# Patient Record
Sex: Female | Born: 1940 | Race: White | Hispanic: No | Marital: Married | Smoking: Never smoker
Health system: Southern US, Community
[De-identification: ages and names within clinical notes are randomized; demographics above are authoritative.]

## PROBLEM LIST (undated history)

## (undated) DIAGNOSIS — K219 Gastro-esophageal reflux disease without esophagitis: Secondary | ICD-10-CM

## (undated) DIAGNOSIS — G43909 Migraine, unspecified, not intractable, without status migrainosus: Secondary | ICD-10-CM

## (undated) DIAGNOSIS — G250 Essential tremor: Secondary | ICD-10-CM

## (undated) DIAGNOSIS — C8590 Non-Hodgkin lymphoma, unspecified, unspecified site: Secondary | ICD-10-CM

## (undated) DIAGNOSIS — J45909 Unspecified asthma, uncomplicated: Secondary | ICD-10-CM

## (undated) DIAGNOSIS — Z860100 Personal history of colon polyps, unspecified: Secondary | ICD-10-CM

## (undated) DIAGNOSIS — C4490 Unspecified malignant neoplasm of skin, unspecified: Secondary | ICD-10-CM

## (undated) DIAGNOSIS — J42 Unspecified chronic bronchitis: Secondary | ICD-10-CM

## (undated) DIAGNOSIS — C439 Malignant melanoma of skin, unspecified: Secondary | ICD-10-CM

## (undated) DIAGNOSIS — I10 Essential (primary) hypertension: Secondary | ICD-10-CM

## (undated) DIAGNOSIS — K2270 Barrett's esophagus without dysplasia: Secondary | ICD-10-CM

## (undated) DIAGNOSIS — J309 Allergic rhinitis, unspecified: Secondary | ICD-10-CM

## (undated) DIAGNOSIS — M1990 Unspecified osteoarthritis, unspecified site: Secondary | ICD-10-CM

## (undated) DIAGNOSIS — B009 Herpesviral infection, unspecified: Secondary | ICD-10-CM

## (undated) DIAGNOSIS — E785 Hyperlipidemia, unspecified: Secondary | ICD-10-CM

## (undated) DIAGNOSIS — C8299 Follicular lymphoma, unspecified, extranodal and solid organ sites: Secondary | ICD-10-CM

## (undated) DIAGNOSIS — T7840XA Allergy, unspecified, initial encounter: Secondary | ICD-10-CM

## (undated) HISTORY — PX: FACIAL COSMETIC SURGERY: SHX629

## (undated) HISTORY — PX: OOPHORECTOMY: SHX86

## (undated) HISTORY — DX: Unspecified osteoarthritis, unspecified site: M19.90

## (undated) HISTORY — DX: Migraine, unspecified, not intractable, without status migrainosus: G43.909

## (undated) HISTORY — DX: Herpesviral infection, unspecified: B00.9

## (undated) HISTORY — PX: ABDOMINAL HYSTERECTOMY: SHX81

## (undated) HISTORY — PX: BASAL CELL CARCINOMA EXCISION: SHX1214

## (undated) HISTORY — PX: CHOLECYSTECTOMY: SHX55

## (undated) HISTORY — DX: Non-Hodgkin lymphoma, unspecified, unspecified site: C85.90

## (undated) HISTORY — PX: OTHER SURGICAL HISTORY: SHX169

## (undated) HISTORY — DX: Barrett's esophagus without dysplasia: K22.70

## (undated) HISTORY — DX: Allergy, unspecified, initial encounter: T78.40XA

## (undated) HISTORY — PX: APPENDECTOMY: SHX54

## (undated) HISTORY — DX: Follicular lymphoma, unspecified, extranodal and solid organ sites: C82.99

## (undated) HISTORY — DX: Unspecified malignant neoplasm of skin, unspecified: C44.90

---

## 2004-07-10 ENCOUNTER — Emergency Department: Payer: Self-pay | Admitting: Emergency Medicine

## 2004-08-18 ENCOUNTER — Ambulatory Visit: Payer: Self-pay | Admitting: Unknown Physician Specialty

## 2004-11-01 ENCOUNTER — Ambulatory Visit: Payer: Self-pay | Admitting: Otolaryngology

## 2005-05-05 ENCOUNTER — Ambulatory Visit: Payer: Self-pay | Admitting: Otolaryngology

## 2005-08-22 ENCOUNTER — Ambulatory Visit: Payer: Self-pay | Admitting: Unknown Physician Specialty

## 2005-09-28 ENCOUNTER — Ambulatory Visit: Payer: Self-pay | Admitting: Gastroenterology

## 2005-10-15 ENCOUNTER — Emergency Department: Payer: Self-pay | Admitting: General Practice

## 2006-10-09 ENCOUNTER — Ambulatory Visit: Payer: Self-pay | Admitting: Unknown Physician Specialty

## 2006-11-12 ENCOUNTER — Ambulatory Visit: Payer: Self-pay | Admitting: Gastroenterology

## 2006-11-19 ENCOUNTER — Ambulatory Visit: Payer: Self-pay | Admitting: Gastroenterology

## 2006-11-21 ENCOUNTER — Ambulatory Visit: Payer: Self-pay | Admitting: Unknown Physician Specialty

## 2007-10-24 ENCOUNTER — Ambulatory Visit: Payer: Self-pay | Admitting: Unknown Physician Specialty

## 2008-11-04 ENCOUNTER — Ambulatory Visit: Payer: Self-pay | Admitting: Unknown Physician Specialty

## 2008-12-03 ENCOUNTER — Ambulatory Visit: Payer: Self-pay | Admitting: Gastroenterology

## 2009-09-29 ENCOUNTER — Ambulatory Visit: Payer: Self-pay

## 2009-11-11 ENCOUNTER — Ambulatory Visit: Payer: Self-pay | Admitting: Unknown Physician Specialty

## 2010-11-30 ENCOUNTER — Ambulatory Visit: Payer: Self-pay | Admitting: Unknown Physician Specialty

## 2011-02-20 ENCOUNTER — Ambulatory Visit: Payer: Self-pay | Admitting: Gastroenterology

## 2011-03-26 ENCOUNTER — Inpatient Hospital Stay: Payer: Self-pay | Admitting: Surgery

## 2011-04-27 ENCOUNTER — Ambulatory Visit: Payer: Self-pay | Admitting: Internal Medicine

## 2011-05-04 ENCOUNTER — Ambulatory Visit: Payer: Self-pay | Admitting: Internal Medicine

## 2011-05-05 ENCOUNTER — Ambulatory Visit: Payer: Self-pay | Admitting: Surgery

## 2011-05-09 ENCOUNTER — Ambulatory Visit: Payer: Self-pay | Admitting: Oncology

## 2011-05-15 ENCOUNTER — Ambulatory Visit: Payer: Self-pay | Admitting: Internal Medicine

## 2011-05-16 ENCOUNTER — Ambulatory Visit: Payer: Self-pay | Admitting: Internal Medicine

## 2011-05-16 LAB — URINALYSIS, COMPLETE
Ketone: NEGATIVE
Leukocyte Esterase: NEGATIVE
Protein: NEGATIVE
RBC,UR: <1 /HPF (ref 0–5)
Specific Gravity: 1.006 (ref 1.003–1.030)
Squamous Epithelial: NONE SEEN
WBC UR: <1 /HPF (ref 0–5)

## 2011-05-23 LAB — URINALYSIS, COMPLETE
Bacteria: NONE SEEN
Bilirubin,UR: NEGATIVE
Blood: NEGATIVE
Ketone: NEGATIVE
Nitrite: NEGATIVE
Ph: 6 (ref 4.5–8.0)
Protein: NEGATIVE
RBC,UR: 1 /HPF (ref 0–5)
Specific Gravity: 1.002 (ref 1.003–1.030)
Squamous Epithelial: <1
Transitional Epi: <1
WBC UR: 2 /HPF (ref 0–5)

## 2011-06-08 ENCOUNTER — Inpatient Hospital Stay: Payer: Self-pay | Admitting: Surgery

## 2011-06-09 ENCOUNTER — Ambulatory Visit: Payer: Self-pay | Admitting: Oncology

## 2011-06-09 LAB — URINALYSIS, COMPLETE
Blood: NEGATIVE
Glucose,UR: NEGATIVE mg/dL (ref 0–75)
Ketone: NEGATIVE
Leukocyte Esterase: NEGATIVE
Nitrite: NEGATIVE
Ph: 6 (ref 4.5–8.0)
Protein: NEGATIVE
RBC,UR: <1 /HPF (ref 0–5)
Specific Gravity: 1.003 (ref 1.003–1.030)
WBC UR: 4 /HPF (ref 0–5)

## 2011-06-09 LAB — CBC WITH DIFFERENTIAL/PLATELET
Basophil %: 0.1 %
Eosinophil #: 0 10*3/uL (ref 0.0–0.7)
Eosinophil %: 0.2 %
HCT: 40 % (ref 35.0–47.0)
HGB: 13.6 g/dL (ref 12.0–16.0)
Lymphocyte #: 0.9 10*3/uL — ABNORMAL LOW (ref 1.0–3.6)
MCH: 30.7 pg (ref 26.0–34.0)
MCHC: 34.1 g/dL (ref 32.0–36.0)
MCV: 90 fL (ref 80–100)
Monocyte #: 0.5 10*3/uL (ref 0.0–0.7)
Neutrophil #: 6.6 10*3/uL — ABNORMAL HIGH (ref 1.4–6.5)
RBC: 4.45 10*6/uL (ref 3.80–5.20)
WBC: 8 10*3/uL (ref 3.6–11.0)

## 2011-06-13 ENCOUNTER — Ambulatory Visit: Payer: Self-pay | Admitting: Internal Medicine

## 2011-06-15 LAB — PATHOLOGY REPORT

## 2011-06-27 LAB — CBC CANCER CENTER
Comment - H1-Com1: NORMAL
Eosinophil #: 0.1 x10 3/mm (ref 0.0–0.7)
Eosinophil %: 2 %
Eosinophil: 3 %
HGB: 14 g/dL (ref 12.0–16.0)
Lymphocyte #: 2.2 x10 3/mm (ref 1.0–3.6)
Lymphocyte %: 46.7 %
Lymphocytes: 51 %
MCHC: 34.3 g/dL (ref 32.0–36.0)
Monocyte #: 0.5 x10 3/mm (ref 0.0–0.7)
Monocytes: 11 %
Neutrophil %: 39.7 %
Platelet: 354 x10 3/mm (ref 150–440)
RBC: 4.59 10*6/uL (ref 3.80–5.20)
RDW: 13.1 % (ref 11.5–14.5)
Segmented Neutrophils: 35 %

## 2011-07-07 ENCOUNTER — Ambulatory Visit: Payer: Self-pay | Admitting: Internal Medicine

## 2011-07-07 ENCOUNTER — Ambulatory Visit: Payer: Self-pay | Admitting: Oncology

## 2011-07-11 LAB — COMPREHENSIVE METABOLIC PANEL
Albumin: 4 g/dL (ref 3.4–5.0)
Alkaline Phosphatase: 77 U/L (ref 50–136)
Calcium, Total: 9.2 mg/dL (ref 8.5–10.1)
Chloride: 103 mmol/L (ref 98–107)
Co2: 30 mmol/L (ref 21–32)
EGFR (Non-African Amer.): >60
Osmolality: 284 (ref 275–301)
Potassium: 4 mmol/L (ref 3.5–5.1)
SGOT(AST): 19 U/L (ref 15–37)
SGPT (ALT): 33 U/L

## 2011-07-11 LAB — CBC CANCER CENTER
Basophil #: 0 x10 3/mm (ref 0.0–0.1)
Basophil %: 0.7 %
Eosinophil #: 0.1 x10 3/mm (ref 0.0–0.7)
HCT: 41.7 % (ref 35.0–47.0)
HGB: 14.3 g/dL (ref 12.0–16.0)
Lymphocyte %: 47.2 %
MCHC: 34.4 g/dL (ref 32.0–36.0)
Monocyte %: 12 %
Neutrophil #: 1.8 x10 3/mm (ref 1.4–6.5)
Neutrophil %: 38.4 %
RBC: 4.68 10*6/uL (ref 3.80–5.20)
RDW: 13.9 % (ref 11.5–14.5)
WBC: 4.7 x10 3/mm (ref 3.6–11.0)

## 2011-07-18 LAB — CBC CANCER CENTER
Eosinophil #: 0.1 x10 3/mm (ref 0.0–0.7)
Eosinophil %: 2.8 %
HCT: 39.7 % (ref 35.0–47.0)
Lymphocyte #: 1.7 x10 3/mm (ref 1.0–3.6)
Lymphocyte %: 38.2 %
MCH: 30.8 pg (ref 26.0–34.0)
MCHC: 35 g/dL (ref 32.0–36.0)
MCV: 88 fL (ref 80–100)
Monocyte #: 0.4 x10 3/mm (ref 0.0–0.7)
Monocyte %: 9.6 %
Neutrophil #: 2.1 x10 3/mm (ref 1.4–6.5)
Platelet: 275 x10 3/mm (ref 150–440)
RBC: 4.52 10*6/uL (ref 3.80–5.20)
RDW: 13.4 % (ref 11.5–14.5)
WBC: 4.4 x10 3/mm (ref 3.6–11.0)

## 2011-07-25 LAB — CBC CANCER CENTER
Basophil #: 0 x10 3/mm (ref 0.0–0.1)
Eosinophil %: 1.7 %
Lymphocyte #: 1.9 x10 3/mm (ref 1.0–3.6)
Lymphocyte %: 39.1 %
MCH: 30.3 pg (ref 26.0–34.0)
MCHC: 34.6 g/dL (ref 32.0–36.0)
MCV: 88 fL (ref 80–100)
Monocyte #: 0.5 x10 3/mm (ref 0.0–0.7)
Monocyte %: 10.7 %
Neutrophil %: 47.6 %
Platelet: 301 x10 3/mm (ref 150–440)
RDW: 13.9 % (ref 11.5–14.5)

## 2011-08-01 LAB — BASIC METABOLIC PANEL
Anion Gap: 10 (ref 7–16)
BUN: 13 mg/dL (ref 7–18)
Calcium, Total: 9 mg/dL (ref 8.5–10.1)
Chloride: 106 mmol/L (ref 98–107)
Creatinine: 0.8 mg/dL (ref 0.60–1.30)
EGFR (African American): >60
Glucose: 124 mg/dL — ABNORMAL HIGH (ref 65–99)
Sodium: 140 mmol/L (ref 136–145)

## 2011-08-01 LAB — CBC CANCER CENTER
Basophil #: 0 x10 3/mm (ref 0.0–0.1)
Basophil %: 0.8 %
Lymphocyte #: 1.7 x10 3/mm (ref 1.0–3.6)
Lymphocyte %: 39.4 %
MCH: 30.1 pg (ref 26.0–34.0)
MCHC: 34.5 g/dL (ref 32.0–36.0)
Monocyte #: 0.5 x10 3/mm (ref 0.0–0.7)
Monocyte %: 11.4 %
Neutrophil #: 2 x10 3/mm (ref 1.4–6.5)
Neutrophil %: 46.1 %
RBC: 4.64 10*6/uL (ref 3.80–5.20)
RDW: 14.2 % (ref 11.5–14.5)

## 2011-08-07 ENCOUNTER — Ambulatory Visit: Payer: Self-pay | Admitting: Internal Medicine

## 2011-08-07 ENCOUNTER — Ambulatory Visit: Payer: Self-pay | Admitting: Oncology

## 2011-11-28 ENCOUNTER — Ambulatory Visit: Payer: Self-pay | Admitting: Oncology

## 2011-11-30 ENCOUNTER — Ambulatory Visit: Payer: Self-pay | Admitting: Oncology

## 2011-11-30 LAB — CBC CANCER CENTER
Basophil #: 0 x10 3/mm (ref 0.0–0.1)
Eosinophil #: 0.1 x10 3/mm (ref 0.0–0.7)
Lymphocyte #: 2 x10 3/mm (ref 1.0–3.6)
Lymphocyte %: 38.2 %
MCHC: 34.9 g/dL (ref 32.0–36.0)
MCV: 88 fL (ref 80–100)
Monocyte #: 0.8 x10 3/mm (ref 0.2–0.9)
Monocyte %: 15.4 %
Neutrophil #: 2.2 x10 3/mm (ref 1.4–6.5)
Neutrophil %: 43.3 %
Platelet: 319 x10 3/mm (ref 150–440)
RBC: 4.79 10*6/uL (ref 3.80–5.20)
RDW: 13.4 % (ref 11.5–14.5)
WBC: 5.2 x10 3/mm (ref 3.6–11.0)

## 2011-11-30 LAB — COMPREHENSIVE METABOLIC PANEL
Albumin: 3.9 g/dL (ref 3.4–5.0)
Alkaline Phosphatase: 79 U/L (ref 50–136)
Anion Gap: 7 (ref 7–16)
BUN: 14 mg/dL (ref 7–18)
Creatinine: 0.91 mg/dL (ref 0.60–1.30)
Osmolality: 283 (ref 275–301)
SGPT (ALT): 39 U/L
Total Protein: 7.4 g/dL (ref 6.4–8.2)

## 2011-11-30 LAB — LACTATE DEHYDROGENASE: LDH: 142 U/L (ref 81–234)

## 2011-11-30 LAB — SEDIMENTATION RATE: Erythrocyte Sed Rate: 7 mm/hr (ref 0–30)

## 2011-12-02 ENCOUNTER — Emergency Department: Payer: Self-pay | Admitting: Emergency Medicine

## 2011-12-02 LAB — COMPREHENSIVE METABOLIC PANEL
Albumin: 4.3 g/dL (ref 3.4–5.0)
Alkaline Phosphatase: 82 U/L (ref 50–136)
Bilirubin,Total: 0.4 mg/dL (ref 0.2–1.0)
Co2: 26 mmol/L (ref 21–32)
Creatinine: 0.95 mg/dL (ref 0.60–1.30)
EGFR (African American): >60
EGFR (Non-African Amer.): >60
Osmolality: 280 (ref 275–301)
Potassium: 3.5 mmol/L (ref 3.5–5.1)
SGPT (ALT): 37 U/L

## 2011-12-02 LAB — CBC WITH DIFFERENTIAL/PLATELET
Eosinophil #: 0.1 10*3/uL (ref 0.0–0.7)
Eosinophil %: 0.9 %
HCT: 41.3 % (ref 35.0–47.0)
MCH: 30.5 pg (ref 26.0–34.0)
MCHC: 35.2 g/dL (ref 32.0–36.0)
MCV: 87 fL (ref 80–100)
Monocyte #: 0.7 x10 3/mm (ref 0.2–0.9)
Monocyte %: 8.4 %
Neutrophil #: 6.1 10*3/uL (ref 1.4–6.5)
Neutrophil %: 69.7 %
Platelet: 307 10*3/uL (ref 150–440)
RDW: 13 % (ref 11.5–14.5)

## 2011-12-05 ENCOUNTER — Ambulatory Visit: Payer: Self-pay | Admitting: Physician Assistant

## 2011-12-07 ENCOUNTER — Ambulatory Visit: Payer: Self-pay | Admitting: Oncology

## 2011-12-22 ENCOUNTER — Emergency Department: Payer: Self-pay | Admitting: *Deleted

## 2012-01-07 ENCOUNTER — Ambulatory Visit: Payer: Self-pay | Admitting: Oncology

## 2012-02-01 LAB — COMPREHENSIVE METABOLIC PANEL
Alkaline Phosphatase: 77 U/L (ref 50–136)
Anion Gap: 8 (ref 7–16)
Bilirubin,Total: 0.4 mg/dL (ref 0.2–1.0)
Chloride: 103 mmol/L (ref 98–107)
Co2: 28 mmol/L (ref 21–32)
Creatinine: 0.97 mg/dL (ref 0.60–1.30)
EGFR (African American): >60
EGFR (Non-African Amer.): 59 — ABNORMAL LOW
Potassium: 3.8 mmol/L (ref 3.5–5.1)
SGOT(AST): 33 U/L (ref 15–37)
SGPT (ALT): 45 U/L (ref 12–78)
Sodium: 139 mmol/L (ref 136–145)
Total Protein: 7.6 g/dL (ref 6.4–8.2)

## 2012-02-01 LAB — CBC CANCER CENTER
Basophil #: 0 x10 3/mm (ref 0.0–0.1)
Basophil %: 0.9 %
Eosinophil %: 2 %
HGB: 14.7 g/dL (ref 12.0–16.0)
Lymphocyte #: 1.7 x10 3/mm (ref 1.0–3.6)
MCH: 29.2 pg (ref 26.0–34.0)
MCV: 89 fL (ref 80–100)
Monocyte #: 0.6 x10 3/mm (ref 0.2–0.9)
Monocyte %: 15 %
Neutrophil %: 37.6 %
RBC: 5.02 10*6/uL (ref 3.80–5.20)
WBC: 3.8 x10 3/mm (ref 3.6–11.0)

## 2012-02-06 ENCOUNTER — Ambulatory Visit: Payer: Self-pay | Admitting: Oncology

## 2012-03-20 DIAGNOSIS — K1120 Sialoadenitis, unspecified: Secondary | ICD-10-CM | POA: Insufficient documentation

## 2012-03-20 DIAGNOSIS — M26609 Unspecified temporomandibular joint disorder, unspecified side: Secondary | ICD-10-CM | POA: Insufficient documentation

## 2012-04-11 ENCOUNTER — Ambulatory Visit: Payer: Self-pay | Admitting: Oncology

## 2012-04-11 LAB — CBC CANCER CENTER
Eosinophil #: 0.1 x10 3/mm (ref 0.0–0.7)
Eosinophil %: 3.4 %
HCT: 41.1 % (ref 35.0–47.0)
HGB: 14.3 g/dL (ref 12.0–16.0)
Lymphocyte %: 35 %
MCH: 30.3 pg (ref 26.0–34.0)
MCV: 87 fL (ref 80–100)
Monocyte %: 14.7 %
Neutrophil %: 45.7 %
Platelet: 307 x10 3/mm (ref 150–440)
RBC: 4.73 10*6/uL (ref 3.80–5.20)
RDW: 13.5 % (ref 11.5–14.5)

## 2012-04-11 LAB — COMPREHENSIVE METABOLIC PANEL
Albumin: 3.7 g/dL (ref 3.4–5.0)
Anion Gap: 10 (ref 7–16)
BUN: 13 mg/dL (ref 7–18)
Bilirubin,Total: 0.3 mg/dL (ref 0.2–1.0)
Chloride: 106 mmol/L (ref 98–107)
Creatinine: 0.9 mg/dL (ref 0.60–1.30)
Glucose: 125 mg/dL — ABNORMAL HIGH (ref 65–99)
Potassium: 4 mmol/L (ref 3.5–5.1)
SGOT(AST): 24 U/L (ref 15–37)
SGPT (ALT): 41 U/L (ref 12–78)
Sodium: 141 mmol/L (ref 136–145)
Total Protein: 7 g/dL (ref 6.4–8.2)

## 2012-04-11 LAB — LACTATE DEHYDROGENASE: LDH: 142 U/L (ref 81–246)

## 2012-05-08 ENCOUNTER — Ambulatory Visit: Payer: Self-pay | Admitting: Oncology

## 2012-06-08 ENCOUNTER — Ambulatory Visit: Payer: Self-pay | Admitting: Oncology

## 2012-06-13 LAB — CBC CANCER CENTER
Basophil #: 0.1 x10 3/mm (ref 0.0–0.1)
Basophil %: 1.1 %
Eosinophil #: 0.1 x10 3/mm (ref 0.0–0.7)
Eosinophil %: 1.8 %
HGB: 14.9 g/dL (ref 12.0–16.0)
Lymphocyte #: 1.6 x10 3/mm (ref 1.0–3.6)
MCHC: 34.7 g/dL (ref 32.0–36.0)
Monocyte #: 0.5 x10 3/mm (ref 0.2–0.9)
Monocyte %: 10 %
Neutrophil #: 3 x10 3/mm (ref 1.4–6.5)
Neutrophil %: 56.2 %
Platelet: 290 x10 3/mm (ref 150–440)

## 2012-06-13 LAB — COMPREHENSIVE METABOLIC PANEL
Albumin: 3.9 g/dL (ref 3.4–5.0)
Alkaline Phosphatase: 79 U/L (ref 50–136)
BUN: 14 mg/dL (ref 7–18)
Bilirubin,Total: 0.5 mg/dL (ref 0.2–1.0)
Creatinine: 0.83 mg/dL (ref 0.60–1.30)
EGFR (African American): >60
EGFR (Non-African Amer.): >60
Osmolality: 285 (ref 275–301)
Sodium: 142 mmol/L (ref 136–145)

## 2012-07-06 ENCOUNTER — Ambulatory Visit: Payer: Self-pay | Admitting: Oncology

## 2012-08-06 ENCOUNTER — Ambulatory Visit: Payer: Self-pay | Admitting: Oncology

## 2012-08-13 ENCOUNTER — Ambulatory Visit: Payer: Self-pay | Admitting: Oncology

## 2012-08-15 LAB — COMPREHENSIVE METABOLIC PANEL
Albumin: 3.8 g/dL (ref 3.4–5.0)
Alkaline Phosphatase: 84 U/L (ref 50–136)
BUN: 16 mg/dL (ref 7–18)
Co2: 30 mmol/L (ref 21–32)
Creatinine: 1.04 mg/dL (ref 0.60–1.30)
EGFR (Non-African Amer.): 54 — ABNORMAL LOW
Glucose: 127 mg/dL — ABNORMAL HIGH (ref 65–99)
Potassium: 3.9 mmol/L (ref 3.5–5.1)
Sodium: 141 mmol/L (ref 136–145)
Total Protein: 7.3 g/dL (ref 6.4–8.2)

## 2012-08-15 LAB — CBC CANCER CENTER
Basophil #: 0.1 x10 3/mm (ref 0.0–0.1)
Basophil %: 1.2 %
Eosinophil #: 0.1 x10 3/mm (ref 0.0–0.7)
Eosinophil %: 2.2 %
HCT: 43.3 % (ref 35.0–47.0)
HGB: 14.6 g/dL (ref 12.0–16.0)
Lymphocyte #: 1.8 x10 3/mm (ref 1.0–3.6)
Lymphocyte %: 39.2 %
MCH: 29.2 pg (ref 26.0–34.0)
MCHC: 33.8 g/dL (ref 32.0–36.0)
Monocyte #: 0.6 x10 3/mm (ref 0.2–0.9)
Neutrophil #: 2 x10 3/mm (ref 1.4–6.5)
Platelet: 295 x10 3/mm (ref 150–440)
RBC: 5 10*6/uL (ref 3.80–5.20)
RDW: 13.3 % (ref 11.5–14.5)
WBC: 4.5 x10 3/mm (ref 3.6–11.0)

## 2012-08-23 ENCOUNTER — Emergency Department: Payer: Self-pay | Admitting: Emergency Medicine

## 2012-08-23 LAB — CBC
HGB: 15.7 g/dL (ref 12.0–16.0)
MCH: 30.2 pg (ref 26.0–34.0)
MCHC: 34.7 g/dL (ref 32.0–36.0)
MCV: 87 fL (ref 80–100)
Platelet: 296 10*3/uL (ref 150–440)
RBC: 5.19 10*6/uL (ref 3.80–5.20)
RDW: 13.1 % (ref 11.5–14.5)

## 2012-08-23 LAB — URINALYSIS, COMPLETE
Bilirubin,UR: NEGATIVE
Ketone: NEGATIVE
Nitrite: POSITIVE
Ph: 5 (ref 4.5–8.0)
RBC,UR: 17 /HPF (ref 0–5)
Specific Gravity: 1.006 (ref 1.003–1.030)
Squamous Epithelial: 2

## 2012-08-23 LAB — COMPREHENSIVE METABOLIC PANEL
Alkaline Phosphatase: 78 U/L (ref 50–136)
BUN: 11 mg/dL (ref 7–18)
Calcium, Total: 8.8 mg/dL (ref 8.5–10.1)
Chloride: 105 mmol/L (ref 98–107)
Creatinine: 0.77 mg/dL (ref 0.60–1.30)
Glucose: 107 mg/dL — ABNORMAL HIGH (ref 65–99)
Osmolality: 270 (ref 275–301)
Potassium: 3.8 mmol/L (ref 3.5–5.1)
SGPT (ALT): 41 U/L (ref 12–78)
Sodium: 135 mmol/L — ABNORMAL LOW (ref 136–145)
Total Protein: 7.4 g/dL (ref 6.4–8.2)

## 2012-08-23 LAB — LIPASE, BLOOD: Lipase: 115 U/L (ref 73–393)

## 2012-08-27 LAB — STOOL CULTURE

## 2012-09-05 ENCOUNTER — Ambulatory Visit: Payer: Self-pay | Admitting: Oncology

## 2012-10-06 ENCOUNTER — Ambulatory Visit: Payer: Self-pay | Admitting: Oncology

## 2012-10-17 LAB — CBC CANCER CENTER
Basophil #: 0 x10 3/mm (ref 0.0–0.1)
Basophil %: 1.2 %
Lymphocyte %: 41.3 %
MCH: 30.3 pg (ref 26.0–34.0)
MCV: 86 fL (ref 80–100)
Monocyte %: 11.8 %
Neutrophil #: 1.8 x10 3/mm (ref 1.4–6.5)
RBC: 4.91 10*6/uL (ref 3.80–5.20)
WBC: 4.1 x10 3/mm (ref 3.6–11.0)

## 2012-10-17 LAB — COMPREHENSIVE METABOLIC PANEL
Albumin: 4 g/dL (ref 3.4–5.0)
Alkaline Phosphatase: 81 U/L (ref 50–136)
BUN: 18 mg/dL (ref 7–18)
Bilirubin,Total: 0.4 mg/dL (ref 0.2–1.0)
Calcium, Total: 9.3 mg/dL (ref 8.5–10.1)
Chloride: 103 mmol/L (ref 98–107)
Co2: 28 mmol/L (ref 21–32)
EGFR (African American): >60
Glucose: 119 mg/dL — ABNORMAL HIGH (ref 65–99)
Osmolality: 282 (ref 275–301)
Potassium: 4.3 mmol/L (ref 3.5–5.1)
SGPT (ALT): 44 U/L (ref 12–78)
Total Protein: 7.3 g/dL (ref 6.4–8.2)

## 2012-10-17 LAB — LACTATE DEHYDROGENASE: LDH: 171 U/L (ref 81–246)

## 2012-11-05 ENCOUNTER — Ambulatory Visit: Payer: Self-pay | Admitting: Oncology

## 2012-12-06 ENCOUNTER — Ambulatory Visit: Payer: Self-pay | Admitting: Oncology

## 2012-12-19 LAB — COMPREHENSIVE METABOLIC PANEL
Albumin: 4.1 g/dL (ref 3.4–5.0)
Alkaline Phosphatase: 87 U/L (ref 50–136)
Anion Gap: 6 — ABNORMAL LOW (ref 7–16)
BUN: 16 mg/dL (ref 7–18)
Calcium, Total: 9.1 mg/dL (ref 8.5–10.1)
Chloride: 103 mmol/L (ref 98–107)
Creatinine: 0.98 mg/dL (ref 0.60–1.30)
EGFR (African American): >60
EGFR (Non-African Amer.): 58 — ABNORMAL LOW
Glucose: 119 mg/dL — ABNORMAL HIGH (ref 65–99)
Potassium: 4.2 mmol/L (ref 3.5–5.1)
SGOT(AST): 23 U/L (ref 15–37)
SGPT (ALT): 40 U/L (ref 12–78)
Sodium: 140 mmol/L (ref 136–145)
Total Protein: 7.3 g/dL (ref 6.4–8.2)

## 2012-12-19 LAB — CBC CANCER CENTER
Basophil #: 0.1 x10 3/mm (ref 0.0–0.1)
Basophil %: 1.9 %
Eosinophil #: 0.1 x10 3/mm (ref 0.0–0.7)
Eosinophil %: 2.2 %
HCT: 42.3 % (ref 35.0–47.0)
HGB: 15.2 g/dL (ref 12.0–16.0)
Lymphocyte %: 36 %
MCH: 31 pg (ref 26.0–34.0)
MCV: 86 fL (ref 80–100)
Monocyte %: 12 %
Neutrophil #: 2 x10 3/mm (ref 1.4–6.5)
Platelet: 298 x10 3/mm (ref 150–440)
RBC: 4.92 10*6/uL (ref 3.80–5.20)
RDW: 13.6 % (ref 11.5–14.5)
WBC: 4.1 x10 3/mm (ref 3.6–11.0)

## 2013-01-06 ENCOUNTER — Ambulatory Visit: Payer: Self-pay | Admitting: Oncology

## 2013-02-12 ENCOUNTER — Ambulatory Visit: Payer: Self-pay | Admitting: Oncology

## 2013-02-12 LAB — LACTATE DEHYDROGENASE: LDH: 153 U/L (ref 81–246)

## 2013-02-12 LAB — COMPREHENSIVE METABOLIC PANEL
Albumin: 3.9 g/dL (ref 3.4–5.0)
Anion Gap: 7 (ref 7–16)
Bilirubin,Total: 0.4 mg/dL (ref 0.2–1.0)
Calcium, Total: 8.7 mg/dL (ref 8.5–10.1)
EGFR (Non-African Amer.): >60
Osmolality: 284 (ref 275–301)
Potassium: 4.5 mmol/L (ref 3.5–5.1)
Sodium: 141 mmol/L (ref 136–145)
Total Protein: 7.2 g/dL (ref 6.4–8.2)

## 2013-02-12 LAB — CBC CANCER CENTER
Basophil #: 0.1 x10 3/mm (ref 0.0–0.1)
Basophil %: 1 %
Eosinophil %: 3.5 %
HCT: 43.2 % (ref 35.0–47.0)
HGB: 14.9 g/dL (ref 12.0–16.0)
Lymphocyte #: 1.7 x10 3/mm (ref 1.0–3.6)
MCH: 30.3 pg (ref 26.0–34.0)
MCV: 88 fL (ref 80–100)
Monocyte #: 0.8 x10 3/mm (ref 0.2–0.9)
Monocyte %: 14.2 %
Neutrophil #: 2.8 x10 3/mm (ref 1.4–6.5)
Neutrophil %: 50.7 %
Platelet: 318 x10 3/mm (ref 150–440)

## 2013-03-07 ENCOUNTER — Ambulatory Visit: Payer: Self-pay | Admitting: Gastroenterology

## 2013-03-08 ENCOUNTER — Ambulatory Visit: Payer: Self-pay | Admitting: Oncology

## 2013-04-24 ENCOUNTER — Ambulatory Visit: Payer: Self-pay | Admitting: Oncology

## 2013-04-24 LAB — LACTATE DEHYDROGENASE: LDH: 185 U/L (ref 81–246)

## 2013-04-24 LAB — COMPREHENSIVE METABOLIC PANEL
Albumin: 4 g/dL (ref 3.4–5.0)
Alkaline Phosphatase: 74 U/L
Anion Gap: 7 (ref 7–16)
BUN: 15 mg/dL (ref 7–18)
Creatinine: 0.91 mg/dL (ref 0.60–1.30)
EGFR (African American): >60
Potassium: 4 mmol/L (ref 3.5–5.1)
SGOT(AST): 32 U/L (ref 15–37)
SGPT (ALT): 49 U/L (ref 12–78)
Sodium: 139 mmol/L (ref 136–145)
Total Protein: 7.5 g/dL (ref 6.4–8.2)

## 2013-04-24 LAB — CBC CANCER CENTER
Basophil #: 0 x10 3/mm (ref 0.0–0.1)
Basophil %: 0.7 %
Eosinophil #: 0.1 x10 3/mm (ref 0.0–0.7)
Eosinophil %: 2.2 %
HCT: 44.1 % (ref 35.0–47.0)
Lymphocyte #: 2.3 x10 3/mm (ref 1.0–3.6)
Lymphocyte %: 39.8 %
MCV: 88 fL (ref 80–100)
Monocyte #: 0.9 x10 3/mm (ref 0.2–0.9)
Neutrophil #: 2.5 x10 3/mm (ref 1.4–6.5)
RBC: 5.02 10*6/uL (ref 3.80–5.20)
RDW: 13.3 % (ref 11.5–14.5)

## 2013-05-08 ENCOUNTER — Ambulatory Visit: Payer: Self-pay | Admitting: Oncology

## 2013-05-30 ENCOUNTER — Ambulatory Visit: Payer: Self-pay | Admitting: Physician Assistant

## 2013-06-13 ENCOUNTER — Ambulatory Visit: Payer: Self-pay | Admitting: Physical Medicine and Rehabilitation

## 2013-06-25 ENCOUNTER — Ambulatory Visit: Payer: Self-pay | Admitting: Oncology

## 2013-06-26 LAB — COMPREHENSIVE METABOLIC PANEL
Albumin: 4 g/dL (ref 3.4–5.0)
Alkaline Phosphatase: 71 U/L
Anion Gap: 10 (ref 7–16)
BILIRUBIN TOTAL: 0.4 mg/dL (ref 0.2–1.0)
BUN: 17 mg/dL (ref 7–18)
CALCIUM: 8.6 mg/dL (ref 8.5–10.1)
CREATININE: 0.87 mg/dL (ref 0.60–1.30)
Chloride: 103 mmol/L (ref 98–107)
Co2: 27 mmol/L (ref 21–32)
EGFR (African American): >60
EGFR (Non-African Amer.): >60
Glucose: 152 mg/dL — ABNORMAL HIGH (ref 65–99)
Osmolality: 284 (ref 275–301)
POTASSIUM: 4 mmol/L (ref 3.5–5.1)
SGOT(AST): 24 U/L (ref 15–37)
SGPT (ALT): 42 U/L (ref 12–78)
Sodium: 140 mmol/L (ref 136–145)
Total Protein: 7.3 g/dL (ref 6.4–8.2)

## 2013-06-26 LAB — CBC CANCER CENTER
BASOS PCT: 0.8 %
Basophil #: 0.1 x10 3/mm (ref 0.0–0.1)
Eosinophil #: 0.1 x10 3/mm (ref 0.0–0.7)
Eosinophil %: 1.4 %
HCT: 44.5 % (ref 35.0–47.0)
HGB: 15 g/dL (ref 12.0–16.0)
LYMPHS PCT: 27.3 %
Lymphocyte #: 1.8 x10 3/mm (ref 1.0–3.6)
MCH: 29.6 pg (ref 26.0–34.0)
MCHC: 33.6 g/dL (ref 32.0–36.0)
MCV: 88 fL (ref 80–100)
MONO ABS: 0.6 x10 3/mm (ref 0.2–0.9)
MONOS PCT: 9.7 %
NEUTROS PCT: 60.8 %
Neutrophil #: 3.9 x10 3/mm (ref 1.4–6.5)
PLATELETS: 335 x10 3/mm (ref 150–440)
RBC: 5.05 10*6/uL (ref 3.80–5.20)
RDW: 13.7 % (ref 11.5–14.5)
WBC: 6.4 x10 3/mm (ref 3.6–11.0)

## 2013-07-06 ENCOUNTER — Ambulatory Visit: Payer: Self-pay | Admitting: Oncology

## 2013-08-28 ENCOUNTER — Ambulatory Visit: Payer: Self-pay | Admitting: Oncology

## 2013-09-01 ENCOUNTER — Ambulatory Visit: Payer: Self-pay | Admitting: Oncology

## 2013-09-02 LAB — COMPREHENSIVE METABOLIC PANEL
ALBUMIN: 3.8 g/dL (ref 3.4–5.0)
AST: 18 U/L (ref 15–37)
Alkaline Phosphatase: 80 U/L
Anion Gap: 10 (ref 7–16)
BILIRUBIN TOTAL: 0.3 mg/dL (ref 0.2–1.0)
BUN: 15 mg/dL (ref 7–18)
CALCIUM: 9.2 mg/dL (ref 8.5–10.1)
CO2: 26 mmol/L (ref 21–32)
Chloride: 108 mmol/L — ABNORMAL HIGH (ref 98–107)
Creatinine: 0.9 mg/dL (ref 0.60–1.30)
Glucose: 116 mg/dL — ABNORMAL HIGH (ref 65–99)
Osmolality: 289 (ref 275–301)
POTASSIUM: 3.8 mmol/L (ref 3.5–5.1)
SGPT (ALT): 35 U/L (ref 12–78)
Sodium: 144 mmol/L (ref 136–145)
TOTAL PROTEIN: 6.9 g/dL (ref 6.4–8.2)

## 2013-09-02 LAB — CBC CANCER CENTER
Basophil #: 0.1 x10 3/mm (ref 0.0–0.1)
Basophil %: 1.1 %
Eosinophil #: 0.1 x10 3/mm (ref 0.0–0.7)
Eosinophil %: 2.1 %
HCT: 41 % (ref 35.0–47.0)
HGB: 14 g/dL (ref 12.0–16.0)
LYMPHS ABS: 1.8 x10 3/mm (ref 1.0–3.6)
Lymphocyte %: 33.3 %
MCH: 29.7 pg (ref 26.0–34.0)
MCHC: 34.2 g/dL (ref 32.0–36.0)
MCV: 87 fL (ref 80–100)
MONO ABS: 0.7 x10 3/mm (ref 0.2–0.9)
MONOS PCT: 13.8 %
NEUTROS PCT: 49.7 %
Neutrophil #: 2.7 x10 3/mm (ref 1.4–6.5)
PLATELETS: 293 x10 3/mm (ref 150–440)
RBC: 4.72 10*6/uL (ref 3.80–5.20)
RDW: 13.4 % (ref 11.5–14.5)
WBC: 5.4 x10 3/mm (ref 3.6–11.0)

## 2013-09-02 LAB — LACTATE DEHYDROGENASE: LDH: 141 U/L (ref 81–246)

## 2013-09-05 ENCOUNTER — Ambulatory Visit: Payer: Self-pay | Admitting: Oncology

## 2013-11-04 ENCOUNTER — Ambulatory Visit: Payer: Self-pay | Admitting: Oncology

## 2013-11-04 LAB — CBC CANCER CENTER
BASOS PCT: 1 %
Basophil #: 0.1 x10 3/mm (ref 0.0–0.1)
EOS ABS: 0.1 x10 3/mm (ref 0.0–0.7)
EOS PCT: 1.6 %
HCT: 42.9 % (ref 35.0–47.0)
HGB: 14.5 g/dL (ref 12.0–16.0)
LYMPHS PCT: 39.4 %
Lymphocyte #: 1.9 x10 3/mm (ref 1.0–3.6)
MCH: 29.9 pg (ref 26.0–34.0)
MCHC: 33.9 g/dL (ref 32.0–36.0)
MCV: 88 fL (ref 80–100)
MONOS PCT: 12 %
Monocyte #: 0.6 x10 3/mm (ref 0.2–0.9)
NEUTROS PCT: 46 %
Neutrophil #: 2.2 x10 3/mm (ref 1.4–6.5)
PLATELETS: 294 x10 3/mm (ref 150–440)
RBC: 4.86 10*6/uL (ref 3.80–5.20)
RDW: 13.2 % (ref 11.5–14.5)
WBC: 4.9 x10 3/mm (ref 3.6–11.0)

## 2013-11-04 LAB — LACTATE DEHYDROGENASE: LDH: 167 U/L (ref 81–246)

## 2013-11-05 ENCOUNTER — Ambulatory Visit: Payer: Self-pay | Admitting: Oncology

## 2013-12-06 HISTORY — PX: BREAST BIOPSY: SHX20

## 2013-12-10 ENCOUNTER — Ambulatory Visit: Payer: Self-pay | Admitting: Physician Assistant

## 2013-12-10 DIAGNOSIS — E785 Hyperlipidemia, unspecified: Secondary | ICD-10-CM | POA: Insufficient documentation

## 2013-12-16 ENCOUNTER — Ambulatory Visit: Payer: Self-pay | Admitting: Oncology

## 2013-12-30 ENCOUNTER — Ambulatory Visit: Payer: Self-pay | Admitting: Oncology

## 2013-12-31 LAB — PATHOLOGY REPORT

## 2014-01-01 ENCOUNTER — Ambulatory Visit: Payer: Self-pay | Admitting: Oncology

## 2014-01-06 ENCOUNTER — Ambulatory Visit: Payer: Self-pay | Admitting: Oncology

## 2014-02-05 ENCOUNTER — Ambulatory Visit: Payer: Self-pay | Admitting: Oncology

## 2014-03-03 ENCOUNTER — Ambulatory Visit: Payer: Self-pay | Admitting: Oncology

## 2014-03-03 LAB — COMPREHENSIVE METABOLIC PANEL
AST: 29 U/L (ref 15–37)
Albumin: 4.1 g/dL (ref 3.4–5.0)
Alkaline Phosphatase: 81 U/L
Anion Gap: 8 (ref 7–16)
BUN: 18 mg/dL (ref 7–18)
Bilirubin,Total: 0.4 mg/dL (ref 0.2–1.0)
Calcium, Total: 9.4 mg/dL (ref 8.5–10.1)
Chloride: 105 mmol/L (ref 98–107)
Co2: 28 mmol/L (ref 21–32)
Creatinine: 0.94 mg/dL (ref 0.60–1.30)
GLUCOSE: 112 mg/dL — AB (ref 65–99)
OSMOLALITY: 284 (ref 275–301)
POTASSIUM: 4.1 mmol/L (ref 3.5–5.1)
SGPT (ALT): 53 U/L
SODIUM: 141 mmol/L (ref 136–145)
Total Protein: 7.4 g/dL (ref 6.4–8.2)

## 2014-03-03 LAB — CBC CANCER CENTER
BASOS PCT: 1.2 %
Basophil #: 0.1 x10 3/mm (ref 0.0–0.1)
EOS ABS: 0.1 x10 3/mm (ref 0.0–0.7)
EOS PCT: 1.4 %
HCT: 44.2 % (ref 35.0–47.0)
HGB: 15 g/dL (ref 12.0–16.0)
LYMPHS ABS: 2.1 x10 3/mm (ref 1.0–3.6)
Lymphocyte %: 41.3 %
MCH: 30.5 pg (ref 26.0–34.0)
MCHC: 34 g/dL (ref 32.0–36.0)
MCV: 90 fL (ref 80–100)
MONO ABS: 0.6 x10 3/mm (ref 0.2–0.9)
Monocyte %: 12.4 %
NEUTROS PCT: 43.7 %
Neutrophil #: 2.2 x10 3/mm (ref 1.4–6.5)
Platelet: 295 x10 3/mm (ref 150–440)
RBC: 4.94 10*6/uL (ref 3.80–5.20)
RDW: 13.3 % (ref 11.5–14.5)
WBC: 5.1 x10 3/mm (ref 3.6–11.0)

## 2014-03-03 LAB — LACTATE DEHYDROGENASE: LDH: 145 U/L (ref 81–246)

## 2014-03-08 ENCOUNTER — Ambulatory Visit: Payer: Self-pay | Admitting: Oncology

## 2014-06-02 DIAGNOSIS — M1990 Unspecified osteoarthritis, unspecified site: Secondary | ICD-10-CM | POA: Insufficient documentation

## 2014-06-02 DIAGNOSIS — C8590 Non-Hodgkin lymphoma, unspecified, unspecified site: Secondary | ICD-10-CM | POA: Insufficient documentation

## 2014-06-02 DIAGNOSIS — Z860100 Personal history of colon polyps, unspecified: Secondary | ICD-10-CM

## 2014-06-02 DIAGNOSIS — K2270 Barrett's esophagus without dysplasia: Secondary | ICD-10-CM | POA: Insufficient documentation

## 2014-06-02 DIAGNOSIS — J45909 Unspecified asthma, uncomplicated: Secondary | ICD-10-CM | POA: Insufficient documentation

## 2014-06-02 DIAGNOSIS — J309 Allergic rhinitis, unspecified: Secondary | ICD-10-CM | POA: Insufficient documentation

## 2014-06-02 DIAGNOSIS — B009 Herpesviral infection, unspecified: Secondary | ICD-10-CM | POA: Insufficient documentation

## 2014-06-02 DIAGNOSIS — G250 Essential tremor: Secondary | ICD-10-CM | POA: Insufficient documentation

## 2014-06-02 DIAGNOSIS — K219 Gastro-esophageal reflux disease without esophagitis: Secondary | ICD-10-CM | POA: Insufficient documentation

## 2014-06-02 DIAGNOSIS — J42 Unspecified chronic bronchitis: Secondary | ICD-10-CM | POA: Insufficient documentation

## 2014-06-02 HISTORY — DX: Personal history of colonic polyps: Z86.010

## 2014-06-02 HISTORY — DX: Personal history of colon polyps, unspecified: Z86.0100

## 2014-08-30 NOTE — Op Note (Signed)
PATIENT NAME:  [name], [name] DATE OF BIRTH:  04-02-1941  DATE OF PROCEDURE:  06/08/2011  PREOPERATIVE DIAGNOSIS: Chronic cholecystitis, cholelithiasis, retroperitoneal adenopathy.  POSTOPERATIVE DIAGNOSIS: Chronic cholecystitis, cholelithiasis, retroperitoneal adenopathy.  PROCEDURE: Laparoscopic cholecystectomy, laparotomy with excision of retroperitoneal lymph node.   SURGEON:  Rochel Brome, M.D.   ASSISTANT: Britta Mccreedy, PA   ANESTHESIA: General.   INDICATIONS: This 74 year old female recently presented with right upper quadrant pains.  She had had a CT scan during her earlier hospital admission which demonstrated gallstones but also demonstrated extensive retroperitoneal adenopathy.  I sent her for oncology consultation and subsequently determined that she would need to have biopsy of the retroperitoneal lymph node. The gallbladder was to be removed with laparoscopy to decrease the total length of her laparotomy incision, and also possibly to identify a lymph node which could be removed with the laparoscope.  DESCRIPTION OF THE PROCEDURE: The patient was placed on the operating table in the supine position under general endotracheal anesthesia. The abdomen was prepared with ChloraPrep and draped in a sterile manner.   A short incision was made below the umbilicus, and carried down to the deep fascia which was grasped with a laryngeal hook and elevated. A Veress needle was inserted, aspirated, and irrigated with a saline solution. Next, the peritoneal cavity was inflated with carbon dioxide. The Veress needle was removed. The 10-mm cannula was inserted. The 10-mm 0-degree laparoscope was inserted to view the peritoneal cavity. Initial inspection revealed there was evidence of a mass in the retroperitoneum as there was abnormal appearance of the fatty tissue between loops of bowel in the mid aspect of the abdomen. The liver itself appeared normal. Another incision was made in  the epigastrium just to the right of the midline to introduce an 11-mm cannula. Two incisions were made in the lateral aspect of the right upper quadrant to introduce two 5-mm cannulas.   Next, further inspection was carried out as graspers were used to manipulate the omentum up over the stomach and could further identify the small bowel. Numerous loops of small bowel were reflected, examining the mesentery and at this point did not identify a specific lymph node that could be safely removed with laparoscopy but just could tell that there was a large area of retroperitoneal fullness.   Next the laparoscopic cholecystectomy was done. The gallbladder was retracted towards the right shoulder. The Hartman's pouch was retracted inferiorly and laterally. A number of adhesions were taken down with blunt and sharp dissection and the use of electrocautery. The porta hepatis was demonstrated. The cystic duct was dissected free from surrounding structures. The cystic artery was dissected free from surrounding structures. The neck of the gallbladder was mobilized with incision of the visceral peritoneum. A critical view of safety was demonstrated. An endoclip was placed across the cystic duct adjacent to the neck of the gallbladder. An incision was made in the cystic duct to introduce a Reddick catheter; however, the Reddick catheter would not thread beyond about 4 to 5 mm. Therefore a cholangiogram was not done. The Reddick catheter was removed. The cystic duct was doubly ligated with endoclips and divided. The cystic artery was controlled with double endoclips and divided. The gallbladder was dissected free from the liver with hook and cautery and was completely separated. Hemostasis was intact. Next, the gallbladder was held with a 5-mm grasper through the uppermost 5-mm port site. The other cannulas were removed, allowing carbon dioxide to escape from the peritoneal cavity.  An upper abdominal midline incision was made  overlying the site of the retroperitoneal fullness and dissection was carried down through subcutaneous tissues and through the linea alba and the peritoneal cavity was opened. The gallbladder was then delivered out and submitted with palpable stone for routine pathology.   Next the small bowel was examined. There was one loop of bowel that appeared to have just some mild abnormality in its shape, suggesting it could have been involved in past history of small bowel obstruction possibly related to adhesions but there was no obstruction identified at this time as the small bowel appeared to have a normal caliber. There was a large area of palpable adenopathy in the mid aspect of the abdomen. There were multiple lymph nodes as large as 3 to 4 cm. There was a lymph node which was identified on the left side deep in the root of the small bowel mesentery which appeared to be about 12 to 14 mm in dimension. There was no significant pulse adjacent to this lymph node. It appeared that this would be the safest one to remove without compromising any of the blood supply of the small bowel. An incision was made in the retroperitoneum approximately 3 cm in length and dissected down through some fatty tissue to encounter a lymph node which appeared to be about 12 mm in size. With  blunt dissection and also use of electrocautery this lymph node was dissected free from surrounding structures. It was submitted fresh for pathology and imprint and lymphoma evaluation.  The  wound in the retroperitoneum was examined. Several tiny bleeding points were cauterized. This was watched for a period of time and subsequently determined hemostasis was intact. The retroperitoneum was closed with 4-0 chromic.  Other than the diffuse adenopathy there was no other specific finding in this immediate area. Next, the loops of bowel were returned to the abdominal cavity and omentum was brought beneath the wound. The midline fascia was closed with  interrupted 0 Maxon figure-of-eight sutures.   Near the end of this closure the pathologist called and indicated that there were giant cells seen in the imprint and this appeared to be satisfactory specimen for pathological studies and flow cytometry.   Next, the remainder of the midline fascia was closed with 0 Maxon figure-of-eight sutures. Next, the wound was closed with clips and the laparoscopic incisions were closed with 4-0 chromic subcuticular sutures, benzoin, and Steri-Strips. Dressings were applied with paper tape. The patient tolerated surgery satisfactorily and was then prepared for transfer to the recovery room.    ____________________________ [name]. Rochel Brome, MD jws:bjt D: 06/08/2011 12:10:55 ET T: 06/08/2011 12:47:23 ET JOB#: [id number]  cc: Loreli Dollar, MD, <Dictator> Loreli Dollar MD ELECTRONICALLY SIGNED 06/11/2011 15:21

## 2014-08-30 NOTE — Discharge Summary (Signed)
PATIENT NAME:  [name], [name] MR#:  [id number] DATE OF BIRTH:  12-27-1940  DATE OF ADMISSION:  06/08/2011 DATE OF DISCHARGE:  06/12/2011  ADMITTING PHYSICIAN: Dr. Rochel Brome.   ADMITTING DIAGNOSIS: Chronic cholecystitis with cholelithiasis, retroperitoneal adenopathy.   DISCHARGE DIAGNOSIS: Chronic cholecystitis with cholelithiasis, retroperitoneal adenopathy.   PROCEDURE: Laparoscopic cholecystectomy, laparotomy with excision of retroperitoneal lymph node.   HISTORY OF PRESENT ILLNESS: This is a 74 year old female with CT findings of gallstones and extensive retroperitoneal adenopathy. She has had oncology consultation and was recommended to have biopsy of one of the lymph nodes for diagnostic purposes.   HOSPITAL COURSE: The patient underwent the above procedure on 06/08/2011. She did well with the surgery and was admitted to the floor after recovery. She had IV pain medication which she used for the first 24 hours. She had significant pain which led to anxiety in the first 24 hours. She was given Toradol on postoperative day #1, which significantly helped with her pain. She was able to transition off of the IV pain medication and only used Norco. She had some bleeding from the incision the first time she got up the evening after surgery. This did stop in the first 12 hours. She complained of frequent urination and had just recently been treated for a urinary tract infection. Urine culture was therefore checked while she was in the hospital. Results suggested contamination. The patient was able to advance her diet to regular diet over the course of three days. On the day of discharge she was feeling much better. Pain was controlled with Norco. She was tolerating a regular diet, had a bowel movement. Abdomen was soft with minimal tenderness. Vital signs stable. The discharge plan was discussed with the patient.   MEDICATIONS:  1. Aspirin 81 mg daily.  2. Advair Diskus 100/50 mcg 2 puffs daily.   3. Ventolin HFA once orally. 4. Omeprazole 20 mg daily.  5. Loratadine 10 mg daily.  6. Multivitamin 1 tab daily.  7. Norco 5/325, one to two as needed every four hours.  8. Stool softener as needed for constipation.   DISCHARGE INSTRUCTIONS:  1. Replace dressing as necessary.  2. May shower.  3. Regular diet.  4. No exertional activity or heavy lifting.  5. Follow-up appointment with Dr. Oliva Bustard and the surgery department next Monday.  6. Seek medical attention for any increasing abdominal pain, nausea, vomiting, fever, bleeding, or drainage from the incision.   ____________________________ Celene Squibb. Theda Sers, [place] amc:ap D: 06/19/2011 11:11:19 ET T: 06/19/2011 11:24:39 ET JOB#: [id number]  cc: Celene Squibb. Theda Sers, [place], <Dictator> ANN M COLLINS PA ELECTRONICALLY SIGNED 06/20/2011 8:11

## 2014-09-01 ENCOUNTER — Other Ambulatory Visit: Payer: Self-pay | Admitting: *Deleted

## 2014-09-01 DIAGNOSIS — C8590 Non-Hodgkin lymphoma, unspecified, unspecified site: Secondary | ICD-10-CM

## 2014-09-03 ENCOUNTER — Ambulatory Visit
Admit: 2014-09-03 | Disposition: A | Discharge status: Home / Self Care | Payer: Self-pay | Attending: Oncology | Admitting: Oncology

## 2014-09-07 ENCOUNTER — Encounter (INDEPENDENT_AMBULATORY_CARE_PROVIDER_SITE_OTHER): Payer: Self-pay

## 2014-09-07 ENCOUNTER — Inpatient Hospital Stay (HOSPITAL_BASED_OUTPATIENT_CLINIC_OR_DEPARTMENT_OTHER): Payer: Medicare Other | Admitting: Oncology

## 2014-09-07 ENCOUNTER — Inpatient Hospital Stay: Payer: Medicare Other | Attending: Oncology

## 2014-09-07 ENCOUNTER — Other Ambulatory Visit: Payer: Self-pay | Admitting: *Deleted

## 2014-09-07 VITALS — BP 148/98 | HR 81 | Temp 96.1°F | Resp 18 | Wt 189.1 lb

## 2014-09-07 DIAGNOSIS — Z79899 Other long term (current) drug therapy: Secondary | ICD-10-CM | POA: Insufficient documentation

## 2014-09-07 DIAGNOSIS — C8590 Non-Hodgkin lymphoma, unspecified, unspecified site: Secondary | ICD-10-CM

## 2014-09-07 DIAGNOSIS — J45909 Unspecified asthma, uncomplicated: Secondary | ICD-10-CM

## 2014-09-07 DIAGNOSIS — K760 Fatty (change of) liver, not elsewhere classified: Secondary | ICD-10-CM

## 2014-09-07 DIAGNOSIS — Z85828 Personal history of other malignant neoplasm of skin: Secondary | ICD-10-CM

## 2014-09-07 DIAGNOSIS — C8299 Follicular lymphoma, unspecified, extranodal and solid organ sites: Secondary | ICD-10-CM

## 2014-09-07 DIAGNOSIS — M129 Arthropathy, unspecified: Secondary | ICD-10-CM | POA: Insufficient documentation

## 2014-09-07 DIAGNOSIS — Z8572 Personal history of non-Hodgkin lymphomas: Secondary | ICD-10-CM

## 2014-09-07 DIAGNOSIS — Z9221 Personal history of antineoplastic chemotherapy: Secondary | ICD-10-CM | POA: Diagnosis not present

## 2014-09-07 LAB — COMPREHENSIVE METABOLIC PANEL
ALBUMIN: 4.5 g/dL (ref 3.5–5.0)
ALT: 58 U/L — AB (ref 14–54)
ANION GAP: 5 (ref 5–15)
AST: 42 U/L — AB (ref 15–41)
Alkaline Phosphatase: 69 U/L (ref 38–126)
BUN: 24 mg/dL — ABNORMAL HIGH (ref 6–20)
CALCIUM: 9.4 mg/dL (ref 8.9–10.3)
CO2: 26 mmol/L (ref 22–32)
CREATININE: 0.88 mg/dL (ref 0.44–1.00)
Chloride: 106 mmol/L (ref 101–111)
GFR calc non Af Amer: >60 mL/min (ref >60)
GLUCOSE: 117 mg/dL — AB (ref 65–99)
Potassium: 3.9 mmol/L (ref 3.5–5.1)
Sodium: 137 mmol/L (ref 135–145)
Total Bilirubin: 0.4 mg/dL (ref 0.3–1.2)
Total Protein: 7.8 g/dL (ref 6.5–8.1)

## 2014-09-07 LAB — CBC WITH DIFFERENTIAL/PLATELET
Basophils Absolute: 0 10*3/uL (ref 0–0.1)
Basophils Relative: 1 %
Eosinophils Absolute: 0.1 10*3/uL (ref 0–0.7)
HEMATOCRIT: 44.5 % (ref 35.0–47.0)
HEMOGLOBIN: 15.1 g/dL (ref 12.0–16.0)
LYMPHS ABS: 2.1 10*3/uL (ref 1.0–3.6)
Lymphocytes Relative: 39 %
MCH: 30.2 pg (ref 26.0–34.0)
MCHC: 33.8 g/dL (ref 32.0–36.0)
MCV: 89.1 fL (ref 80.0–100.0)
Monocytes Absolute: 0.5 10*3/uL (ref 0.2–0.9)
Monocytes Relative: 10 %
Neutro Abs: 2.7 10*3/uL (ref 1.4–6.5)
PLATELETS: 282 10*3/uL (ref 150–440)
RBC: 5 MIL/uL (ref 3.80–5.20)
RDW: 13.5 % (ref 11.5–14.5)
WBC: 5.5 10*3/uL (ref 3.6–11.0)

## 2014-09-07 LAB — LACTATE DEHYDROGENASE: LDH: 170 U/L (ref 98–192)

## 2014-09-13 ENCOUNTER — Encounter: Payer: Self-pay | Admitting: Oncology

## 2014-09-13 DIAGNOSIS — C8299 Follicular lymphoma, unspecified, extranodal and solid organ sites: Secondary | ICD-10-CM | POA: Insufficient documentation

## 2014-09-13 HISTORY — DX: Follicular lymphoma, unspecified, extranodal and solid organ sites: C82.99

## 2014-09-13 NOTE — Progress Notes (Signed)
[place] @ [facility] Telephone:(336) [phone]  Fax:(336) [place]: 06-24-1940  MR#: [id number]  BSW#:[id number]  Patient Care Team: Marinda Elk, MD as PCP - General (Physician Assistant)  CHIEF COMPLAINT:  Chief Complaint  Patient presents with  . Follow-up  . Lymphoma    Oncology History   Chief Complaint/Diagnosis:   1. Mesenteric adenopathy concerning for lyphoma. Pet positive lymphadenopathy. 2. 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 3. Rituxan weekly x4 finished in March of 2013. 4. PET scan in July of 2013 complete remission. 5. Maintenance Rituxan started in July of 2013. 6.Abnormal mammogram in August of 2015 biopsy has been reported to be negative for malignanc     Lymphoma, non-Hodgkin's   06/02/2014 Initial Diagnosis Lymphoma, non-Hodgkin's    No flowsheet data found.  INTERVAL HISTORY:  74 year old lady came today further follow-up regarding low-grade lymphoma stage III disease.  Patient does not have any chills fever no abdominal pain no nausea no vomiting or diarrhea.  Had a PET scan done which has been independently reviewed there is no evidence of recurrent or progressive disease.  REVIEW OF SYSTEMS:   Review of Systems  Constitutional: Negative for fever, chills, weight loss, malaise/fatigue and diaphoresis.  HENT: Negative for congestion, ear discharge, ear pain, hearing loss, nosebleeds, sore throat and tinnitus.   Eyes: Negative for blurred vision, double vision, photophobia, pain, discharge and redness.  Respiratory: Negative for cough, hemoptysis, sputum production, shortness of breath, wheezing and stridor.   Cardiovascular: Negative for chest pain, palpitations, orthopnea, claudication, leg swelling and PND.  Gastrointestinal: Negative for heartburn, nausea, vomiting, abdominal pain, diarrhea, constipation, blood in stool and melena.  Genitourinary: Negative for dysuria, urgency, frequency, hematuria and flank  pain.  Musculoskeletal: Negative for myalgias, back pain, joint pain, falls and neck pain.  Skin: Negative for itching and rash.  Neurological: Negative for dizziness, tingling, tremors, sensory change, speech change, focal weakness, seizures, loss of consciousness, weakness and headaches.  Endo/Heme/Allergies: Negative for environmental allergies and polydipsia. Does not bruise/bleed easily.  Psychiatric/Behavioral: Negative for depression, suicidal ideas, hallucinations, memory loss and substance abuse. The patient is not nervous/anxious and does not have insomnia.   All other systems reviewed and are negative.   As per HPI. Otherwise, a complete review of systems is negatve.  PAST MEDICAL HISTORY: Past Medical History  Diagnosis Date  . Non Hodgkin's lymphoma   . Barrett esophagus   . Skin cancer   . Asthma   . Migraine   . Arthritis     degenerative of knees  . 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 Allergy     seasonal  . 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 Lymph node removal from left groin    . Cholecystectomy    . Facial cosmetic surgery    . Appendectomy    . Abdominal hysterectomy    . History of multiple colonic polyps      FAMILY HISTORY    No significant family history of malignancy.   ADVANCED DIRECTIVES:   Advance Directive ([place]) yes   Do you want to revise or change your advance directive? No    HEALTH MAINTENANCE: History  Substance Use Topics  . Smoking status: Never Smoker   . Smokeless tobacco: Not on file  . Alcohol Use: Yes  Comment: rare social occasions only     Allergies  Allergen Reactions  . No Known Allergies     Current Outpatient Prescriptions  Medication Sig Dispense Refill  . esomeprazole (NEXIUM) 20 MG capsule Take 20 mg by mouth daily at 12 noon.    . loratadine (CLARITIN) 10  MG tablet Take 10 mg by mouth daily.     No current facility-administered medications for this visit.    OBJECTIVE: Filed Vitals:   09/07/14 1029  BP: 148/98  Pulse: 81  Temp: 96.1 F (35.6 C)  Resp: 18     There is no height on file to calculate BMI.    ECOG FS:0 - Asymptomatic  Physical Exam  Constitutional: She is oriented to person, place, and time and well-developed, well-nourished, and in no distress. No distress.  HENT:  Head: Normocephalic and atraumatic.  Right Ear: External ear normal.  Left Ear: External ear normal.  Nose: Nose normal.  Mouth/Throat: Oropharynx is clear and moist.  Eyes: Conjunctivae and EOM are normal.  Neck: Normal range of motion. No JVD present. No tracheal deviation present. No thyromegaly present.  Cardiovascular: Normal rate and regular rhythm.  Exam reveals no friction rub.   No murmur heard. Pulmonary/Chest: No stridor. No respiratory distress. She has no wheezes. She has no rales. She exhibits no tenderness.  Abdominal: She exhibits no distension and no mass. There is no tenderness. There is no rebound.  Genitourinary: Left adnexa normal.  Musculoskeletal: She exhibits no edema or tenderness.  Neurological: She is alert and oriented to person, place, and time. She has normal reflexes. Gait normal. GCS score is 15.  Skin: Skin is warm and dry. No rash noted. She is not diaphoretic.  Psychiatric: Mood, memory and affect normal.  Nursing note and vitals reviewed.    LAB RESULTS:     Component Value Date/Time   NA 137 09/07/2014 1006   NA 141 03/03/2014 1022   K 3.9 09/07/2014 1006   K 4.1 03/03/2014 1022   CL 106 09/07/2014 1006   CL 105 03/03/2014 1022   CO2 26 09/07/2014 1006   CO2 28 03/03/2014 1022   GLUCOSE 117* 09/07/2014 1006   GLUCOSE 112* 03/03/2014 1022   BUN 24* 09/07/2014 1006   BUN 18 03/03/2014 1022   CREATININE 0.88 09/07/2014 1006   CREATININE 0.94 03/03/2014 1022   CALCIUM 9.4 09/07/2014 1006   CALCIUM 9.4  03/03/2014 1022   PROT 7.8 09/07/2014 1006   PROT 7.4 03/03/2014 1022   ALBUMIN 4.5 09/07/2014 1006   ALBUMIN 4.1 03/03/2014 1022   AST 42* 09/07/2014 1006   AST 29 03/03/2014 1022   ALT 58* 09/07/2014 1006   ALT 53 03/03/2014 1022   ALKPHOS 69 09/07/2014 1006   ALKPHOS 81 03/03/2014 1022   BILITOT 0.4 09/07/2014 1006   GFRNONAA >60 09/07/2014 1006   GFRNONAA >60 09/02/2013 0939   GFRAA >60 09/07/2014 1006   GFRAA >60 09/02/2013 0939      Lab Results  Component Value Date   WBC 5.5 09/07/2014   NEUTROABS 2.7 09/07/2014   HGB 15.1 09/07/2014   HCT 44.5 09/07/2014   MCV 89.1 09/07/2014   PLT 282 09/07/2014      Chemistry      Component Value Date/Time   NA 137 09/07/2014 1006   NA 141 03/03/2014 1022   K 3.9 09/07/2014 1006   K 4.1 03/03/2014 1022   CL 106 09/07/2014 1006   CL 105 03/03/2014 1022  CO2 26 09/07/2014 1006   CO2 28 03/03/2014 1022   BUN 24* 09/07/2014 1006   BUN 18 03/03/2014 1022   CREATININE 0.88 09/07/2014 1006   CREATININE 0.94 03/03/2014 1022      Component Value Date/Time   CALCIUM 9.4 09/07/2014 1006   CALCIUM 9.4 03/03/2014 1022   ALKPHOS 69 09/07/2014 1006   ALKPHOS 81 03/03/2014 1022   AST 42* 09/07/2014 1006   AST 29 03/03/2014 1022   ALT 58* 09/07/2014 1006   ALT 53 03/03/2014 1022   BILITOT 0.4 09/07/2014 1006        STUDIES: Nm Pet Image Restag (ps) Skull Base To Thigh  09/03/2014   CLINICAL DATA:  Subsequent treatment strategy for follicular lymphoma.  EXAM: NUCLEAR MEDICINE PET SKULL BASE TO THIGH  TECHNIQUE: 13.18 mCi F-18 FDG was injected intravenously. Full-ring PET imaging was performed from the skull base to thigh after the radiotracer. CT data was obtained and used for attenuation correction and anatomic localization.  FASTING BLOOD GLUCOSE:  Value: 139 mg/dl  COMPARISON:  08/28/2013  FINDINGS: NECK  No hypermetabolic lymph nodes in the neck.  CHEST  No suspicious pulmonary nodules.  No hypermetabolic thoracic  lymphadenopathy.  ABDOMEN/PELVIS  No abnormal hypermetabolic activity within the liver, pancreas, adrenal glands, or spleen.  No hypermetabolic lymph nodes in the abdomen or pelvis. Nonspecific lymphatic stranding in the jejunal mesenteric, unchanged.  Hepatic steatosis. Status post cholecystectomy. Moderate fat containing supraumbilical hernia. Status post hysterectomy.  SKELETON  No focal hypermetabolic activity to suggest skeletal metastasis.  IMPRESSION: No evidence of active lymphomatous involvement in the neck, chest, or abdomen/pelvis.   Electronically Signed   By: Julian Hy M.D.   On: 09/03/2014 10:08    ASSESSMENT: 1. Follicular Lymphoma.  status post maintenance rituximab therapy PET scan has been reviewed independently and there is no evidence of recurrent disease On clinical ground there is no evidence ofurrent or progressive diseas  PLAN:  Continue follow-up.  In 6 months with lab.  Or before patient was advised to call us if there is any significant change  Patient expressed understanding and was in agreement with this plan. She also understands that She can call clinic at any time with any questions, concerns, or complaints.      Forest Gleason, MD   09/13/2014 4:40 PM

## 2014-09-24 ENCOUNTER — Encounter
Admission: RE | Admit: 2014-09-24 | Discharge: 2014-09-24 | Disposition: A | Discharge status: Home / Self Care | Payer: Medicare Other | Source: Ambulatory Visit | Attending: Surgery | Admitting: Surgery

## 2014-09-24 DIAGNOSIS — Z01810 Encounter for preprocedural cardiovascular examination: Secondary | ICD-10-CM | POA: Diagnosis present

## 2014-09-24 DIAGNOSIS — K439 Ventral hernia without obstruction or gangrene: Secondary | ICD-10-CM | POA: Diagnosis not present

## 2014-09-24 DIAGNOSIS — Z01818 Encounter for other preprocedural examination: Secondary | ICD-10-CM

## 2014-09-24 NOTE — Patient Instructions (Signed)
  Your procedure is scheduled on: @ADMITDT2 @ 10/01/2014 Report to Day Surgery. To find out your arrival time please call [phone] between 1PM - 3PM on 10/01/2014.  Remember: Instructions that are not followed completely may result in serious medical risk, up to and including death, or upon the discretion of your surgeon and anesthesiologist your surgery may need to be rescheduled.    ___x_ 1. Do not eat food or drink liquids after midnight. No gum chewing or hard candies.     ___x_ 2. No Alcohol for 24 hours before or after surgery.   ____ 3. Bring all medications with you on the day of surgery if instructed.    __x__ 4. Notify your doctor if there is any change in your medical condition     (cold, fever, infections).     Do not wear jewelry, make-up, hairpins, clips or nail polish.  Do not wear lotions, powders, or perfumes. You may wear deodorant.  Do not shave 48 hours prior to surgery. Men may shave face and neck.  Do not bring valuables to the hospital.    [facility] is not responsible for any belongings or valuables.               Contacts, dentures or bridgework may not be worn into surgery.  Leave your suitcase in the car. After surgery it may be brought to your room.  For patients admitted to the hospital, discharge time is determined by your                treatment team.   Patients discharged the day of surgery will not be allowed to drive home.   Please read over the following fact sheets that you were given:   Surgical Site Infection Prevention   ____ Take these medicines the morning of surgery with A SIP OF WATER:    1. loradidine  2. omeprazole  3.   4.  5.  6.  ____ Fleet Enema (as directed)   __x__ Use CHG Soap as directed  ___x_ Use inhalers on the day of surgery  ____ Stop metformin 2 days prior to surgery    ____ Take 1/2 of usual insulin dose the night before surgery and none on the morning of surgery.   ____ Stop Coumadin/Plavix/aspirin on    ____ Stop Anti-inflammatories on    ____ Stop supplements until after surgery.    ____ Bring C-Pap to the hospital.

## 2014-10-02 ENCOUNTER — Encounter
Admission: RE | Disposition: A | Discharge status: Home / Self Care | Payer: Self-pay | Source: Ambulatory Visit | Attending: Surgery

## 2014-10-02 ENCOUNTER — Encounter: Payer: Self-pay | Admitting: *Deleted

## 2014-10-02 ENCOUNTER — Ambulatory Visit: Payer: Medicare Other | Admitting: *Deleted

## 2014-10-02 ENCOUNTER — Ambulatory Visit
Admission: RE | Admit: 2014-10-02 | Discharge: 2014-10-02 | Disposition: A | Discharge status: Home / Self Care | Payer: Medicare Other | Source: Ambulatory Visit | Attending: Surgery | Admitting: Surgery

## 2014-10-02 DIAGNOSIS — K2270 Barrett's esophagus without dysplasia: Secondary | ICD-10-CM | POA: Insufficient documentation

## 2014-10-02 DIAGNOSIS — K219 Gastro-esophageal reflux disease without esophagitis: Secondary | ICD-10-CM | POA: Insufficient documentation

## 2014-10-02 DIAGNOSIS — K439 Ventral hernia without obstruction or gangrene: Secondary | ICD-10-CM | POA: Diagnosis present

## 2014-10-02 DIAGNOSIS — M1990 Unspecified osteoarthritis, unspecified site: Secondary | ICD-10-CM | POA: Insufficient documentation

## 2014-10-02 DIAGNOSIS — Z79899 Other long term (current) drug therapy: Secondary | ICD-10-CM | POA: Diagnosis not present

## 2014-10-02 DIAGNOSIS — J45909 Unspecified asthma, uncomplicated: Secondary | ICD-10-CM | POA: Diagnosis not present

## 2014-10-02 DIAGNOSIS — C8590 Non-Hodgkin lymphoma, unspecified, unspecified site: Secondary | ICD-10-CM | POA: Insufficient documentation

## 2014-10-02 HISTORY — PX: VENTRAL HERNIA REPAIR: SHX424

## 2014-10-02 SURGERY — REPAIR, HERNIA, VENTRAL
Anesthesia: General | Site: Abdomen | Wound class: Clean

## 2014-10-02 MED ORDER — HYDROCODONE-ACETAMINOPHEN 5-325 MG PO TABS: 1.0000 | ORAL_TABLET | ORAL | Status: DC | PRN

## 2014-10-02 MED ORDER — CEFAZOLIN SODIUM-DEXTROSE 2-3 GM-% IV SOLR
INTRAVENOUS | Status: AC
  Administered 2014-10-02: 2 g via INTRAVENOUS
  Filled 2014-10-02: qty 50

## 2014-10-02 MED ORDER — CEFAZOLIN SODIUM-DEXTROSE 2-3 GM-% IV SOLR
2.0000 g | Freq: Once | INTRAVENOUS | Status: AC
  Administered 2014-10-02: 2 g via INTRAVENOUS

## 2014-10-02 MED ORDER — NEOSTIGMINE METHYLSULFATE 10 MG/10ML IV SOLN
INTRAVENOUS | Status: DC | PRN
  Administered 2014-10-02: 3 mg via INTRAVENOUS

## 2014-10-02 MED ORDER — ONDANSETRON HCL 4 MG/2ML IJ SOLN: 4.0000 mg | Freq: Once | INTRAMUSCULAR | Status: DC | PRN

## 2014-10-02 MED ORDER — SUCCINYLCHOLINE CHLORIDE 20 MG/ML IJ SOLN
INTRAMUSCULAR | Status: DC | PRN
  Administered 2014-10-02: 100 mg via INTRAVENOUS

## 2014-10-02 MED ORDER — PROPOFOL 10 MG/ML IV BOLUS
INTRAVENOUS | Status: DC | PRN
  Administered 2014-10-02: 150 mg via INTRAVENOUS
  Administered 2014-10-02: 30 mg via INTRAVENOUS

## 2014-10-02 MED ORDER — EPHEDRINE SULFATE 50 MG/ML IJ SOLN
INTRAMUSCULAR | Status: DC | PRN
  Administered 2014-10-02 (×3): 5 mg via INTRAVENOUS

## 2014-10-02 MED ORDER — HYDROMORPHONE HCL 1 MG/ML IJ SOLN: 0.2500 mg | INTRAMUSCULAR | Status: DC | PRN

## 2014-10-02 MED ORDER — PROPOFOL INFUSION 10 MG/ML OPTIME
INTRAVENOUS | Status: DC | PRN
  Administered 2014-10-02: 30 ug/kg/min via INTRAVENOUS

## 2014-10-02 MED ORDER — BUPIVACAINE-EPINEPHRINE 0.5% -1:200000 IJ SOLN
INTRAMUSCULAR | Status: DC | PRN
  Administered 2014-10-02: 15 mL

## 2014-10-02 MED ORDER — ROCURONIUM BROMIDE 100 MG/10ML IV SOLN
INTRAVENOUS | Status: DC | PRN
  Administered 2014-10-02 (×2): 10 mg via INTRAVENOUS
  Administered 2014-10-02: 30 mg via INTRAVENOUS

## 2014-10-02 MED ORDER — ONDANSETRON HCL 4 MG/2ML IJ SOLN
INTRAMUSCULAR | Status: DC | PRN
  Administered 2014-10-02: 4 mg via INTRAVENOUS

## 2014-10-02 MED ORDER — MIDAZOLAM HCL 2 MG/2ML IJ SOLN
INTRAMUSCULAR | Status: DC | PRN
  Administered 2014-10-02: 2 mg via INTRAVENOUS

## 2014-10-02 MED ORDER — BUPIVACAINE-EPINEPHRINE (PF) 0.5% -1:200000 IJ SOLN
INTRAMUSCULAR | Status: AC
  Filled 2014-10-02: qty 30

## 2014-10-02 MED ORDER — LACTATED RINGERS IV SOLN
INTRAVENOUS | Status: DC
  Administered 2014-10-02 (×2): via INTRAVENOUS

## 2014-10-02 MED ORDER — CEFAZOLIN SODIUM-DEXTROSE 2-3 GM-% IV SOLR
INTRAVENOUS | Status: DC | PRN
  Administered 2014-10-02: 2 g via INTRAVENOUS

## 2014-10-02 MED ORDER — GLYCOPYRROLATE 0.2 MG/ML IJ SOLN
INTRAMUSCULAR | Status: DC | PRN
  Administered 2014-10-02: .4 mg via INTRAVENOUS

## 2014-10-02 MED ORDER — LIDOCAINE HCL (CARDIAC) 20 MG/ML IV SOLN
INTRAVENOUS | Status: DC | PRN
  Administered 2014-10-02: 100 mg via INTRAVENOUS

## 2014-10-02 MED ORDER — FENTANYL CITRATE (PF) 100 MCG/2ML IJ SOLN
INTRAMUSCULAR | Status: DC | PRN
  Administered 2014-10-02 (×2): 50 ug via INTRAVENOUS

## 2014-10-02 SURGICAL SUPPLY — 30 items
CANISTER SUCT 1200ML W/VALVE (MISCELLANEOUS) ×3 IMPLANT
CHLORAPREP W/TINT 26ML (MISCELLANEOUS) ×3 IMPLANT
DECANTER SPIKE VIAL GLASS SM (MISCELLANEOUS) ×3 IMPLANT
DRAPE LAPAROTOMY 100X77 ABD (DRAPES) ×3 IMPLANT
GAUZE SPONGE 4X4 12PLY STRL (GAUZE/BANDAGES/DRESSINGS) IMPLANT
GLOVE BIO SURGEON STRL SZ7 (GLOVE) ×6 IMPLANT
GLOVE BIO SURGEON STRL SZ7.5 (GLOVE) ×3 IMPLANT
GLOVE BIOGEL PI IND STRL 7.0 (GLOVE) ×2 IMPLANT
GLOVE BIOGEL PI INDICATOR 7.0 (GLOVE) ×4
GOWN STRL REUS W/ TWL LRG LVL3 (GOWN DISPOSABLE) ×3 IMPLANT
GOWN STRL REUS W/TWL LRG LVL3 (GOWN DISPOSABLE) ×6
KIT RM TURNOVER STRD PROC AR (KITS) ×3 IMPLANT
LABEL OR SOLS (LABEL) ×3 IMPLANT
LIQUID BAND (GAUZE/BANDAGES/DRESSINGS) ×3 IMPLANT
MESH SYNTHETIC 4X6 SOFT BARD (Mesh General) ×1 IMPLANT
MESH SYNTHETIC SOFT BARD 4X6 (Mesh General) ×2 IMPLANT
NDL SAFETY 25GX1.5 (NEEDLE) ×3 IMPLANT
NS IRRIG 500ML POUR BTL (IV SOLUTION) ×3 IMPLANT
PACK BASIN MINOR ARMC (MISCELLANEOUS) ×3 IMPLANT
PAD GROUND ADULT SPLIT (MISCELLANEOUS) ×3 IMPLANT
SET INTRO CAPELLA COAXIAL (SET/KITS/TRAYS/PACK) IMPLANT
STAPLER SKIN PROX 35W (STAPLE) IMPLANT
SURGILON ×9 IMPLANT
SUT CHROMIC 4 0 RB 1X27 (SUTURE) ×3 IMPLANT
SUT MNCRL 4-0 (SUTURE) ×2
SUT MNCRL 4-0 27XMFL (SUTURE) ×1
SUT VIC AB 4-0 SH 27 (SUTURE) ×2
SUT VIC AB 4-0 SH 27XANBCTRL (SUTURE) ×1 IMPLANT
SUTURE MNCRL 4-0 27XMF (SUTURE) ×1 IMPLANT
SYRINGE 10CC LL (SYRINGE) ×3 IMPLANT

## 2014-10-02 NOTE — Transfer of Care (Signed)
Immediate Anesthesia Transfer of Care Note  Patient: [name]  Procedure(s) Performed: Procedure(s) with comments: HERNIA REPAIR VENTRAL ADULT (N/A) - with mesh  Patient Location: PACU  Anesthesia Type:General  Level of Consciousness: sedated and patient cooperative  Airway & Oxygen Therapy: Patient Spontanous Breathing and Patient connected to face mask oxygen  Post-op Assessment: Report given to RN and Post -op Vital signs reviewed and stable  Post vital signs: Reviewed and stable  Last Vitals:  Filed Vitals:   10/02/14 1130  BP: 148/89  Pulse: 109  Temp: 36 C  Resp: 19    Complications: No apparent anesthesia complications

## 2014-10-02 NOTE — Op Note (Signed)
OPERATIVE REPORT  PREOPERATIVE  DIAGNOSIS: . Ventral hernia  POSTOPERATIVE DIAGNOSIS: . Ventral hernia  PROCEDURE: . Ventral hernia repair  ANESTHESIA:  General  SURGEON: Rochel Brome  MD   INDICATIONS: . This 74 year old female has a one-year history of bulging in the epigastrium which has gradually increased in size having moderate pain. She has a past history of upper abdominal midline incision done for excision of retroperitoneal lymph node and cholecystectomy. A ventral hernia was demonstrated on physical exam with a dimension 10 to 12 cm. Surgery was recommended for definitive treatment.  With the patient on the operating table in the supine position. She was placed under general endotracheal anesthesia. The abdomen was prepared with core prep and draped in a sterile manner  An upper abdominal midline incision was made some 7 cm in length and carried down through subcutaneous tissues dissecting through several centimeters of subcutaneous fat to encounter a ventral hernia sac was dissected free from surrounding structures. The sac was some 3 inches in length. The sac was dissected free from the fascial ring defect and was inverted. The properitoneal fat was dissected away from the fascia circumferentially. The defect was some 2.8 cm in dimension. There was another hernia defect found just cephalad to this defect with a 1.2 cm bridge of fascia between the 2 The more cephalad defect was approximately 8 mm in dimension. Bard soft mesh was cut out to create a rectangular mesh which was 3 x 5 cm and was placed into the properitoneal plane oriented longitudinally. It was sutured to the deep fascia above the smaller defect with through and through 0 Surgilon sutures. Was also sutured below the larger defect with through and through Surgilon sutures. The repair of the defect was carried out with a row of 0 Surgilon figure-of-eight sutures incorporating each suture into the mesh. Next the larger defect  was closed with a transversely oriented suture line of interrupted 0 Surgilon figure-of-eight sutures incorporating each suture into the mesh. Hemostasis was intact. The deep fascia and subcutaneous tissues were infiltrated with half percent Sensorcaine with epinephrine. Subcutaneous tissues were approximated with 4-0 Vicryl the skin was closed with running 4-0 Monocryl subcuticular suture and LiquiBand.  The patient tolerated the procedure satisfactorily and was then prepared for transfer to the recovery room  [facility].D.

## 2014-10-02 NOTE — Anesthesia Preprocedure Evaluation (Signed)
Anesthesia Evaluation  Patient identified by MRN, date of birth, ID band Patient awake    Reviewed: Allergy & Precautions, NPO status , Patient's Chart, lab work & pertinent test results  History of Anesthesia Complications (+) PONV  Airway Mallampati: III  TM Distance: >3 FB Neck ROM: Limited  Mouth opening: Limited Mouth Opening  Dental  (+) Teeth Intact   Pulmonary asthma ,    Pulmonary exam normal       Cardiovascular negative cardio ROS Normal cardiovascular exam    Neuro/Psych    GI/Hepatic GERD-  Medicated and Controlled,  Endo/Other    Renal/GU      Musculoskeletal  (+) Arthritis -, Osteoarthritis,    Abdominal (+) + obese,  Abdomen: soft.    Peds  Hematology NHL in remission for 2 years now.   Anesthesia Other Findings Large ventral hernia.  Reproductive/Obstetrics                             Anesthesia Physical Anesthesia Plan  ASA: III  Anesthesia Plan: General   Post-op Pain Management:    Induction: Intravenous  Airway Management Planned: Oral ETT and Video Laryngoscope Planned  Additional Equipment:   Intra-op Plan:   Post-operative Plan: Extubation in OR  Informed Consent: I have reviewed the patients History and Physical, chart, labs and discussed the procedure including the risks, benefits and alternatives for the proposed anesthesia with the patient or authorized representative who has indicated his/her understanding and acceptance.     Plan Discussed with: CRNA  Anesthesia Plan Comments:         Anesthesia Quick Evaluation

## 2014-10-02 NOTE — H&P (Signed)
  She reports no change in overall condition since the day of the office visit.  The hernia was palpated and demonstrated its location  I have discussed the plan for ventral hernia repair  Rochel Brome M.D. 10/02/2014

## 2014-10-02 NOTE — Anesthesia Postprocedure Evaluation (Signed)
  Anesthesia Post-op Note  Patient: [name]  Procedure(s) Performed: Procedure(s) with comments: HERNIA REPAIR VENTRAL ADULT (N/A) - with mesh  Anesthesia type:General  Patient location: PACU  Post pain: Pain level controlled  Post assessment: Post-op Vital signs reviewed, Patient's Cardiovascular Status Stable, Respiratory Function Stable, Patent Airway and No signs of Nausea or vomiting  Post vital signs: Reviewed and stable  Last Vitals:  Filed Vitals:   10/02/14 1143  BP: 131/77  Pulse: 98  Temp:   Resp: 22    Level of consciousness: awake, alert  and patient cooperative  Complications: No apparent anesthesia complications

## 2014-10-02 NOTE — Discharge Instructions (Addendum)
Take Tylenol or Norco if needed for pain.  May shower  Avoid straining and heavy lifting   AMBULATORY SURGERY  DISCHARGE INSTRUCTIONS   1) The drugs that you were given will stay in your system until tomorrow so for the next 24 hours you should not:  A) Drive an automobile B) Make any legal decisions C) Drink any alcoholic beverage   2) You may resume regular meals tomorrow.  Today it is better to start with liquids and gradually work up to solid foods.  You may eat anything you prefer, but it is better to start with liquids, then soup and crackers, and gradually work up to solid foods.   3) Please notify your doctor immediately if you have any unusual bleeding, trouble breathing, redness and pain at the surgery site, drainage, fever, or pain not relieved by medication.             [facility] Number [phone]

## 2014-12-29 ENCOUNTER — Telehealth: Payer: Self-pay | Admitting: *Deleted

## 2014-12-29 ENCOUNTER — Other Ambulatory Visit: Payer: Self-pay | Admitting: Family Medicine

## 2014-12-29 DIAGNOSIS — Z1231 Encounter for screening mammogram for malignant neoplasm of breast: Secondary | ICD-10-CM | POA: Insufficient documentation

## 2014-12-29 DIAGNOSIS — R928 Other abnormal and inconclusive findings on diagnostic imaging of breast: Secondary | ICD-10-CM

## 2014-12-29 DIAGNOSIS — N6320 Unspecified lump in the left breast, unspecified quadrant: Secondary | ICD-10-CM

## 2014-12-29 DIAGNOSIS — C8590 Non-Hodgkin lymphoma, unspecified, unspecified site: Secondary | ICD-10-CM

## 2014-12-29 NOTE — Telephone Encounter (Signed)
States she was to have a mammogram done of left breast. Looking at the report from August 2015, she was to have a 6 month left breast mammo for fu. AS it is now 1 year out, per L Herring, AGNP-C, we will order a bil screening mammo

## 2015-01-13 ENCOUNTER — Other Ambulatory Visit: Payer: Self-pay | Admitting: Family Medicine

## 2015-01-13 ENCOUNTER — Ambulatory Visit
Admission: RE | Admit: 2015-01-13 | Discharge: 2015-01-13 | Disposition: A | Discharge status: Home / Self Care | Payer: Medicare Other | Source: Ambulatory Visit | Attending: Family Medicine | Admitting: Family Medicine

## 2015-01-13 DIAGNOSIS — R928 Other abnormal and inconclusive findings on diagnostic imaging of breast: Secondary | ICD-10-CM

## 2015-01-13 DIAGNOSIS — Z1231 Encounter for screening mammogram for malignant neoplasm of breast: Secondary | ICD-10-CM

## 2015-03-10 ENCOUNTER — Encounter: Payer: Self-pay | Admitting: Oncology

## 2015-03-10 ENCOUNTER — Inpatient Hospital Stay: Payer: Medicare Other | Attending: Oncology | Admitting: Oncology

## 2015-03-10 ENCOUNTER — Inpatient Hospital Stay: Payer: Medicare Other

## 2015-03-10 VITALS — BP 136/73 | HR 79 | Temp 97.8°F | Resp 18 | Ht 62.0 in | Wt 186.1 lb

## 2015-03-10 DIAGNOSIS — B009 Herpesviral infection, unspecified: Secondary | ICD-10-CM | POA: Diagnosis not present

## 2015-03-10 DIAGNOSIS — Z85828 Personal history of other malignant neoplasm of skin: Secondary | ICD-10-CM | POA: Diagnosis not present

## 2015-03-10 DIAGNOSIS — Z8669 Personal history of other diseases of the nervous system and sense organs: Secondary | ICD-10-CM | POA: Insufficient documentation

## 2015-03-10 DIAGNOSIS — Z79899 Other long term (current) drug therapy: Secondary | ICD-10-CM | POA: Insufficient documentation

## 2015-03-10 DIAGNOSIS — J45909 Unspecified asthma, uncomplicated: Secondary | ICD-10-CM | POA: Diagnosis not present

## 2015-03-10 DIAGNOSIS — M1289 Other specific arthropathies, not elsewhere classified, multiple sites: Secondary | ICD-10-CM

## 2015-03-10 DIAGNOSIS — C8290 Follicular lymphoma, unspecified, unspecified site: Secondary | ICD-10-CM | POA: Insufficient documentation

## 2015-03-10 DIAGNOSIS — C8299 Follicular lymphoma, unspecified, extranodal and solid organ sites: Secondary | ICD-10-CM

## 2015-03-10 DIAGNOSIS — K2270 Barrett's esophagus without dysplasia: Secondary | ICD-10-CM | POA: Diagnosis not present

## 2015-03-10 DIAGNOSIS — Z9221 Personal history of antineoplastic chemotherapy: Secondary | ICD-10-CM | POA: Insufficient documentation

## 2015-03-10 DIAGNOSIS — R7989 Other specified abnormal findings of blood chemistry: Secondary | ICD-10-CM

## 2015-03-10 DIAGNOSIS — C8589 Other specified types of non-Hodgkin lymphoma, extranodal and solid organ sites: Secondary | ICD-10-CM

## 2015-03-10 DIAGNOSIS — C8590 Non-Hodgkin lymphoma, unspecified, unspecified site: Secondary | ICD-10-CM

## 2015-03-10 LAB — COMPREHENSIVE METABOLIC PANEL
ALT: 101 U/L — ABNORMAL HIGH (ref 14–54)
ANION GAP: 6 (ref 5–15)
AST: 78 U/L — ABNORMAL HIGH (ref 15–41)
Albumin: 4.3 g/dL (ref 3.5–5.0)
Alkaline Phosphatase: 67 U/L (ref 38–126)
BUN: 19 mg/dL (ref 6–20)
CHLORIDE: 104 mmol/L (ref 101–111)
CO2: 26 mmol/L (ref 22–32)
Calcium: 8.8 mg/dL — ABNORMAL LOW (ref 8.9–10.3)
Creatinine, Ser: 0.81 mg/dL (ref 0.44–1.00)
GFR calc non Af Amer: >60 mL/min (ref >60)
Glucose, Bld: 116 mg/dL — ABNORMAL HIGH (ref 65–99)
POTASSIUM: 4.3 mmol/L (ref 3.5–5.1)
SODIUM: 136 mmol/L (ref 135–145)
Total Bilirubin: 0.5 mg/dL (ref 0.3–1.2)
Total Protein: 7.2 g/dL (ref 6.5–8.1)

## 2015-03-10 LAB — CBC WITH DIFFERENTIAL/PLATELET
Basophils Absolute: 0.2 10*3/uL — ABNORMAL HIGH (ref 0–0.1)
Basophils Relative: 4 %
EOS ABS: 0.1 10*3/uL (ref 0–0.7)
EOS PCT: 2 %
HCT: 43.9 % (ref 35.0–47.0)
Hemoglobin: 14.8 g/dL (ref 12.0–16.0)
LYMPHS ABS: 2.2 10*3/uL (ref 1.0–3.6)
Lymphocytes Relative: 35 %
MCH: 30.4 pg (ref 26.0–34.0)
MCHC: 33.6 g/dL (ref 32.0–36.0)
MCV: 90.3 fL (ref 80.0–100.0)
Monocytes Absolute: 0.5 10*3/uL (ref 0.2–0.9)
Monocytes Relative: 8 %
Neutro Abs: 3.3 10*3/uL (ref 1.4–6.5)
Neutrophils Relative %: 51 %
PLATELETS: 268 10*3/uL (ref 150–440)
RBC: 4.87 MIL/uL (ref 3.80–5.20)
RDW: 13.3 % (ref 11.5–14.5)
WBC: 6.3 10*3/uL (ref 3.6–11.0)

## 2015-03-10 LAB — LACTATE DEHYDROGENASE: LDH: 162 U/L (ref 98–192)

## 2015-03-10 NOTE — Progress Notes (Signed)
[place] @ [facility] [phone]  Fax:([phone]) [place]: [birth year]  MR#: [id number]  XBW#:[id number]  Patient Care Team: Marinda Elk, MD as PCP - General (Physician Assistant)  CHIEF COMPLAINT:  Chief Complaint  Patient presents with  . OTHER    Oncology History   Chief Complaint/Diagnosis:   1. Mesenteric adenopathy concerning for lyphoma. Pet positive lymphadenopathy. 2. 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 3. Rituxan weekly x4 finished in March of 2013. 4. PET scan in July of 2013 complete remission. 5. Maintenance Rituxan started in July of 2013. 6.Abnormal mammogram in August of 2015 biopsy has been reported to be negative for malignanc     Lymphoma, non-Hodgkin's ([place])   06/02/2014 Initial Diagnosis Lymphoma, non-Hodgkin's    Oncology Flowsheet 10/02/2014  ondansetron (ZOFRAN) IV -    INTERVAL HISTORY:  74 year old lady came today further follow-up regarding low-grade lymphoma stage III disease.  Patient does not have any chills fever no abdominal pain no nausea no vomiting or diarrhea. \ No chills.  No fever.  Appetite has been stable. REVIEW OF SYSTEMS:   Review of Systems  Constitutional: Negative for fever, chills, weight loss, malaise/fatigue and diaphoresis.  HENT: Negative for congestion, ear discharge, ear pain, hearing loss, nosebleeds, sore throat and tinnitus.   Eyes: Negative for blurred vision, double vision, photophobia, pain, discharge and redness.  Respiratory: Negative for cough, hemoptysis, sputum production, shortness of breath, wheezing and stridor.   Cardiovascular: Negative for chest pain, palpitations, orthopnea, claudication, leg swelling and PND.  Gastrointestinal: Negative for heartburn, nausea, vomiting, abdominal pain, diarrhea, constipation, blood in stool and melena.  Genitourinary: Negative for dysuria, urgency, frequency, hematuria and flank pain.  Musculoskeletal: Negative for  myalgias, back pain, joint pain, falls and neck pain.  Skin: Negative for itching and rash.  Neurological: Negative for dizziness, tingling, tremors, sensory change, speech change, focal weakness, seizures, loss of consciousness, weakness and headaches.  Endo/Heme/Allergies: Negative for environmental allergies and polydipsia. Does not bruise/bleed easily.  Psychiatric/Behavioral: Negative for depression, suicidal ideas, hallucinations, memory loss and substance abuse. The patient is not nervous/anxious and does not have insomnia.   All other systems reviewed and are negative.   As per HPI. Otherwise, a complete review of systems is negatve.  PAST MEDICAL HISTORY: Past Medical History  Diagnosis Date  . Non Hodgkin's lymphoma ([place])   . Barrett esophagus   . Skin cancer   . Asthma   . Migraine   . 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 Allergy     seasonal  . 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 Arthritis     degenerative of hip right    PAST SURGICAL HISTORY: Past Surgical History  Procedure Laterality Date  . Lymph node removal from left groin    . Cholecystectomy    . Facial cosmetic surgery    . Appendectomy    . Abdominal hysterectomy    . History of multiple colonic polyps    . Basal cell carcinoma excision      on both sides of nose  . Ventral hernia repair N/A 10/02/2014    Procedure: HERNIA REPAIR VENTRAL ADULT;  Surgeon: Leonie Green, MD;  Location: [facility] ORS;  Service: General;  Laterality: N/A;  with mesh  . Breast biopsy Left 12/2013    hyperplasia    FAMILY HISTORY    No significant family history of  malignancy.   ADVANCED DIRECTIVES:   Advance Directive ([place]) yes   Do you want to revise or change your advance directive? No    HEALTH MAINTENANCE: Social History  Substance Use Topics  . Smoking status: Never Smoker   . Smokeless tobacco: Never Used  . Alcohol Use: 0.6  oz/week    1 Cans of beer per week     Comment: rare social occasions only     Allergies  Allergen Reactions  . No Known Allergies   . Buprenorphine Hcl Nausea And Vomiting  . Morphine And Related Nausea And Vomiting    Current Outpatient Prescriptions  Medication Sig Dispense Refill  . albuterol (PROVENTIL HFA;VENTOLIN HFA) 108 (90 BASE) MCG/ACT inhaler Inhale 2 puffs into the lungs every 6 (six) hours as needed for wheezing or shortness of breath.    . loperamide (IMODIUM A-D) 2 MG tablet Take 2 mg by mouth 4 (four) times daily as needed for diarrhea or loose stools.    Marland Kitchen loratadine (CLARITIN) 10 MG tablet Take 10 mg by mouth every morning.     . Omeprazole (RA OMEPRAZOLE) 20 MG TBEC Take 20 mg by mouth.     No current facility-administered medications for this visit.    OBJECTIVE: Filed Vitals:   03/10/15 1150  BP: 136/73  Pulse: 79  Temp: 97.8 F (36.6 C)  Resp: 18     Body mass index is 34.03 kg/(m^2).    ECOG FS:0 - Asymptomatic  Physical Exam  Constitutional: She is oriented to person, place, and time and well-developed, well-nourished, and in no distress. No distress.  HENT:  Head: Normocephalic and atraumatic.  Right Ear: External ear normal.  Left Ear: External ear normal.  Nose: Nose normal.  Mouth/Throat: Oropharynx is clear and moist.  Eyes: Conjunctivae and EOM are normal.  Neck: Normal range of motion. No JVD present. No tracheal deviation present. No thyromegaly present.  Cardiovascular: Normal rate and regular rhythm.  Exam reveals no friction rub.   No murmur heard. Pulmonary/Chest: No stridor. No respiratory distress. She has no wheezes. She has no rales. She exhibits no tenderness.  Abdominal: She exhibits no distension and no mass. There is no tenderness. There is no rebound.  Genitourinary: Left adnexa normal.  Musculoskeletal: She exhibits no edema or tenderness.  Neurological: She is alert and oriented to person, place, and time. She has normal  reflexes. Gait normal. GCS score is 15.  Skin: Skin is warm and dry. No rash noted. She is not diaphoretic.  Psychiatric: Mood, memory and affect normal.  Nursing note and vitals reviewed.    LAB RESULTS:  No components found for: CBC  Lab data has been reviewed. SGOT and has a PT slightly abnormal with normal bilirubin.  SGOT is 78 SGPT is 101    Lab Results  Component Value Date   WBC 5.5 09/07/2014   NEUTROABS 2.7 09/07/2014   HGB 15.1 09/07/2014   HCT 44.5 09/07/2014   MCV 89.1 09/07/2014   PLT 282 09/07/2014      Chemistry          STUDIES: IMPRESSION: (September, 2016) No evidence of malignancy in either breast.  RECOMMENDATION: Bilateral screening mammogram in 1 year is recommended.  I have discussed the findings and recommendations with the patient. Results were also provided in writing at the conclusion of the visit. If applicable, a reminder letter will be sent to the patient regarding the next appointment.  BI-RADS CATEGORY 2: Benign.  ASSESSMENT: 1.  Follicular Lymphoma.  status post maintenance rituximab therapy All the lab data has been reviewed.\ Liver enzymes are slightly abnormal will be repeated in 3 months if it keeps him rising up his CT scan would be order otherwise CT scan would be done in 6 month for restaging of lymphoma  PLAN:  Continue follow-up.  In 6 months with lab.  Or before patient was advised to call us if there is any significant change  Patient expressed understanding and was in agreement with this plan. She also understands that She can call clinic at any time with any questions, concerns, or complaints.      Forest Gleason, MD   03/10/2015 12:10 PM

## 2015-03-14 ENCOUNTER — Encounter: Payer: Self-pay | Admitting: Oncology

## 2015-03-15 ENCOUNTER — Other Ambulatory Visit: Payer: Self-pay | Admitting: *Deleted

## 2015-03-15 DIAGNOSIS — R748 Abnormal levels of other serum enzymes: Secondary | ICD-10-CM

## 2015-03-15 DIAGNOSIS — C8590 Non-Hodgkin lymphoma, unspecified, unspecified site: Secondary | ICD-10-CM

## 2015-03-30 ENCOUNTER — Ambulatory Visit
Admission: RE | Admit: 2015-03-30 | Discharge: 2015-03-30 | Disposition: A | Discharge status: Home / Self Care | Payer: Medicare Other | Source: Ambulatory Visit | Attending: Gastroenterology | Admitting: Gastroenterology

## 2015-03-30 ENCOUNTER — Ambulatory Visit: Payer: Medicare Other | Admitting: Anesthesiology

## 2015-03-30 ENCOUNTER — Encounter
Admission: RE | Disposition: A | Discharge status: Home / Self Care | Payer: Self-pay | Source: Ambulatory Visit | Attending: Gastroenterology

## 2015-03-30 ENCOUNTER — Encounter: Payer: Self-pay | Admitting: *Deleted

## 2015-03-30 DIAGNOSIS — K319 Disease of stomach and duodenum, unspecified: Secondary | ICD-10-CM | POA: Insufficient documentation

## 2015-03-30 DIAGNOSIS — K2270 Barrett's esophagus without dysplasia: Secondary | ICD-10-CM | POA: Insufficient documentation

## 2015-03-30 DIAGNOSIS — J449 Chronic obstructive pulmonary disease, unspecified: Secondary | ICD-10-CM | POA: Diagnosis not present

## 2015-03-30 DIAGNOSIS — K2970 Gastritis, unspecified, without bleeding: Secondary | ICD-10-CM | POA: Insufficient documentation

## 2015-03-30 DIAGNOSIS — G43909 Migraine, unspecified, not intractable, without status migrainosus: Secondary | ICD-10-CM | POA: Diagnosis not present

## 2015-03-30 DIAGNOSIS — Z8572 Personal history of non-Hodgkin lymphomas: Secondary | ICD-10-CM | POA: Diagnosis not present

## 2015-03-30 DIAGNOSIS — B001 Herpesviral vesicular dermatitis: Secondary | ICD-10-CM | POA: Insufficient documentation

## 2015-03-30 DIAGNOSIS — Z79899 Other long term (current) drug therapy: Secondary | ICD-10-CM | POA: Diagnosis not present

## 2015-03-30 DIAGNOSIS — Z85828 Personal history of other malignant neoplasm of skin: Secondary | ICD-10-CM | POA: Insufficient documentation

## 2015-03-30 DIAGNOSIS — J45909 Unspecified asthma, uncomplicated: Secondary | ICD-10-CM | POA: Diagnosis not present

## 2015-03-30 DIAGNOSIS — K449 Diaphragmatic hernia without obstruction or gangrene: Secondary | ICD-10-CM | POA: Diagnosis not present

## 2015-03-30 DIAGNOSIS — M1990 Unspecified osteoarthritis, unspecified site: Secondary | ICD-10-CM | POA: Diagnosis not present

## 2015-03-30 DIAGNOSIS — Z87891 Personal history of nicotine dependence: Secondary | ICD-10-CM | POA: Diagnosis not present

## 2015-03-30 HISTORY — PX: ESOPHAGOGASTRODUODENOSCOPY (EGD) WITH PROPOFOL: SHX5813

## 2015-03-30 SURGERY — ESOPHAGOGASTRODUODENOSCOPY (EGD) WITH PROPOFOL
Anesthesia: General

## 2015-03-30 MED ORDER — PROPOFOL 10 MG/ML IV BOLUS
INTRAVENOUS | Status: DC | PRN
  Administered 2015-03-30: 50 mg via INTRAVENOUS

## 2015-03-30 MED ORDER — MIDAZOLAM HCL 5 MG/5ML IJ SOLN
INTRAMUSCULAR | Status: DC | PRN
  Administered 2015-03-30: 1 mg via INTRAVENOUS

## 2015-03-30 MED ORDER — SODIUM CHLORIDE 0.9 % IV SOLN: INTRAVENOUS | Status: DC

## 2015-03-30 MED ORDER — PROPOFOL 500 MG/50ML IV EMUL
INTRAVENOUS | Status: DC | PRN
Start: 2015-03-30 — End: 2015-03-30
  Administered 2015-03-30: 160 ug/kg/min via INTRAVENOUS

## 2015-03-30 MED ORDER — LIDOCAINE HCL (CARDIAC) 20 MG/ML IV SOLN
INTRAVENOUS | Status: DC | PRN
  Administered 2015-03-30: 80 mg via INTRAVENOUS

## 2015-03-30 MED ORDER — SODIUM CHLORIDE 0.9 % IV SOLN
INTRAVENOUS | Status: DC
  Administered 2015-03-30: 1000 mL via INTRAVENOUS

## 2015-03-30 MED ORDER — FENTANYL CITRATE (PF) 100 MCG/2ML IJ SOLN
INTRAMUSCULAR | Status: DC | PRN
  Administered 2015-03-30: 50 ug via INTRAVENOUS

## 2015-03-30 MED ORDER — IPRATROPIUM-ALBUTEROL 0.5-2.5 (3) MG/3ML IN SOLN
3.0000 mL | Freq: Once | RESPIRATORY_TRACT | Status: AC
  Administered 2015-03-30: 3 mL via RESPIRATORY_TRACT

## 2015-03-30 MED ORDER — IPRATROPIUM-ALBUTEROL 0.5-2.5 (3) MG/3ML IN SOLN
RESPIRATORY_TRACT | Status: AC
Start: 2015-03-30 — End: 2015-03-30
  Administered 2015-03-30: 15:00:00 via ORAL
  Filled 2015-03-30: qty 3

## 2015-03-30 NOTE — Transfer of Care (Signed)
Immediate Anesthesia Transfer of Care Note  Patient: [name]  Procedure(s) Performed: Procedure(s): ESOPHAGOGASTRODUODENOSCOPY (EGD) WITH PROPOFOL (N/A)  Patient Location: PACU  Anesthesia Type:General  Level of Consciousness: sedated  Airway & Oxygen Therapy: Patient Spontanous Breathing and Patient connected to face mask oxygen  Post-op Assessment: Report given to RN and Post -op Vital signs reviewed and stable  Post vital signs: Reviewed and stable  Last Vitals:  Filed Vitals:   03/30/15 1522 03/30/15 1523  BP: 145/89 145/89  Pulse:  81  Temp: 35.6 C 36.8 C  Resp: 16 16    Complications: No apparent anesthesia complications

## 2015-03-30 NOTE — Anesthesia Postprocedure Evaluation (Signed)
Anesthesia Post Note  Patient: [name]  Procedure(s) Performed: Procedure(s) (LRB): ESOPHAGOGASTRODUODENOSCOPY (EGD) WITH PROPOFOL (N/A)  Patient location during evaluation: Endoscopy Anesthesia Type: General Level of consciousness: awake, awake and alert and oriented Pain management: pain level controlled Vital Signs Assessment: post-procedure vital signs reviewed and stable Respiratory status: spontaneous breathing Cardiovascular status: blood pressure returned to baseline Anesthetic complications: no    Last Vitals:  Filed Vitals:   03/30/15 1543 03/30/15 1552  BP: 151/84 157/79  Pulse: 76 74  Temp:    Resp: 15 16    Last Pain: There were no vitals filed for this visit.               Nolon Yellin

## 2015-03-30 NOTE — Anesthesia Procedure Notes (Signed)
Date/Time: 03/30/2015 3:00 PM Performed by: Doreen Salvage Pre-anesthesia Checklist: Patient identified, Emergency Drugs available, Suction available and Patient being monitored Patient Re-evaluated:Patient Re-evaluated prior to inductionOxygen Delivery Method: Nasal cannula Intubation Type: IV induction Dental Injury: Teeth and Oropharynx as per pre-operative assessment  Comments: Nasal cannula with etCO2 monitoring

## 2015-03-30 NOTE — Op Note (Signed)
[facility] Gastroenterology Patient Name: [name] Procedure Date: 03/30/2015 2:54 PM MRN: [id number] Account #: [id number] Date of Birth: Jul 31, 1940 Admit Type: Outpatient Age: 74 Room: [facility] ENDO ROOM 3 Gender: Female Note Status: Finalized Procedure:         Upper GI endoscopy Indications:       Follow-up of Barrett's esophagus Providers:         Lollie Sails, MD Referring MD:      Precious Bard, MD (Referring MD) Medicines:         Monitored Anesthesia Care Complications:     No immediate complications. Procedure:         Pre-Anesthesia Assessment:                    - ASA Grade Assessment: III - A patient with severe                     systemic disease.                    After obtaining informed consent, the endoscope was passed                     under direct vision. Throughout the procedure, the                     patient's blood pressure, pulse, and oxygen saturations                     were monitored continuously. The Endoscope was introduced                     through the mouth, and advanced to the third part of                     duodenum. The upper GI endoscopy was accomplished without                     difficulty. The patient tolerated the procedure well. Findings:      The Z-line was irregular.      There were esophageal mucosal changes consistent with short-segment       Barrett's esophagus present at the gastroesophageal junction. The       maximum longitudinal extent of these mucosal changes was 1 cm in length.       Mucosa was biopsied with a cold forceps for histology in 4 quadrants at       36 cm from the incisors.      The exam of the esophagus was otherwise normal.      A small hiatus hernia was present.      Diffuse mildly erythematous mucosa without bleeding was found in the       gastric body. Biopsies were taken with a cold forceps for histology.       Biopsies were taken with a cold forceps for Helicobacter  pylori testing.      The cardia and gastric fundus were normal on retroflexion.      The examined duodenum was normal. Impression:        - Z-line irregular.                    - Esophageal mucosal changes consistent with short-segment  Barrett's esophagus. Biopsied.                    - Small hiatus hernia.                    - Erythematous mucosa in the gastric body. Biopsied.                    - Normal examined duodenum. Recommendation:    - Discharge patient to home.                    - Continue present medications.                    - Await pathology results.                    - Telephone GI clinic for pathology results in 1 week. Procedure Code(s): --- Professional ---                    [phone], Esophagogastroduodenoscopy, flexible, transoral;                     with biopsy, single or multiple Diagnosis Code(s): --- Professional ---                    K22.8, Other specified diseases of esophagus                    K22.70, Barrett's esophagus without dysplasia                    K44.9, Diaphragmatic hernia without obstruction or gangrene                    K31.89, Other diseases of stomach and duodenum CPT copyright 2014 American Medical Association. All rights reserved. The codes documented in this report are preliminary and upon coder review may  be revised to meet current compliance requirements. Lollie Sails, MD 03/30/2015 3:22:52 PM This report has been signed electronically. Number of Addenda: 0 Note Initiated On: 03/30/2015 2:54 PM      [facility]

## 2015-03-30 NOTE — H&P (Signed)
Outpatient short stay form Pre-procedure 03/30/2015 2:50 PM Lollie Sails MD  Primary Physician: Jackelyn Poling, NP  Reason for visit:  History of Barrett's esophagus  History of present illness:  Patient is a 74 year old female presenting today for EGD in regards to follow-up of her personal history of Barrett's esophagus. Her last EGD was in 2014. This was positive by histology for Barrett's esophagus but negative for dysplasia. She had evidence of short segment Barrett's esophagus. She denies any problems with heartburn or dysphagia. Tolerating her omeprazole which she takes well. There is no abdominal pain. She takes no aspirin or blood thinner products.    Current facility-administered medications:  .  0.9 %  sodium chloride infusion, , Intravenous, Continuous, Lollie Sails, MD, Last Rate: 20 mL/hr at 03/30/15 1432, 1,000 mL at 03/30/15 1432 .  0.9 %  sodium chloride infusion, , Intravenous, Continuous, Lollie Sails, MD  Prescriptions prior to admission  Medication Sig Dispense Refill Last Dose  . loratadine (CLARITIN) 10 MG tablet Take 10 mg by mouth every morning.    03/30/2015 at 630am  . Omeprazole (RA OMEPRAZOLE) 20 MG TBEC Take 20 mg by mouth.   03/30/2015 at 630amnknown time  . albuterol (PROVENTIL HFA;VENTOLIN HFA) 108 (90 BASE) MCG/ACT inhaler Inhale 2 puffs into the lungs every 6 (six) hours as needed for wheezing or shortness of breath.   Completed Course at Unknown time  . loperamide (IMODIUM A-D) 2 MG tablet Take 2 mg by mouth 4 (four) times daily as needed for diarrhea or loose stools.   Taking     Allergies  Allergen Reactions  . No Known Allergies   . Buprenorphine Hcl Nausea And Vomiting  . Morphine And Related Nausea And Vomiting     Past Medical History  Diagnosis Date  . Non Hodgkin's lymphoma ([place])   . Barrett esophagus   . Skin cancer   . Asthma   . Migraine   . 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 Allergy    seasonal  . 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 Arthritis     degenerative of hip right    Review of systems:      Physical Exam    Heart and lungs: Regular rate and rhythm without rub or gallop, lungs are bilaterally clear.    HEENT: Normocephalic atraumatic eyes are anicteric.    Other:     Pertinant exam for procedure: Soft nontender nondistended bowel sounds positive and normoactive.    Planned proceedures: EGD and indicated procedures. I have discussed the risks benefits and complications of procedures to include not limited to bleeding, infection, perforation and the risk of sedation and the patient wishes to proceed.    Lollie Sails, MD Gastroenterology 03/30/2015  2:50 PM

## 2015-03-30 NOTE — Anesthesia Preprocedure Evaluation (Signed)
Anesthesia Evaluation  Patient identified by MRN, date of birth, ID band Patient awake    Reviewed: Allergy & Precautions, H&P , NPO status , Patient's Chart, lab work & pertinent test results  History of Anesthesia Complications Negative for: history of anesthetic complications  Airway Mallampati: III  TM Distance: >3 FB Neck ROM: limited    Dental  (+) Poor Dentition, Chipped   Pulmonary asthma , COPD, former smoker (worked around smokers),    Pulmonary exam normal breath sounds clear to auscultation       Cardiovascular Exercise Tolerance: Good (-) angina(-) Past MI and (-) DOE negative cardio ROS Normal cardiovascular exam Rhythm:regular Rate:Normal     Neuro/Psych  Headaches, negative psych ROS   GI/Hepatic Neg liver ROS, GERD  Controlled,  Endo/Other  negative endocrine ROS  Renal/GU negative Renal ROS  negative genitourinary   Musculoskeletal  (+) Arthritis ,   Abdominal   Peds  Hematology negative hematology ROS (+)   Anesthesia Other Findings Past Medical History:   Non Hodgkin's lymphoma ([facility])                                 Barrett esophagus                                            Skin cancer                                                  Asthma                                                       Migraine                                                     HSV (herpes simplex virus) infection                           Comment:of upper lip   Allergy                                                        Comment:seasonal   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     Arthritis                                                      Comment:degenerative of hip right  Past Surgical History:   lymph node removal from left groin  CHOLECYSTECTOMY                                               FACIAL COSMETIC SURGERY                                      APPENDECTOMY                                                  ABDOMINAL HYSTERECTOMY                                        history of multiple colonic polyps                            BASAL CELL CARCINOMA EXCISION                                   Comment:on both sides of nose   VENTRAL HERNIA REPAIR                           N/A 10/02/2014      Comment:Procedure: HERNIA REPAIR VENTRAL ADULT;                Surgeon: Leonie Green, MD;  Location:               [facility] ORS;  Service: General;  Laterality: N/A;               with mesh   BREAST BIOPSY                                   Left 12/2013         Comment:hyperplasia     Reproductive/Obstetrics negative OB ROS                             Anesthesia Physical Anesthesia Plan  ASA: III  Anesthesia Plan: General   Post-op Pain Management:    Induction:   Airway Management Planned:   Additional Equipment:   Intra-op Plan:   Post-operative Plan:   Informed Consent: I have reviewed the patients History and Physical, chart, labs and discussed the procedure including the risks, benefits and alternatives for the proposed anesthesia with the patient or authorized representative who has indicated his/her understanding and acceptance.   Dental Advisory Given  Plan Discussed with: Anesthesiologist, CRNA and Surgeon  Anesthesia Plan Comments:         Anesthesia Quick Evaluation

## 2015-03-31 ENCOUNTER — Encounter: Payer: Self-pay | Admitting: Gastroenterology

## 2015-04-04 LAB — SURGICAL PATHOLOGY

## 2015-06-15 ENCOUNTER — Inpatient Hospital Stay: Payer: Medicare Other | Attending: Oncology

## 2015-07-27 ENCOUNTER — Encounter: Payer: Self-pay | Admitting: Podiatry

## 2015-07-27 ENCOUNTER — Ambulatory Visit (INDEPENDENT_AMBULATORY_CARE_PROVIDER_SITE_OTHER): Payer: Medicare Other | Admitting: Podiatry

## 2015-07-27 VITALS — BP 153/89 | HR 88 | Resp 18

## 2015-07-27 DIAGNOSIS — L600 Ingrowing nail: Secondary | ICD-10-CM

## 2015-07-27 DIAGNOSIS — B351 Tinea unguium: Secondary | ICD-10-CM | POA: Diagnosis not present

## 2015-07-27 NOTE — Addendum Note (Signed)
Addended by: Cranford Mon R on: 07/27/2015 12:33 PM   Modules accepted: Orders

## 2015-07-27 NOTE — Progress Notes (Signed)
   Subjective:    Patient ID: [name], female    DOB: 06/28/1940, 75 y.o.   MRN: [id number]  HPI  75 year old female presents the office today for concerns of right big toenail pain. She states that several years ago she did have an infection to the area after getting a pedicure. She states she gets ingrown toenails and she gets pedicures done. Over the last couple weeks the toenails being very sore however it has resolved but she does continue gets ingrown toenail. She denies any drainage or pus. Denies any redness or swelling. No other complaints.   Her blood pressure is elevated today. She states she has a lot of stress at home.    Review of Systems  All other systems reviewed and are negative.      Objective:   Physical Exam General: AAO x3, NAD  Dermatological: There is incurvation of both the medial and lateral nail borders of the right hallux toenail with tenderness to palpation upon the nail borders. There is localized edema to the nail borders without any erythema, increase in warmth. There is no drainage/. There is no tenderness the remaining toenails. No open lesions identified bilaterally.  Vascular: DP/PT pulses 2/4, CRT less than 3 seconds, pedal hair present. There is no pain with calf compression, swelling, warmth, erythema.   Neruologic: Grossly intact via light touch bilateral. Vibratory intact via tuning fork bilateral. Protective threshold with Semmes Wienstein monofilament intact to all pedal sites bilateral. Patellar and Achilles deep tendon reflexes 2+ bilateral. No Babinski or clonus noted bilateral.   Musculoskeletal: No gross boney pedal deformities bilateral. No pain, crepitus, or limitation noted with foot and ankle range of motion bilateral. Muscular strength 5/5 in all groups tested bilateral.  Gait: Unassisted, Nonantalgic.      Assessment & Plan:   75 year old female with symptomatic ingrown toenail right medial and lateral nail borders.    -Treatment options discussed including all alternatives, risks, and complications -Etiology of symptoms were discussed -At this time, the patient is requesting partial nail removal with chemical matricectomy to the symptomatic portion of the nail. Risks and complications were discussed with the patient for which they understand and  verbally consent to the procedure. Under sterile conditions a total of 3 mL of a mixture of 2% lidocaine plain and 0.5% Marcaine plain was infiltrated in a hallux block fashion. Once anesthetized, the skin was prepped in sterile fashion. A tourniquet was then applied. Next the medial and lateral aspect of hallux nail borders were then sharply excised making sure to remove the entire offending nail border. Once the nails were ensured to be removed area was debrided and the underlying skin was intact. There is no purulence identified in the procedure. Next phenol was then applied under standard conditions and copiously irrigated. Silvadene was applied. A dry sterile dressing was applied. After application of the dressing the tourniquet was removed and there is found to be an immediate capillary refill time to the digit. The patient tolerated the procedure well any complications. Post procedure instructions were discussed the patient for which he verbally understood. Follow-up in one week for nail check or sooner if any problems are to arise. Discussed signs/symptoms of infection and directed to call the office immediately should any occur or go directly to the emergency room. In the meantime, encouraged to call the office with any questions, concerns, changes symptoms.  Celesta Gentile, DPM

## 2015-07-27 NOTE — Patient Instructions (Signed)

## 2015-08-03 ENCOUNTER — Ambulatory Visit (INDEPENDENT_AMBULATORY_CARE_PROVIDER_SITE_OTHER): Payer: Medicare Other | Admitting: Podiatry

## 2015-08-03 DIAGNOSIS — L600 Ingrowing nail: Secondary | ICD-10-CM

## 2015-08-03 DIAGNOSIS — Z9889 Other specified postprocedural states: Secondary | ICD-10-CM

## 2015-08-03 NOTE — Patient Instructions (Signed)

## 2015-08-03 NOTE — Progress Notes (Signed)
Patient ID: MYA REUM, female   DOB: 1941-01-15, 75 y.o.   MRN: [id number]  Subjective: [name] is a 75 y.o.  female returns to office today for follow up evaluation after having right hallux medial and lateral permanent partial nail avulsion performed. Patient has been soaking using epsom salts. She has not been covering with antibiotic ointment or Band-Aid. Patient denies fevers, chills, nausea, vomiting. Denies any calf pain, chest pain, SOB.   Objective:  Vitals: Reviewed  General: Well developed, nourished, in no acute distress, alert and oriented x3   Dermatology: Skin is warm, dry and supple bilateral. Medial and lateral hallux nail border appears to be clean, dry, with mild granular tissue and surrounding scab. There is no surrounding erythema, edema, drainage/purulence. The remaining nails appear unremarkable at this time. There are no other lesions or other signs of infection present.  Neurovascular status: Intact. No lower extremity swelling; No pain with calf compression bilateral.  Musculoskeletal: Decreased tenderness to palpation of the medial and lateral hallux nail fold. Muscular strength within normal limits bilateral.   Assesement and Plan: S/p partial nail avulsion, doing well.   -Continue soaking in epsom salts twice a day followed by antibiotic ointment and a band-aid. Can leave uncovered at night. Continue this until completely healed.  -If the area has not healed in 2 weeks, call the office for follow-up appointment, or sooner if any problems arise.  -Monitor for any signs/symptoms of infection. Call the office immediately if any occur or go directly to the emergency room. Call with any questions/concerns.  Celesta Gentile, DPM

## 2015-08-10 ENCOUNTER — Ambulatory Visit
Admission: RE | Admit: 2015-08-10 | Discharge: 2015-08-10 | Disposition: A | Discharge status: Home / Self Care | Payer: Medicare Other | Source: Ambulatory Visit | Attending: Oncology | Admitting: Oncology

## 2015-08-10 DIAGNOSIS — K760 Fatty (change of) liver, not elsewhere classified: Secondary | ICD-10-CM | POA: Diagnosis not present

## 2015-08-10 DIAGNOSIS — Z90710 Acquired absence of both cervix and uterus: Secondary | ICD-10-CM | POA: Diagnosis not present

## 2015-08-10 DIAGNOSIS — C8299 Follicular lymphoma, unspecified, extranodal and solid organ sites: Secondary | ICD-10-CM

## 2015-08-10 DIAGNOSIS — R160 Hepatomegaly, not elsewhere classified: Secondary | ICD-10-CM | POA: Insufficient documentation

## 2015-08-10 DIAGNOSIS — Z9049 Acquired absence of other specified parts of digestive tract: Secondary | ICD-10-CM | POA: Diagnosis not present

## 2015-08-10 DIAGNOSIS — C8589 Other specified types of non-Hodgkin lymphoma, extranodal and solid organ sites: Secondary | ICD-10-CM | POA: Diagnosis not present

## 2015-08-10 LAB — POCT I-STAT CREATININE: CREATININE: 0.8 mg/dL (ref 0.44–1.00)

## 2015-08-10 MED ORDER — IOPAMIDOL (ISOVUE-300) INJECTION 61%
100.0000 mL | Freq: Once | INTRAVENOUS | Status: AC | PRN
  Administered 2015-08-10: 100 mL via INTRAVENOUS

## 2015-08-12 ENCOUNTER — Ambulatory Visit: Payer: Medicare Other | Admitting: Oncology

## 2015-08-12 ENCOUNTER — Other Ambulatory Visit: Payer: Medicare Other

## 2015-08-17 ENCOUNTER — Ambulatory Visit (INDEPENDENT_AMBULATORY_CARE_PROVIDER_SITE_OTHER): Payer: Medicare Other | Admitting: Podiatry

## 2015-08-17 ENCOUNTER — Encounter: Payer: Self-pay | Admitting: Podiatry

## 2015-08-17 VITALS — BP 155/95 | HR 84 | Resp 18

## 2015-08-17 DIAGNOSIS — B351 Tinea unguium: Secondary | ICD-10-CM

## 2015-08-17 MED ORDER — TERBINAFINE HCL 250 MG PO TABS: 250.0000 mg | ORAL_TABLET | Freq: Every day | ORAL | Status: DC

## 2015-08-17 NOTE — Progress Notes (Signed)
Patient ID: [name], female   DOB: 19-Apr-1941, 75 y.o.   MRN: [id number]  Subjective: [name] is a 75 y.o.  female returns to office today to discuss nail culture results. She said that she is doing well from the nail procedure site and she did not have any pain or drainage. She does keep Neosporin over the area.  Patient denies fevers, chills, nausea, vomiting. Denies any calf pain, chest pain, SOB.   Objective:  Vitals: Reviewed  General: Well developed, nourished, in no acute distress, alert and oriented x3   Dermatology: Skin is warm, dry and supple bilateral. Medial and lateral hallux nail border appears to be clean, dry, with surrounding scab. There is no surrounding erythema, edema, drainage/purulence. The remaining nails appear unremarkable at this time. There are no other lesions or other signs of infection present. Nails are very somewhat dystrophic, discolored.  Neurovascular status: Intact. No lower extremity swelling; No pain with calf compression bilateral.  Musculoskeletal: Currently no tenderness to palpation of the medial and lateral hallux nail fold. Muscular strength within normal limits bilateral.   Assesement and Plan: S/p partial nail avulsion, doing well; onychomycosis  -Nail culture results were discussed the patient. Discussed treatment options and she is left with topical treatment. Prescribed compound cream for onychomycosis. -Monitor for any signs/symptoms of infection. Call the office immediately if any occur or go directly to the emergency room. Call with any questions/concerns.  Celesta Gentile, DPM

## 2015-08-26 ENCOUNTER — Inpatient Hospital Stay (HOSPITAL_BASED_OUTPATIENT_CLINIC_OR_DEPARTMENT_OTHER): Payer: Medicare Other | Admitting: Oncology

## 2015-08-26 ENCOUNTER — Inpatient Hospital Stay: Payer: Medicare Other | Attending: Oncology

## 2015-08-26 VITALS — BP 154/99 | HR 88 | Temp 96.9°F | Resp 18 | Wt 187.2 lb

## 2015-08-26 DIAGNOSIS — M129 Arthropathy, unspecified: Secondary | ICD-10-CM | POA: Diagnosis not present

## 2015-08-26 DIAGNOSIS — Z9221 Personal history of antineoplastic chemotherapy: Secondary | ICD-10-CM

## 2015-08-26 DIAGNOSIS — C8220 Follicular lymphoma grade III, unspecified, unspecified site: Secondary | ICD-10-CM

## 2015-08-26 DIAGNOSIS — Z8669 Personal history of other diseases of the nervous system and sense organs: Secondary | ICD-10-CM | POA: Diagnosis not present

## 2015-08-26 DIAGNOSIS — K769 Liver disease, unspecified: Secondary | ICD-10-CM | POA: Insufficient documentation

## 2015-08-26 DIAGNOSIS — K2270 Barrett's esophagus without dysplasia: Secondary | ICD-10-CM | POA: Insufficient documentation

## 2015-08-26 DIAGNOSIS — J45909 Unspecified asthma, uncomplicated: Secondary | ICD-10-CM | POA: Insufficient documentation

## 2015-08-26 DIAGNOSIS — C8299 Follicular lymphoma, unspecified, extranodal and solid organ sites: Secondary | ICD-10-CM

## 2015-08-26 DIAGNOSIS — Z79899 Other long term (current) drug therapy: Secondary | ICD-10-CM

## 2015-08-26 DIAGNOSIS — Z85828 Personal history of other malignant neoplasm of skin: Secondary | ICD-10-CM | POA: Diagnosis not present

## 2015-08-26 DIAGNOSIS — C8590 Non-Hodgkin lymphoma, unspecified, unspecified site: Secondary | ICD-10-CM

## 2015-08-26 LAB — COMPREHENSIVE METABOLIC PANEL
ALT: 145 U/L — AB (ref 14–54)
AST: 128 U/L — AB (ref 15–41)
Albumin: 4.2 g/dL (ref 3.5–5.0)
Alkaline Phosphatase: 66 U/L (ref 38–126)
Anion gap: 4 — ABNORMAL LOW (ref 5–15)
BUN: 19 mg/dL (ref 6–20)
CO2: 25 mmol/L (ref 22–32)
CREATININE: 0.88 mg/dL (ref 0.44–1.00)
Calcium: 8.8 mg/dL — ABNORMAL LOW (ref 8.9–10.3)
Chloride: 105 mmol/L (ref 101–111)
Glucose, Bld: 148 mg/dL — ABNORMAL HIGH (ref 65–99)
POTASSIUM: 3.5 mmol/L (ref 3.5–5.1)
SODIUM: 134 mmol/L — AB (ref 135–145)
Total Bilirubin: 0.9 mg/dL (ref 0.3–1.2)
Total Protein: 7 g/dL (ref 6.5–8.1)

## 2015-08-26 LAB — CBC WITH DIFFERENTIAL/PLATELET
BASOS ABS: 0 10*3/uL (ref 0–0.1)
Basophils Relative: 1 %
EOS ABS: 0.1 10*3/uL (ref 0–0.7)
EOS PCT: 1 %
HCT: 41.5 % (ref 35.0–47.0)
Hemoglobin: 14.3 g/dL (ref 12.0–16.0)
Lymphocytes Relative: 45 %
Lymphs Abs: 2.7 10*3/uL (ref 1.0–3.6)
MCH: 31.1 pg (ref 26.0–34.0)
MCHC: 34.6 g/dL (ref 32.0–36.0)
MCV: 89.9 fL (ref 80.0–100.0)
MONO ABS: 0.5 10*3/uL (ref 0.2–0.9)
Monocytes Relative: 9 %
Neutro Abs: 2.6 10*3/uL (ref 1.4–6.5)
Neutrophils Relative %: 44 %
PLATELETS: 230 10*3/uL (ref 150–440)
RBC: 4.61 MIL/uL (ref 3.80–5.20)
RDW: 13.1 % (ref 11.5–14.5)
WBC: 5.9 10*3/uL (ref 3.6–11.0)

## 2015-08-26 LAB — SEDIMENTATION RATE: SED RATE: 9 mm/h (ref 0–30)

## 2015-08-26 LAB — LACTATE DEHYDROGENASE: LDH: 187 U/L (ref 98–192)

## 2015-08-27 ENCOUNTER — Encounter: Payer: Self-pay | Admitting: Oncology

## 2015-08-27 NOTE — Progress Notes (Signed)
[place] @ [facility] [phone]) [phone]  Fax:([phone]) [place]: 1940/12/31  MR#: [id number]  [id number]  Patient Care Team: Marinda Elk, MD as PCP - General (Physician Assistant)  CHIEF COMPLAINT:  Chief Complaint  Patient presents with  . Lymphoma    Oncology History   Chief Complaint/Diagnosis:   1. Mesenteric adenopathy concerning for lyphoma. Pet positive lymphadenopathy. 2. 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 3. Rituxan weekly x4 finished in March of 2013. 4. PET scan in July of 2013 complete remission. 5. Maintenance Rituxan started in July of 2013. 6.Abnormal mammogram in August of 2015 biopsy has been reported to be negative for malignanc     Lymphoma, non-Hodgkin's ([place])   06/02/2014 Initial Diagnosis Lymphoma, non-Hodgkin's    Oncology Flowsheet 10/02/2014  ondansetron (ZOFRAN) IV -    INTERVAL HISTORY:  75 year old lady came today further follow-up regarding low-grade lymphoma stage III disease.  Patient does not have any chills fever no abdominal pain no nausea no vomiting or diarrhea. \ No chills.  No fever.  Appetite has been stable. Patient came today further follow-up regarding lymphoma. No abdominal pain no nausea no vomiting patient had a follow-up CT scan abdomen and pelvis here for further evaluation and treatment consideration  REVIEW OF SYSTEMS:   Review of Systems  Constitutional: Negative for fever, chills, weight loss, malaise/fatigue and diaphoresis.  HENT: Negative for congestion, ear discharge, ear pain, hearing loss, nosebleeds, sore throat and tinnitus.   Eyes: Negative for blurred vision, double vision, photophobia, pain, discharge and redness.  Respiratory: Negative for cough, hemoptysis, sputum production, shortness of breath, wheezing and stridor.   Cardiovascular: Negative for chest pain, palpitations, orthopnea, claudication, leg swelling and PND.  Gastrointestinal: Negative for  heartburn, nausea, vomiting, abdominal pain, diarrhea, constipation, blood in stool and melena.  Genitourinary: Negative for dysuria, urgency, frequency, hematuria and flank pain.  Musculoskeletal: Negative for myalgias, back pain, joint pain, falls and neck pain.  Skin: Negative for itching and rash.  Neurological: Negative for dizziness, tingling, tremors, sensory change, speech change, focal weakness, seizures, loss of consciousness, weakness and headaches.  Endo/Heme/Allergies: Negative for environmental allergies and polydipsia. Does not bruise/bleed easily.  Psychiatric/Behavioral: Negative for depression, suicidal ideas, hallucinations, memory loss and substance abuse. The patient is not nervous/anxious and does not have insomnia.   All other systems reviewed and are negative.   As per HPI. Otherwise, a complete review of systems is negatve.  PAST MEDICAL HISTORY: Past Medical History  Diagnosis Date  . Non Hodgkin's lymphoma ([place])   . Barrett esophagus   . Skin cancer   . Asthma   . Migraine   . 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 Allergy     seasonal  . 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 Arthritis     degenerative of hip right    PAST SURGICAL HISTORY: Past Surgical History  Procedure Laterality Date  . Lymph node removal from left groin    . Cholecystectomy    . Facial cosmetic surgery    . Appendectomy    . Abdominal hysterectomy    . History of multiple colonic polyps    . Basal cell carcinoma excision      on both sides of nose  . Ventral hernia repair N/A 10/02/2014    Procedure: HERNIA REPAIR VENTRAL ADULT;  Surgeon: Leonie Green, MD;  Location: [facility] ORS;  Service: General;  Laterality: N/A;  with mesh  . Breast biopsy Left 12/2013    hyperplasia  . Esophagogastroduodenoscopy (egd) with propofol N/A 03/30/2015    Procedure: ESOPHAGOGASTRODUODENOSCOPY (EGD) WITH PROPOFOL;  Surgeon: Lollie Sails, MD;  Location: [facility] ENDOSCOPY;  Service: Endoscopy;  Laterality: N/A;    FAMILY HISTORY    No significant family history of malignancy.   ADVANCED DIRECTIVES:   Advance Directive ([place]) yes   Do you want to revise or change your advance directive? No    HEALTH MAINTENANCE: Social History  Substance Use Topics  . Smoking status: Never Smoker   . Smokeless tobacco: Never Used  . Alcohol Use: 0.6 oz/week    1 Cans of beer per week     Comment: rare social occasions only     Allergies  Allergen Reactions  . No Known Allergies   . Buprenorphine Hcl Nausea And Vomiting  . Morphine And Related Nausea And Vomiting    Current Outpatient Prescriptions  Medication Sig Dispense Refill  . albuterol (PROVENTIL HFA;VENTOLIN HFA) 108 (90 BASE) MCG/ACT inhaler Inhale 2 puffs into the lungs every 6 (six) hours as needed for wheezing or shortness of breath.    . loperamide (IMODIUM A-D) 2 MG tablet Take 2 mg by mouth 4 (four) times daily as needed for diarrhea or loose stools.    Marland Kitchen loratadine (CLARITIN) 10 MG tablet Take 10 mg by mouth every morning.     Marland Kitchen omeprazole (PRILOSEC) 20 MG capsule     . Omeprazole (RA OMEPRAZOLE) 20 MG TBEC Take 20 mg by mouth.     No current facility-administered medications for this visit.    OBJECTIVE: Filed Vitals:   08/26/15 1536  BP: 154/99  Pulse: 88  Temp: 96.9 F (36.1 C)  Resp: 18     Body mass index is 34.23 kg/(m^2).    ECOG FS:0 - Asymptomatic  Physical Exam  Constitutional: She is oriented to person, place, and time and well-developed, well-nourished, and in no distress. No distress.  HENT:  Head: Normocephalic and atraumatic.  Right Ear: External ear normal.  Left Ear: External ear normal.  Nose: Nose normal.  Mouth/Throat: Oropharynx is clear and moist.  Eyes: Conjunctivae and EOM are normal.  Neck: Normal range of motion. No JVD present. No tracheal deviation present. No thyromegaly present.    Cardiovascular: Normal rate and regular rhythm.  Exam reveals no friction rub.   No murmur heard. Pulmonary/Chest: No stridor. No respiratory distress. She has no wheezes. She has no rales. She exhibits no tenderness.  Abdominal: She exhibits no distension and no mass. There is no tenderness. There is no rebound.  Genitourinary: Left adnexa normal.  Musculoskeletal: She exhibits no edema or tenderness.  Neurological: She is alert and oriented to person, place, and time. She has normal reflexes. Gait normal. GCS score is 15.  Skin: Skin is warm and dry. No rash noted. She is not diaphoretic.  Psychiatric: Mood, memory and affect normal.  Nursing note and vitals reviewed.    LAB RESULTS:  No components found for: CBC  Lab data has been reviewed. SGOT and has a PT slightly abnormal with normal bilirubin.  SGOT is 78 SGPT is 101    Lab Results  Component Value Date   WBC 5.9 08/26/2015   NEUTROABS 2.6 08/26/2015   HGB 14.3 08/26/2015   HCT 41.5 08/26/2015   MCV 89.9 08/26/2015   PLT 230 08/26/2015      Chemistry  STUDIES: CT scan of abdomen and pelvis   ASSESSMENT: 1. Follicular Lymphoma.  status post maintenance rituximab therapy All the lab data has been reviewed.\ Liver enzymes are  Abnormal with a normal CT scan of abdomen and pelvis. CT scan has been reviewed independently and in comparison to 2014 liver lesion appears to be stable and most likely of benign etiology. Abnormal liver enzymes are most likely due to fatty liver 7 is getting regular colonoscopy CT scan has been reviewed independently reevaluate patient in 6 months.  In my absence patient will be evaluated by my associate. Patient is fully aware of my retirement plan  CT scan shows padding steatosis There are abnormal cystic-looking lesion in the liver which are stable PLAN:  Continue follow-up.  In 6 months with lab.  Or before patient was advised to call us if there is any significant  change  Patient expressed understanding and was in agreement with this plan. She also understands that She can call clinic at any time with any questions, concerns, or complaints.      Forest Gleason, MD   08/27/2015 6:55 PM

## 2015-09-07 ENCOUNTER — Other Ambulatory Visit: Payer: Self-pay | Admitting: Physician Assistant

## 2015-09-07 DIAGNOSIS — Z1231 Encounter for screening mammogram for malignant neoplasm of breast: Secondary | ICD-10-CM

## 2016-01-14 ENCOUNTER — Other Ambulatory Visit: Payer: Self-pay | Admitting: Physician Assistant

## 2016-01-14 ENCOUNTER — Ambulatory Visit
Admission: RE | Admit: 2016-01-14 | Discharge: 2016-01-14 | Disposition: A | Discharge status: Home / Self Care | Payer: Medicare Other | Source: Ambulatory Visit | Attending: Physician Assistant | Admitting: Physician Assistant

## 2016-01-14 DIAGNOSIS — Z1231 Encounter for screening mammogram for malignant neoplasm of breast: Secondary | ICD-10-CM | POA: Diagnosis not present

## 2016-02-21 ENCOUNTER — Other Ambulatory Visit: Payer: Self-pay

## 2016-02-21 DIAGNOSIS — C8590 Non-Hodgkin lymphoma, unspecified, unspecified site: Secondary | ICD-10-CM

## 2016-02-24 ENCOUNTER — Inpatient Hospital Stay: Payer: Medicare Other | Attending: Internal Medicine

## 2016-02-24 ENCOUNTER — Other Ambulatory Visit: Payer: Self-pay

## 2016-02-24 ENCOUNTER — Inpatient Hospital Stay (HOSPITAL_BASED_OUTPATIENT_CLINIC_OR_DEPARTMENT_OTHER): Payer: Medicare Other | Admitting: Internal Medicine

## 2016-02-24 DIAGNOSIS — M129 Arthropathy, unspecified: Secondary | ICD-10-CM | POA: Insufficient documentation

## 2016-02-24 DIAGNOSIS — Z9221 Personal history of antineoplastic chemotherapy: Secondary | ICD-10-CM | POA: Diagnosis not present

## 2016-02-24 DIAGNOSIS — Z8572 Personal history of non-Hodgkin lymphomas: Secondary | ICD-10-CM | POA: Diagnosis not present

## 2016-02-24 DIAGNOSIS — J45909 Unspecified asthma, uncomplicated: Secondary | ICD-10-CM

## 2016-02-24 DIAGNOSIS — Z8669 Personal history of other diseases of the nervous system and sense organs: Secondary | ICD-10-CM | POA: Diagnosis not present

## 2016-02-24 DIAGNOSIS — R7989 Other specified abnormal findings of blood chemistry: Secondary | ICD-10-CM | POA: Insufficient documentation

## 2016-02-24 DIAGNOSIS — Z85828 Personal history of other malignant neoplasm of skin: Secondary | ICD-10-CM

## 2016-02-24 DIAGNOSIS — C8590 Non-Hodgkin lymphoma, unspecified, unspecified site: Secondary | ICD-10-CM

## 2016-02-24 DIAGNOSIS — C8203 Follicular lymphoma grade I, intra-abdominal lymph nodes: Secondary | ICD-10-CM

## 2016-02-24 LAB — COMPREHENSIVE METABOLIC PANEL
ALBUMIN: 4.1 g/dL (ref 3.5–5.0)
ALK PHOS: 70 U/L (ref 38–126)
ALT: 83 U/L — AB (ref 14–54)
AST: 70 U/L — AB (ref 15–41)
Anion gap: 10 (ref 5–15)
BILIRUBIN TOTAL: 0.7 mg/dL (ref 0.3–1.2)
BUN: 17 mg/dL (ref 6–20)
CALCIUM: 9.3 mg/dL (ref 8.9–10.3)
CO2: 25 mmol/L (ref 22–32)
CREATININE: 0.77 mg/dL (ref 0.44–1.00)
Chloride: 102 mmol/L (ref 101–111)
GFR calc Af Amer: >60 mL/min (ref >60)
GLUCOSE: 170 mg/dL — AB (ref 65–99)
POTASSIUM: 3.7 mmol/L (ref 3.5–5.1)
Sodium: 137 mmol/L (ref 135–145)
TOTAL PROTEIN: 7 g/dL (ref 6.5–8.1)

## 2016-02-24 LAB — CBC WITH DIFFERENTIAL/PLATELET
BASOS ABS: 0.1 10*3/uL (ref 0–0.1)
BASOS PCT: 1 %
Eosinophils Absolute: 0.1 10*3/uL (ref 0–0.7)
Eosinophils Relative: 1 %
HEMATOCRIT: 42.9 % (ref 35.0–47.0)
HEMOGLOBIN: 14.8 g/dL (ref 12.0–16.0)
LYMPHS PCT: 49 %
Lymphs Abs: 3.4 10*3/uL (ref 1.0–3.6)
MCH: 30.2 pg (ref 26.0–34.0)
MCHC: 34.5 g/dL (ref 32.0–36.0)
MCV: 87.7 fL (ref 80.0–100.0)
MONO ABS: 0.5 10*3/uL (ref 0.2–0.9)
Monocytes Relative: 7 %
NEUTROS ABS: 2.8 10*3/uL (ref 1.4–6.5)
NEUTROS PCT: 42 %
Platelets: 241 10*3/uL (ref 150–440)
RBC: 4.89 MIL/uL (ref 3.80–5.20)
RDW: 13.2 % (ref 11.5–14.5)
WBC: 6.8 10*3/uL (ref 3.6–11.0)

## 2016-02-24 LAB — SEDIMENTATION RATE: Sed Rate: 7 mm/hr (ref 0–30)

## 2016-02-24 LAB — LACTATE DEHYDROGENASE: LDH: 151 U/L (ref 98–192)

## 2016-02-24 NOTE — Assessment & Plan Note (Addendum)
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 Most recent CT scan April 2017 which noted  No evidence of disease.  # slightly elevated LFTs/ fatty liver- monitor for now.   # no imaging follow up in 6 months. Patient not keen on CT scans. Labs.

## 2016-02-24 NOTE — Progress Notes (Signed)
[facility] OFFICE PROGRESS NOTE  Patient Care Team: Marinda Elk, MD as PCP - General (Physician Assistant)  No matching staging information was found for the patient.   Oncology History     1.FEB 2013-  MESENTERIC LYMPHADENOPATHY;] Pet positive lymphadenopathy.; . 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 3. Rituxan weekly x4 finished in March of 2013 4. PET scan in July of 2013 complete remission. 5. Maintenance Rituxan started in July of 2013;  x 2 years [finished 2015]   # LFTs- slightly up/ ? faty liver.   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 I reviewed the patient's prior charts/pertinent labs/imaging in detail; findings are summarized above.     INTERVAL HISTORY:  [name] 75 y.o.  female pleasant patient above history of Follow-up to lymphoma status post Rituxan maintenance finished in 2015 is here for follow-up.  Patient denies any lumps or bumps. Appetite is good. No weight loss. No nausea no vomiting. No night sweats. No fevers or chills.   REVIEW OF SYSTEMS:  A complete 10 point review of system is done which is negative except mentioned above/history of present illness.   PAST MEDICAL HISTORY :  Past Medical History:  Diagnosis Date  . Allergy    seasonal  . Arthritis    degenerative of hip right  . Asthma   . Barrett esophagus   . 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 Migraine   . 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 Non Hodgkin's  lymphoma ([place])   . Skin cancer     PAST SURGICAL HISTORY :   Past Surgical History:  Procedure Laterality Date  . ABDOMINAL HYSTERECTOMY    . APPENDECTOMY    . BASAL CELL CARCINOMA EXCISION     on both sides of nose  . BREAST BIOPSY Left 12/2013   hyperplasia  . CHOLECYSTECTOMY    . ESOPHAGOGASTRODUODENOSCOPY (EGD) WITH PROPOFOL N/A 03/30/2015   Procedure: ESOPHAGOGASTRODUODENOSCOPY (EGD) WITH PROPOFOL;  Surgeon: Lollie Sails, MD;  Location: [facility] ENDOSCOPY;  Service: Endoscopy;  Laterality: N/A;  . FACIAL COSMETIC SURGERY    . history of multiple colonic polyps    . lymph node removal from left groin    . OOPHORECTOMY    . VENTRAL HERNIA REPAIR N/A 10/02/2014   Procedure: HERNIA REPAIR VENTRAL ADULT;  Surgeon: Leonie Green, MD;  Location: [facility] ORS;  Service: General;  Laterality: N/A;  with mesh    FAMILY HISTORY :   Family History  Problem Relation Age of Onset  . Breast cancer Neg Hx     SOCIAL HISTORY:   Social History  Substance Use Topics  . Smoking status: Never Smoker  . Smokeless tobacco: Never Used  . Alcohol use 0.6 oz/week    1 Cans of beer per week     Comment: rare social occasions only    ALLERGIES:  is allergic to no known allergies; buprenorphine hcl; and morphine and related.  MEDICATIONS:  Current Outpatient Prescriptions  Medication Sig Dispense Refill  . albuterol (PROVENTIL HFA;VENTOLIN HFA) 108 (90 BASE) MCG/ACT inhaler Inhale 2 puffs into the lungs every 6 (six) hours as needed for wheezing or shortness of breath.    . loperamide (IMODIUM A-D) 2 MG tablet Take 2 mg by mouth 4 (four) times daily as needed for diarrhea or loose stools.    Marland Kitchen loratadine (CLARITIN) 10 MG tablet Take 10 mg by mouth every morning.     Marland Kitchen omeprazole (PRILOSEC) 20 MG capsule      No current facility-administered medications for this visit.     PHYSICAL EXAMINATION: ECOG PERFORMANCE STATUS: 0 - Asymptomatic  BP 131/83 (BP Location: Left Arm, Patient  Position: Sitting)   Pulse 84   Temp 97.6 F (36.4 C)   Resp 18   Ht 5\' 2"  (1.575 m)   Wt 188 lb 6.4 oz (85.5 kg)   BMI 34.46 kg/m   Filed Weights   02/24/16 1515  Weight: 188 lb 6.4 oz (85.5 kg)    GENERAL: Well-nourished well-developed; Alert, no distress and comfortable.   Alone.  EYES: no pallor or icterus OROPHARYNX: no thrush or ulceration; good dentition  NECK: supple, no masses felt LYMPH:  no palpable lymphadenopathy in the cervical, axillary or inguinal regions LUNGS: clear to auscultation and  No wheeze or crackles HEART/CVS: regular rate & rhythm and no murmurs; No lower extremity edema ABDOMEN:abdomen soft, non-tender and normal bowel sounds Musculoskeletal:no cyanosis of digits and no clubbing  PSYCH: alert & oriented x 3 with fluent speech NEURO: no focal motor/sensory deficits SKIN:  no rashes or significant lesions  LABORATORY DATA:  I have reviewed the data as listed    Component Value Date/Time   NA 137 02/24/2016 1432   NA 141 03/03/2014 1022   K 3.7 02/24/2016 1432   K 4.1 03/03/2014 1022   CL 102 02/24/2016 1432   CL 105 03/03/2014 1022   CO2 25 02/24/2016 1432   CO2 28 03/03/2014 1022   GLUCOSE 170 (H) 02/24/2016 1432   GLUCOSE 112 (H) 03/03/2014 1022   BUN 17 02/24/2016 1432   BUN 18 03/03/2014 1022   CREATININE 0.77 02/24/2016 1432   CREATININE 0.94 03/03/2014 1022   CALCIUM 9.3 02/24/2016 1432   CALCIUM 9.4 03/03/2014 1022   PROT 7.0 02/24/2016 1432   PROT 7.4 03/03/2014 1022   ALBUMIN 4.1 02/24/2016 1432   ALBUMIN 4.1 03/03/2014 1022   AST 70 (H) 02/24/2016 1432   AST 29 03/03/2014 1022   ALT 83 (H) 02/24/2016 1432   ALT 53 03/03/2014 1022   ALKPHOS 70 02/24/2016 1432   ALKPHOS 81 03/03/2014 1022   BILITOT 0.7 02/24/2016 1432   BILITOT 0.4 03/03/2014 1022   GFRNONAA >60 02/24/2016 1432   GFRNONAA >60 03/03/2014 1022   GFRNONAA >60 09/02/2013 0939   GFRAA >60 02/24/2016 1432   GFRAA >60 03/03/2014 1022   GFRAA >60 09/02/2013  0939    No results found for: SPEP, UPEP  Lab Results  Component Value Date   WBC 6.8 02/24/2016   NEUTROABS 2.8 02/24/2016   HGB 14.8 02/24/2016   HCT 42.9 02/24/2016   MCV 87.7 02/24/2016   PLT 241 02/24/2016      Chemistry      Component Value Date/Time   NA 137 02/24/2016 1432   NA 141 03/03/2014 1022   K 3.7 02/24/2016 1432   K 4.1 03/03/2014 1022   CL 102 02/24/2016 1432   CL  105 03/03/2014 1022   CO2 25 02/24/2016 1432   CO2 28 03/03/2014 1022   BUN 17 02/24/2016 1432   BUN 18 03/03/2014 1022   CREATININE 0.77 02/24/2016 1432   CREATININE 0.94 03/03/2014 1022      Component Value Date/Time   CALCIUM 9.3 02/24/2016 1432   CALCIUM 9.4 03/03/2014 1022   ALKPHOS 70 02/24/2016 1432   ALKPHOS 81 03/03/2014 1022   AST 70 (H) 02/24/2016 1432   AST 29 03/03/2014 1022   ALT 83 (H) 02/24/2016 1432   ALT 53 03/03/2014 1022   BILITOT 0.7 02/24/2016 1432   BILITOT 0.4 03/03/2014 1022       RADIOGRAPHIC STUDIES: I have personally reviewed the radiological images as listed and agreed with the findings in the report. No results found.   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 Most recent CT scan April 2017 which noted  No evidence of disease.  # slightly elevated LFTs/ fatty liver- monitor for now.   # no imaging follow up in 6 months. Patient not keen on CT scans. Labs.    Orders Placed This Encounter  Procedures  . CBC with Differential    Standing Status:   Future    Standing Expiration Date:   02/23/2017  . Comprehensive metabolic panel    Standing Status:   Future    Standing Expiration Date:   02/23/2017  . Lactate dehydrogenase    Standing Status:   Future    Standing Expiration Date:   02/23/2017   All questions were answered. The patient knows to call the clinic with any  problems, questions or concerns.      Cammie Sickle, MD 02/25/2016 6:19 PM

## 2016-08-22 ENCOUNTER — Inpatient Hospital Stay (HOSPITAL_BASED_OUTPATIENT_CLINIC_OR_DEPARTMENT_OTHER): Payer: Medicare Other | Admitting: Internal Medicine

## 2016-08-22 ENCOUNTER — Inpatient Hospital Stay: Payer: Medicare Other | Attending: Internal Medicine

## 2016-08-22 VITALS — BP 174/99 | HR 87 | Temp 96.6°F | Resp 18 | Wt 189.5 lb

## 2016-08-22 DIAGNOSIS — Z7982 Long term (current) use of aspirin: Secondary | ICD-10-CM | POA: Diagnosis not present

## 2016-08-22 DIAGNOSIS — R634 Abnormal weight loss: Secondary | ICD-10-CM

## 2016-08-22 DIAGNOSIS — Z85828 Personal history of other malignant neoplasm of skin: Secondary | ICD-10-CM

## 2016-08-22 DIAGNOSIS — J45909 Unspecified asthma, uncomplicated: Secondary | ICD-10-CM

## 2016-08-22 DIAGNOSIS — C8203 Follicular lymphoma grade I, intra-abdominal lymph nodes: Secondary | ICD-10-CM

## 2016-08-22 DIAGNOSIS — B001 Herpesviral vesicular dermatitis: Secondary | ICD-10-CM | POA: Insufficient documentation

## 2016-08-22 DIAGNOSIS — K2270 Barrett's esophagus without dysplasia: Secondary | ICD-10-CM | POA: Diagnosis not present

## 2016-08-22 DIAGNOSIS — K760 Fatty (change of) liver, not elsewhere classified: Secondary | ICD-10-CM

## 2016-08-22 DIAGNOSIS — Z79899 Other long term (current) drug therapy: Secondary | ICD-10-CM | POA: Insufficient documentation

## 2016-08-22 DIAGNOSIS — Z8669 Personal history of other diseases of the nervous system and sense organs: Secondary | ICD-10-CM | POA: Diagnosis not present

## 2016-08-22 DIAGNOSIS — M129 Arthropathy, unspecified: Secondary | ICD-10-CM | POA: Insufficient documentation

## 2016-08-22 DIAGNOSIS — R030 Elevated blood-pressure reading, without diagnosis of hypertension: Secondary | ICD-10-CM

## 2016-08-22 LAB — COMPREHENSIVE METABOLIC PANEL
ALK PHOS: 84 U/L (ref 38–126)
ALT: 59 U/L — AB (ref 14–54)
ANION GAP: 8 (ref 5–15)
AST: 47 U/L — ABNORMAL HIGH (ref 15–41)
Albumin: 4.2 g/dL (ref 3.5–5.0)
BUN: 18 mg/dL (ref 6–20)
CHLORIDE: 103 mmol/L (ref 101–111)
CO2: 27 mmol/L (ref 22–32)
Calcium: 9.4 mg/dL (ref 8.9–10.3)
Creatinine, Ser: 0.76 mg/dL (ref 0.44–1.00)
GFR calc Af Amer: >60 mL/min (ref >60)
GFR calc non Af Amer: >60 mL/min (ref >60)
GLUCOSE: 108 mg/dL — AB (ref 65–99)
POTASSIUM: 3.9 mmol/L (ref 3.5–5.1)
SODIUM: 138 mmol/L (ref 135–145)
Total Bilirubin: 0.8 mg/dL (ref 0.3–1.2)
Total Protein: 7.2 g/dL (ref 6.5–8.1)

## 2016-08-22 LAB — CBC WITH DIFFERENTIAL/PLATELET
Basophils Absolute: 0.1 10*3/uL (ref 0–0.1)
Basophils Relative: 1 %
EOS ABS: 0.1 10*3/uL (ref 0–0.7)
EOS PCT: 1 %
HCT: 42.8 % (ref 35.0–47.0)
HEMOGLOBIN: 14.6 g/dL (ref 12.0–16.0)
LYMPHS ABS: 3.5 10*3/uL (ref 1.0–3.6)
LYMPHS PCT: 50 %
MCH: 30.1 pg (ref 26.0–34.0)
MCHC: 34.3 g/dL (ref 32.0–36.0)
MCV: 87.8 fL (ref 80.0–100.0)
MONOS PCT: 10 %
Monocytes Absolute: 0.7 10*3/uL (ref 0.2–0.9)
Neutro Abs: 2.7 10*3/uL (ref 1.4–6.5)
Neutrophils Relative %: 38 %
PLATELETS: 278 10*3/uL (ref 150–440)
RBC: 4.87 MIL/uL (ref 3.80–5.20)
RDW: 13.7 % (ref 11.5–14.5)
WBC: 7 10*3/uL (ref 3.6–11.0)

## 2016-08-22 LAB — LACTATE DEHYDROGENASE: LDH: 146 U/L (ref 98–192)

## 2016-08-22 NOTE — Progress Notes (Signed)
[place] OFFICE PROGRESS NOTE  Patient Care Team: Marinda Elk, MD as PCP - General (Physician Assistant)  Cancer Staging No matching staging information was found for the patient.   Oncology History     1.FEB 2013-  MESENTERIC LYMPHADENOPATHY;] Pet positive lymphadenopathy.; . 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 3. Rituxan weekly x4 finished in March of 2013 4. PET scan in July of 2013 complete remission. 5. Maintenance Rituxan started in July of 2013;  x 2 years [finished 2015]   # LFTs- slightly up/ ? faty liver.   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  female pleasant patient above history of Follow-up to lymphoma status post Rituxan maintenance finished in 2015 is here for follow-up.   patient had a  Recent basal cell carcinoma of the skin excised on the face.  Otherwise patient denies any lumps or bumps. Appetite is good. No weight loss;  gaining weight.. No nausea no vomiting. No night sweats. No fevers or chills.   REVIEW OF SYSTEMS:  A complete 10 point review of system is done which is negative except mentioned above/history of present illness.   PAST MEDICAL HISTORY :  Past Medical History:  Diagnosis Date  . Allergy    seasonal  . Arthritis    degenerative of hip right  . Asthma   . Barrett esophagus   . 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 Migraine   . 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 Non Hodgkin's lymphoma ([place])   . Skin cancer     PAST SURGICAL HISTORY :   Past Surgical History:   Procedure Laterality Date  . ABDOMINAL HYSTERECTOMY    . APPENDECTOMY    . BASAL CELL CARCINOMA EXCISION     on both sides of nose  . BREAST BIOPSY Left 12/2013   hyperplasia  . CHOLECYSTECTOMY    . ESOPHAGOGASTRODUODENOSCOPY (EGD) WITH PROPOFOL N/A 03/30/2015   Procedure: ESOPHAGOGASTRODUODENOSCOPY (EGD) WITH PROPOFOL;  Surgeon: Lollie Sails, MD;  Location: [facility] ENDOSCOPY;  Service: Endoscopy;  Laterality: N/A;  . FACIAL COSMETIC SURGERY    . history of multiple colonic polyps    . lymph node removal from left groin    . OOPHORECTOMY    . VENTRAL HERNIA REPAIR N/A 10/02/2014   Procedure: HERNIA REPAIR VENTRAL ADULT;  Surgeon: Leonie Green, MD;  Location: [facility] ORS;  Service: General;  Laterality: N/A;  with mesh    FAMILY HISTORY :   Family History  Problem Relation Age of Onset  . Breast cancer Neg Hx     SOCIAL HISTORY:   Social History  Substance Use Topics  . Smoking status: Never Smoker  . Smokeless tobacco: Never Used  . Alcohol use 0.6 oz/week    1 Cans of beer per week     Comment: rare social occasions only    ALLERGIES:  is allergic to no known allergies; buprenorphine hcl; and morphine and related.  MEDICATIONS:  Current Outpatient Prescriptions  Medication Sig Dispense  Refill  . albuterol (PROVENTIL HFA;VENTOLIN HFA) 108 (90 BASE) MCG/ACT inhaler Inhale 2 puffs into the lungs every 6 (six) hours as needed for wheezing or shortness of breath.    . fluticasone (FLONASE) 50 MCG/ACT nasal spray 2 sprays by Each Nare route daily.    Marland Kitchen loratadine (CLARITIN) 10 MG tablet Take 10 mg by mouth every morning.     Marland Kitchen omeprazole (PRILOSEC) 20 MG capsule     . aspirin EC 81 MG tablet Take 81 mg by mouth.    . loperamide (IMODIUM A-D) 2 MG tablet Take 2 mg by mouth 4 (four) times daily as needed for diarrhea or loose stools.     No current facility-administered medications for this visit.     PHYSICAL EXAMINATION: ECOG PERFORMANCE STATUS: 0 -  Asymptomatic  BP (!) 174/99 (BP Location: Right Arm, Patient Position: Sitting)   Pulse 87   Temp (!) 96.6 F (35.9 C) (Tympanic)   Resp 18   Wt 189 lb 7.8 oz (86 kg)   BMI 34.66 kg/m   Filed Weights   08/22/16 1131  Weight: 189 lb 7.8 oz (86 kg)    GENERAL: Well-nourished well-developed; Alert, no distress and comfortable.   Alone.  EYES: no pallor or icterus OROPHARYNX: no thrush or ulceration; good dentition  NECK: supple, no masses felt LYMPH:  no palpable lymphadenopathy in the cervical, axillary or inguinal regions LUNGS: clear to auscultation and  No wheeze or crackles HEART/CVS: regular rate & rhythm and no murmurs; No lower extremity edema ABDOMEN:abdomen soft, non-tender and normal bowel sounds Musculoskeletal:no cyanosis of digits and no clubbing  PSYCH: alert & oriented x 3 with fluent speech NEURO: no focal motor/sensory deficits SKIN:  no rashes or significant lesions  LABORATORY DATA:  I have reviewed the data as listed    Component Value Date/Time   NA 138 08/22/2016 1116   NA 141 03/03/2014 1022   K 3.9 08/22/2016 1116   K 4.1 03/03/2014 1022   CL 103 08/22/2016 1116   CL 105 03/03/2014 1022   CO2 27 08/22/2016 1116   CO2 28 03/03/2014 1022   GLUCOSE 108 (H) 08/22/2016 1116   GLUCOSE 112 (H) 03/03/2014 1022   BUN 18 08/22/2016 1116   BUN 18 03/03/2014 1022   CREATININE 0.76 08/22/2016 1116   CREATININE 0.94 03/03/2014 1022   CALCIUM 9.4 08/22/2016 1116   CALCIUM 9.4 03/03/2014 1022   PROT 7.2 08/22/2016 1116   PROT 7.4 03/03/2014 1022   ALBUMIN 4.2 08/22/2016 1116   ALBUMIN 4.1 03/03/2014 1022   AST 47 (H) 08/22/2016 1116   AST 29 03/03/2014 1022   ALT 59 (H) 08/22/2016 1116   ALT 53 03/03/2014 1022   ALKPHOS 84 08/22/2016 1116   ALKPHOS 81 03/03/2014 1022   BILITOT 0.8 08/22/2016 1116   BILITOT 0.4 03/03/2014 1022   GFRNONAA >60 08/22/2016 1116   GFRNONAA >60 03/03/2014 1022   GFRNONAA >60 09/02/2013 0939   GFRAA >60 08/22/2016 1116    GFRAA >60 03/03/2014 1022   GFRAA >60 09/02/2013 0939    No results found for: SPEP, UPEP  Lab Results  Component Value Date   WBC 7.0 08/22/2016   NEUTROABS 2.7 08/22/2016   HGB 14.6 08/22/2016   HCT 42.8 08/22/2016   MCV 87.8 08/22/2016   PLT 278 08/22/2016      Chemistry      Component Value Date/Time   NA 138 08/22/2016 1116   NA 141 03/03/2014 1022   K  3.9 08/22/2016 1116   K 4.1 03/03/2014 1022   CL 103 08/22/2016 1116   CL 105 03/03/2014 1022   CO2 27 08/22/2016 1116   CO2 28 03/03/2014 1022   BUN 18 08/22/2016 1116   BUN 18 03/03/2014 1022   CREATININE 0.76 08/22/2016 1116   CREATININE 0.94 03/03/2014 1022      Component Value Date/Time   CALCIUM 9.4 08/22/2016 1116   CALCIUM 9.4 03/03/2014 1022   ALKPHOS 84 08/22/2016 1116   ALKPHOS 81 03/03/2014 1022   AST 47 (H) 08/22/2016 1116   AST 29 03/03/2014 1022   ALT 59 (H) 08/22/2016 1116   ALT 53 03/03/2014 1022   BILITOT 0.8 08/22/2016 1116   BILITOT 0.4 03/03/2014 1022       RADIOGRAPHIC STUDIES: I have personally reviewed the radiological images as listed and agreed with the findings in the report. No results found.   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 Most recent CT scan April 2017 which noted  No evidence of disease.  # slightly elevated LFTs/ fatty liver-discussed re: weight loss/ eating healthy/exercise.   # Elevated Blood pressure- 170s/ 90s- recommend check blood pressure at home/ make a log; and inform pcp.   # no imaging follow up in 6 months [reluctant on scans].    Orders Placed This Encounter  Procedures  . CBC with Differential    Standing Status:   Future    Standing Expiration Date:   08/22/2017  . Comprehensive metabolic panel    Standing Status:   Future    Standing Expiration Date:   08/22/2017    All questions were answered. The patient knows to call the clinic with any problems, questions or concerns.      Cammie Sickle, MD 08/22/2016 12:19 PM

## 2016-08-22 NOTE — Progress Notes (Signed)
here for follow up. In feb/18 had basal cell cancer on L eye lid-removed and doing well per pt

## 2016-08-22 NOTE — Assessment & Plan Note (Addendum)
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 Most recent CT scan April 2017 which noted  No evidence of disease.  # slightly elevated LFTs/ fatty liver-discussed re: weight loss/ eating healthy/exercise.   # Elevated Blood pressure- 170s/ 90s- recommend check blood pressure at home/ make a log; and inform pcp.   # no imaging follow up in 6 months [reluctant on scans].

## 2016-08-24 ENCOUNTER — Other Ambulatory Visit: Payer: Medicare Other

## 2016-08-24 ENCOUNTER — Ambulatory Visit: Payer: Medicare Other | Admitting: Internal Medicine

## 2016-11-16 ENCOUNTER — Other Ambulatory Visit: Payer: Self-pay | Admitting: Physician Assistant

## 2016-11-16 DIAGNOSIS — Z1231 Encounter for screening mammogram for malignant neoplasm of breast: Secondary | ICD-10-CM

## 2017-01-02 ENCOUNTER — Other Ambulatory Visit: Payer: Self-pay | Admitting: Gastroenterology

## 2017-01-02 DIAGNOSIS — R945 Abnormal results of liver function studies: Principal | ICD-10-CM

## 2017-01-02 DIAGNOSIS — R7989 Other specified abnormal findings of blood chemistry: Secondary | ICD-10-CM

## 2017-01-02 DIAGNOSIS — R160 Hepatomegaly, not elsewhere classified: Secondary | ICD-10-CM

## 2017-01-15 ENCOUNTER — Ambulatory Visit
Admission: RE | Admit: 2017-01-15 | Discharge: 2017-01-15 | Disposition: A | Discharge status: Home / Self Care | Payer: Medicare Other | Source: Ambulatory Visit | Attending: Physician Assistant | Admitting: Physician Assistant

## 2017-01-15 DIAGNOSIS — Z1231 Encounter for screening mammogram for malignant neoplasm of breast: Secondary | ICD-10-CM | POA: Insufficient documentation

## 2017-01-17 ENCOUNTER — Ambulatory Visit
Admission: RE | Admit: 2017-01-17 | Discharge: 2017-01-17 | Disposition: A | Discharge status: Home / Self Care | Payer: Medicare Other | Source: Ambulatory Visit | Attending: Gastroenterology | Admitting: Gastroenterology

## 2017-01-17 DIAGNOSIS — K760 Fatty (change of) liver, not elsewhere classified: Secondary | ICD-10-CM | POA: Diagnosis not present

## 2017-01-17 DIAGNOSIS — R945 Abnormal results of liver function studies: Secondary | ICD-10-CM

## 2017-01-17 DIAGNOSIS — R7989 Other specified abnormal findings of blood chemistry: Secondary | ICD-10-CM | POA: Diagnosis present

## 2017-01-17 DIAGNOSIS — I700 Atherosclerosis of aorta: Secondary | ICD-10-CM | POA: Insufficient documentation

## 2017-01-17 DIAGNOSIS — R160 Hepatomegaly, not elsewhere classified: Secondary | ICD-10-CM

## 2017-01-17 MED ORDER — IOPAMIDOL (ISOVUE-370) INJECTION 76%
100.0000 mL | Freq: Once | INTRAVENOUS | Status: AC | PRN
  Administered 2017-01-17: 100 mL via INTRAVENOUS

## 2017-02-20 ENCOUNTER — Inpatient Hospital Stay: Payer: Medicare Other | Attending: Internal Medicine | Admitting: Internal Medicine

## 2017-02-20 ENCOUNTER — Inpatient Hospital Stay: Payer: Medicare Other

## 2017-02-20 ENCOUNTER — Other Ambulatory Visit: Payer: Self-pay

## 2017-02-20 VITALS — BP 142/95 | HR 76 | Temp 97.9°F | Resp 14 | Wt 187.8 lb

## 2017-02-20 DIAGNOSIS — Z7982 Long term (current) use of aspirin: Secondary | ICD-10-CM | POA: Insufficient documentation

## 2017-02-20 DIAGNOSIS — Z8572 Personal history of non-Hodgkin lymphomas: Secondary | ICD-10-CM | POA: Diagnosis not present

## 2017-02-20 DIAGNOSIS — Z79899 Other long term (current) drug therapy: Secondary | ICD-10-CM | POA: Diagnosis not present

## 2017-02-20 DIAGNOSIS — K2270 Barrett's esophagus without dysplasia: Secondary | ICD-10-CM

## 2017-02-20 DIAGNOSIS — C8203 Follicular lymphoma grade I, intra-abdominal lymph nodes: Secondary | ICD-10-CM

## 2017-02-20 DIAGNOSIS — Z85828 Personal history of other malignant neoplasm of skin: Secondary | ICD-10-CM

## 2017-02-20 DIAGNOSIS — M129 Arthropathy, unspecified: Secondary | ICD-10-CM | POA: Insufficient documentation

## 2017-02-20 DIAGNOSIS — J45909 Unspecified asthma, uncomplicated: Secondary | ICD-10-CM | POA: Diagnosis not present

## 2017-02-20 DIAGNOSIS — Z86010 Personal history of colonic polyps: Secondary | ICD-10-CM | POA: Diagnosis not present

## 2017-02-20 DIAGNOSIS — R948 Abnormal results of function studies of other organs and systems: Secondary | ICD-10-CM

## 2017-02-20 DIAGNOSIS — R030 Elevated blood-pressure reading, without diagnosis of hypertension: Secondary | ICD-10-CM | POA: Insufficient documentation

## 2017-02-20 DIAGNOSIS — B0089 Other herpesviral infection: Secondary | ICD-10-CM

## 2017-02-20 DIAGNOSIS — I700 Atherosclerosis of aorta: Secondary | ICD-10-CM | POA: Diagnosis not present

## 2017-02-20 DIAGNOSIS — K760 Fatty (change of) liver, not elsewhere classified: Secondary | ICD-10-CM | POA: Diagnosis not present

## 2017-02-20 LAB — CBC WITH DIFFERENTIAL/PLATELET
BASOS ABS: 0.2 10*3/uL — AB (ref 0–0.1)
BASOS PCT: 2 %
EOS ABS: 0.1 10*3/uL (ref 0–0.7)
EOS PCT: 1 %
HCT: 41.1 % (ref 35.0–47.0)
Hemoglobin: 14.1 g/dL (ref 12.0–16.0)
Lymphocytes Relative: 36 %
Lymphs Abs: 2.8 10*3/uL (ref 1.0–3.6)
MCH: 30.6 pg (ref 26.0–34.0)
MCHC: 34.3 g/dL (ref 32.0–36.0)
MCV: 89.1 fL (ref 80.0–100.0)
MONO ABS: 0.5 10*3/uL (ref 0.2–0.9)
Monocytes Relative: 6 %
Neutro Abs: 4.3 10*3/uL (ref 1.4–6.5)
Neutrophils Relative %: 55 %
PLATELETS: 261 10*3/uL (ref 150–440)
RBC: 4.61 MIL/uL (ref 3.80–5.20)
RDW: 13.5 % (ref 11.5–14.5)
WBC: 7.8 10*3/uL (ref 3.6–11.0)

## 2017-02-20 LAB — COMPREHENSIVE METABOLIC PANEL
ALT: 49 U/L (ref 14–54)
AST: 41 U/L (ref 15–41)
Albumin: 4.4 g/dL (ref 3.5–5.0)
Alkaline Phosphatase: 68 U/L (ref 38–126)
Anion gap: 10 (ref 5–15)
BUN: 13 mg/dL (ref 6–20)
CHLORIDE: 104 mmol/L (ref 101–111)
CO2: 26 mmol/L (ref 22–32)
CREATININE: 0.85 mg/dL (ref 0.44–1.00)
Calcium: 9.3 mg/dL (ref 8.9–10.3)
GFR calc non Af Amer: >60 mL/min (ref >60)
Glucose, Bld: 112 mg/dL — ABNORMAL HIGH (ref 65–99)
POTASSIUM: 4.3 mmol/L (ref 3.5–5.1)
SODIUM: 140 mmol/L (ref 135–145)
Total Bilirubin: 0.6 mg/dL (ref 0.3–1.2)
Total Protein: 7.4 g/dL (ref 6.5–8.1)

## 2017-02-20 NOTE — Progress Notes (Signed)
[place] OFFICE PROGRESS NOTE  Patient Care Team: Marinda Elk, MD as PCP - General (Physician Assistant)  Cancer Staging No matching staging information was found for the patient.   Oncology History     1.FEB 2013-  MESENTERIC LYMPHADENOPATHY;] Pet positive lymphadenopathy.; . 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 3. Rituxan weekly x4 finished in March of 2013 4. PET scan in July of 2013 complete remission. 5. Maintenance Rituxan started in July of 2013;  x 2 years [finished 2015]   # LFTs- slightly up/ ? faty liver.   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  female pleasant patient above history lymphoma status post rituxan maintenance finished 2015 returns today for follow-up.   Patient denies any new lumps or bumps. No abnormal weight loss or gain. No nausea, vomiting. Denies fever, chills, and/or night sweats. She has reduced her alcohol intake and is attempting to reduce her intake of fatty foods. Today she is complaining of chronic seasonal allergies for which she takes Claritin and Flonase. She finished a dose of amoxicillin last week.   She is still followed by dermatology for basal cell carcinoma on her face but no new concerns.   REVIEW OF SYSTEMS:  A complete 10 point review of system is done which is negative except mentioned above/history of present illness.   PAST MEDICAL HISTORY :  Past Medical History:  Diagnosis Date  . Allergy    seasonal  . Arthritis    degenerative of hip right  . Asthma   . Barrett esophagus   . HSV (herpes simplex virus)  infection    of upper lip  . Migraine   . 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 ([place]) 09/13/2014  . Non Hodgkin's lymphoma ([place])   . Skin cancer     PAST SURGICAL HISTORY :   Past Surgical History:  Procedure Laterality Date  . ABDOMINAL HYSTERECTOMY    . APPENDECTOMY    . BASAL CELL CARCINOMA EXCISION     on both sides of nose  . BREAST BIOPSY Left 12/2013   hyperplasia  . CHOLECYSTECTOMY    . ESOPHAGOGASTRODUODENOSCOPY (EGD) WITH PROPOFOL N/A 03/30/2015   Procedure: ESOPHAGOGASTRODUODENOSCOPY (EGD) WITH PROPOFOL;  Surgeon: Lollie Sails, MD;  Location: [facility] ENDOSCOPY;  Service: Endoscopy;  Laterality: N/A;  . FACIAL COSMETIC SURGERY    . history of multiple colonic polyps    . lymph node removal from left groin    . OOPHORECTOMY    . VENTRAL HERNIA REPAIR N/A 10/02/2014   Procedure: HERNIA REPAIR VENTRAL ADULT;  Surgeon: Leonie Green, MD;  Location: [facility] ORS;  Service: General;  Laterality: N/A;  with mesh    FAMILY HISTORY :   Family History  Problem Relation Age of Onset  . Breast cancer Neg Hx     SOCIAL HISTORY:   Social History  Substance Use Topics  . Smoking status: Never Smoker  . Smokeless tobacco: Never Used  . Alcohol use 0.6 oz/week    1 Cans of beer per week  Comment: rare social occasions only    ALLERGIES:  is allergic to no known allergies; buprenorphine hcl; and morphine and related.  MEDICATIONS:  Current Outpatient Prescriptions  Medication Sig Dispense Refill  . albuterol (PROVENTIL HFA;VENTOLIN HFA) 108 (90 BASE) MCG/ACT inhaler Inhale 2 puffs into the lungs every 6 (six) hours as needed for wheezing or shortness of breath.    Marland Kitchen aspirin EC 81 MG tablet Take 81 mg by mouth.    . fluticasone (FLONASE) 50 MCG/ACT nasal spray 2 sprays by Each Nare route daily.    Marland Kitchen lisinopril (PRINIVIL,ZESTRIL) 10 MG tablet     . loperamide (IMODIUM A-D) 2 MG tablet Take 2 mg by mouth 4 (four) times daily as needed for diarrhea or  loose stools.    Marland Kitchen loratadine (CLARITIN) 10 MG tablet Take 10 mg by mouth every morning.     Marland Kitchen omeprazole (PRILOSEC) 20 MG capsule      No current facility-administered medications for this visit.     PHYSICAL EXAMINATION: ECOG PERFORMANCE STATUS: 0 - Asymptomatic  BP (!) 142/95 (BP Location: Left Arm, Patient Position: Sitting)   Pulse 76   Temp 97.9 F (36.6 C) (Tympanic)   Resp 14   Wt 187 lb 12.6 oz (85.2 kg)   BMI 34.35 kg/m   Filed Weights   02/20/17 1129  Weight: 187 lb 12.6 oz (85.2 kg)    GENERAL: Well-nourished well-developed; Alert, no distress and comfortable. Alone.  EYES: no pallor or icterus OROPHARYNX: no thrush or ulceration; good dentition  NECK: supple, no masses felt LYMPH:  no palpable lymphadenopathy in the cervical, axillary or inguinal regions LUNGS: clear to auscultation and  No wheeze or crackles HEART/CVS: regular rate & rhythm and no murmurs; No lower extremity edema ABDOMEN:abdomen soft, non-tender and normal bowel sounds Musculoskeletal:no cyanosis of digits and no clubbing  PSYCH: alert & oriented x 3 with fluent speech NEURO: no focal motor/sensory deficits SKIN:  no rashes or significant lesions  LABORATORY DATA:  I have reviewed the data as listed    Component Value Date/Time   NA 140 02/20/2017 1110   NA 141 03/03/2014 1022   K 4.3 02/20/2017 1110   K 4.1 03/03/2014 1022   CL 104 02/20/2017 1110   CL 105 03/03/2014 1022   CO2 26 02/20/2017 1110   CO2 28 03/03/2014 1022   GLUCOSE 112 (H) 02/20/2017 1110   GLUCOSE 112 (H) 03/03/2014 1022   BUN 13 02/20/2017 1110   BUN 18 03/03/2014 1022   CREATININE 0.85 02/20/2017 1110   CREATININE 0.94 03/03/2014 1022   CALCIUM 9.3 02/20/2017 1110   CALCIUM 9.4 03/03/2014 1022   PROT 7.4 02/20/2017 1110   PROT 7.4 03/03/2014 1022   ALBUMIN 4.4 02/20/2017 1110   ALBUMIN 4.1 03/03/2014 1022   AST 41 02/20/2017 1110   AST 29 03/03/2014 1022   ALT 49 02/20/2017 1110   ALT 53 03/03/2014  1022   ALKPHOS 68 02/20/2017 1110   ALKPHOS 81 03/03/2014 1022   BILITOT 0.6 02/20/2017 1110   BILITOT 0.4 03/03/2014 1022   GFRNONAA >60 02/20/2017 1110   GFRNONAA >60 03/03/2014 1022   GFRNONAA >60 09/02/2013 0939   GFRAA >60 02/20/2017 1110   GFRAA >60 03/03/2014 1022   GFRAA >60 09/02/2013 0939    No results found for: SPEP, UPEP  Lab Results  Component Value Date   WBC 7.8 02/20/2017   NEUTROABS 4.3 02/20/2017   HGB 14.1 02/20/2017   HCT 41.1 02/20/2017  MCV 89.1 02/20/2017   PLT 261 02/20/2017      Chemistry      Component Value Date/Time   NA 140 02/20/2017 1110   NA 141 03/03/2014 1022   K 4.3 02/20/2017 1110   K 4.1 03/03/2014 1022   CL 104 02/20/2017 1110   CL 105 03/03/2014 1022   CO2 26 02/20/2017 1110   CO2 28 03/03/2014 1022   BUN 13 02/20/2017 1110   BUN 18 03/03/2014 1022   CREATININE 0.85 02/20/2017 1110   CREATININE 0.94 03/03/2014 1022      Component Value Date/Time   CALCIUM 9.3 02/20/2017 1110   CALCIUM 9.4 03/03/2014 1022   ALKPHOS 68 02/20/2017 1110   ALKPHOS 81 03/03/2014 1022   AST 41 02/20/2017 1110   AST 29 03/03/2014 1022   ALT 49 02/20/2017 1110   ALT 53 03/03/2014 1022   BILITOT 0.6 02/20/2017 1110   BILITOT 0.4 03/03/2014 1022      RADIOGRAPHIC STUDIES: I have personally reviewed the radiological images as listed and agreed with the findings in the report.  CT Abdomen Pelvis w wo contrast - 01/17/17 IMPRESSION: 1. Hepatic steatosis. No other explanation for elevated liver function tests. 2. No evidence of recurrent or residual lymphoma. 3. Jejunal mesenteric findings which could represent treated lymphoma or mesenteric adenitis/panniculitis. These are chronic. 4.  Aortic Atherosclerosis (ICD10-I70.0).   Electronically Signed   By: Abigail Miyamoto M.D.   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 Most recent CT scan 01/17/17 which showed NED. No clinical evidence of recurrence. Can re-check her in 6 months and consider repeat imaging annually.   # Hepatic Steatosis - patient had previously had elevated LFTs with evidence of fatty liver on 01/17/17. LFTs today normal. Patient has attempted to eat healthier foods and has reduced alcohol intake to 2 beers per day. Ok to monitor. Encouraged weight loss/continued healthy eating and reduction of alcohol intake.   # Elevated Blood pressure- Moderately elevated today 142/95- patient says she was rushing to make appointment. Discussed blood pressure log and appreciate management of hypertension by PCP.    # rtc 6 mo with lab and follow-up with MD   Orders Placed This Encounter  Procedures  . CBC with Differential    Standing Status:   Future    Standing Expiration Date:   02/20/2018  . Comprehensive metabolic panel    Standing Status:   Future    Standing Expiration Date:   02/20/2018   All questions were answered. The patient knows to call the clinic with any problems, questions or concerns.   Beckey Rutter, DNP AGNP-C 02/20/17 12:27 PM    Verlon Au, NP 02/20/2017 12:27 PM

## 2017-02-20 NOTE — Assessment & Plan Note (Addendum)
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 Most recent CT scan 01/17/17 which showed NED. No clinical evidence of recurrence. Can re-check her in 6 months and consider repeat imaging annually.   # Hepatic Steatosis - patient had previously had elevated LFTs with evidence of fatty liver on 01/17/17. LFTs today normal. Patient has attempted to eat healthier foods and has reduced alcohol intake to 2 beers per day. Ok to monitor. Encouraged weight loss/continued healthy eating and reduction of alcohol intake.   # Elevated Blood pressure- Moderately elevated today 142/95- patient says she was rushing to make appointment. Discussed blood pressure log and appreciate management of hypertension by PCP.    # rtc 6 mo with lab and follow-up with MD

## 2017-03-21 ENCOUNTER — Ambulatory Visit
Admission: RE | Admit: 2017-03-21 | Discharge: 2017-03-21 | Disposition: A | Discharge status: Home / Self Care | Payer: Medicare Other | Source: Ambulatory Visit | Attending: Physician Assistant | Admitting: Physician Assistant

## 2017-03-21 ENCOUNTER — Other Ambulatory Visit: Payer: Self-pay | Admitting: Physician Assistant

## 2017-03-21 DIAGNOSIS — R0602 Shortness of breath: Secondary | ICD-10-CM | POA: Diagnosis present

## 2017-03-21 DIAGNOSIS — J9811 Atelectasis: Secondary | ICD-10-CM | POA: Insufficient documentation

## 2017-03-21 DIAGNOSIS — J4520 Mild intermittent asthma, uncomplicated: Secondary | ICD-10-CM | POA: Diagnosis present

## 2017-03-21 DIAGNOSIS — K769 Liver disease, unspecified: Secondary | ICD-10-CM | POA: Insufficient documentation

## 2017-03-21 MED ORDER — IOPAMIDOL (ISOVUE-370) INJECTION 76%
75.0000 mL | Freq: Once | INTRAVENOUS | Status: AC | PRN
  Administered 2017-03-21: 75 mL via INTRAVENOUS

## 2017-04-12 ENCOUNTER — Ambulatory Visit (INDEPENDENT_AMBULATORY_CARE_PROVIDER_SITE_OTHER): Payer: Medicare Other | Admitting: Urology

## 2017-04-12 ENCOUNTER — Encounter: Payer: Self-pay | Admitting: Urology

## 2017-04-12 VITALS — BP 153/74 | HR 88 | Ht 62.0 in | Wt 184.0 lb

## 2017-04-12 DIAGNOSIS — R3129 Other microscopic hematuria: Secondary | ICD-10-CM

## 2017-04-12 DIAGNOSIS — R350 Frequency of micturition: Secondary | ICD-10-CM

## 2017-04-12 LAB — URINALYSIS, COMPLETE
Bilirubin, UA: NEGATIVE
GLUCOSE, UA: NEGATIVE
KETONES UA: NEGATIVE
Nitrite, UA: NEGATIVE
Protein, UA: NEGATIVE
RBC, UA: NEGATIVE
SPEC GRAV UA: 1.01 (ref 1.005–1.030)
Urobilinogen, Ur: 0.2 mg/dL (ref 0.2–1.0)
pH, UA: 5.5 (ref 5.0–7.5)

## 2017-04-12 LAB — MICROSCOPIC EXAMINATION: RBC MICROSCOPIC, UA: NONE SEEN /HPF (ref 0–?)

## 2017-04-12 LAB — BLADDER SCAN AMB NON-IMAGING

## 2017-04-12 NOTE — Progress Notes (Signed)
04/12/2017 10:43 AM   [name] 10/15/1940 [id number]  Referring provider: Marinda Elk, MD [facility] [place], [place] [address]  Chief Complaint  Patient presents with  . Hematuria    New Patient    HPI: [name] is a 76 y.o. female referred for evaluation of microhematuria.  She states after a flu shot given in October she developed a cough and chest congestion along with dysuria.  She was treated with several antibiotic rounds and prednisone for her pulmonary symptoms.  She had a urinalysis on 03/20/2017 which showed 10-50 RBCs.  A urine culture grew mixed flora.  She had a CT of the abdomen and pelvis with and without contrast performed in September 2018 which showed no genitourinary abnormalities.  She has been taking cranberry pills and drinking cranberry juice and rates her symptoms as mild to moderate.  She denies urgency or urge incontinence.  She does have urinary frequency.  She denies previous history of urologic problems evaluation.  No gross hematuria.   PMH: Past Medical History:  Diagnosis Date  . Allergy    seasonal  . Arthritis    degenerative of hip right  . Asthma   . Barrett esophagus   . 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 Migraine   . 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 Non Hodgkin's lymphoma ([place])   . Skin cancer     Surgical History: Past Surgical History:  Procedure Laterality Date  . ABDOMINAL HYSTERECTOMY    . APPENDECTOMY    . BASAL CELL CARCINOMA EXCISION     on both sides of nose  . BREAST BIOPSY Left 12/2013   hyperplasia  . CHOLECYSTECTOMY    . ESOPHAGOGASTRODUODENOSCOPY (EGD) WITH PROPOFOL N/A 03/30/2015   Procedure: ESOPHAGOGASTRODUODENOSCOPY (EGD) WITH PROPOFOL;  Surgeon: Lollie Sails, MD;  Location: [facility] ENDOSCOPY;  Service: Endoscopy;  Laterality: N/A;  . FACIAL COSMETIC SURGERY    . history of multiple colonic polyps    .  lymph node removal from left groin    . OOPHORECTOMY    . VENTRAL HERNIA REPAIR N/A 10/02/2014   Procedure: HERNIA REPAIR VENTRAL ADULT;  Surgeon: Leonie Green, MD;  Location: [facility] ORS;  Service: General;  Laterality: N/A;  with mesh    Home Medications:  Allergies as of 04/12/2017      Reactions   No Known Allergies    Buprenorphine Hcl Nausea And Vomiting   Morphine And Related Nausea And Vomiting      Medication List        Accurate as of 04/12/17 10:43 AM. Always use your most recent med list.          albuterol 108 (90 Base) MCG/ACT inhaler Commonly known as:  PROVENTIL HFA;VENTOLIN HFA Inhale 2 puffs into the lungs every 6 (six) hours as needed for wheezing or shortness of breath.   aspirin EC 81 MG tablet Take 81 mg by mouth.   BREO ELLIPTA 100-25 MCG/INH Aepb Generic drug:  fluticasone furoate-vilanterol Inhale into the lungs.   fluticasone 50 MCG/ACT nasal spray Commonly known as:  FLONASE 2 sprays by Each Nare route daily.   lisinopril 10 MG tablet Commonly known as:  PRINIVIL,ZESTRIL   loperamide 2 MG tablet Commonly known as:  IMODIUM A-D Take 2 mg by mouth 4 (four) times daily as needed for diarrhea or loose stools.   loratadine 10 MG tablet Commonly  known as:  CLARITIN Take 10 mg by mouth every morning.   omeprazole 20 MG capsule Commonly known as:  PRILOSEC       Allergies:  Allergies  Allergen Reactions  . No Known Allergies   . Buprenorphine Hcl Nausea And Vomiting  . Morphine And Related Nausea And Vomiting    Family History: Family History  Problem Relation Age of Onset  . Breast cancer Neg Hx   . Bladder Cancer Neg Hx   . Prostate cancer Neg Hx   . Kidney cancer Neg Hx     Social History:  reports that  has never smoked. she has never used smokeless tobacco. She reports that she drinks about 0.6 oz of alcohol per week. She reports that she does not use drugs.  ROS: UROLOGY Frequent Urination?: Yes Hard to postpone  urination?: No Burning/pain with urination?: Yes Get up at night to urinate?: Yes Leakage of urine?: No Urine stream starts and stops?: No Trouble starting stream?: No Do you have to strain to urinate?: No Blood in urine?: No Urinary tract infection?: No Sexually transmitted disease?: No Injury to kidneys or bladder?: No Painful intercourse?: No Weak stream?: No Currently pregnant?: No Vaginal bleeding?: No Last menstrual period?: n  Gastrointestinal Nausea?: No Vomiting?: No Indigestion/heartburn?: No Diarrhea?: No Constipation?: No  Constitutional Fever: No Night sweats?: Yes Weight loss?: No Fatigue?: No  Skin Skin rash/lesions?: No Itching?: No  Eyes Blurred vision?: No Double vision?: No  Ears/Nose/Throat Sore throat?: No Sinus problems?: Yes  Hematologic/Lymphatic Swollen glands?: No Easy bruising?: No  Cardiovascular Leg swelling?: No Chest pain?: No  Respiratory Cough?: Yes Shortness of breath?: Yes  Endocrine Excessive thirst?: No  Musculoskeletal Back pain?: Yes Joint pain?: No  Neurological Headaches?: No Dizziness?: No  Psychologic Depression?: No Anxiety?: No  Physical Exam: BP (!) 153/74   Pulse 88   Ht 5\' 2"  (1.575 m)   Wt 184 lb (83.5 kg)   BMI 33.65 kg/m   Constitutional:  Alert and oriented, No acute distress. HEENT: [place] AT, moist mucus membranes.  Trachea midline, no masses. Cardiovascular: No clubbing, cyanosis, or edema. Respiratory: Normal respiratory effort, no increased work of breathing. GI: Abdomen is soft, nontender, nondistended, no abdominal masses GU: No CVA tenderness.  Skin: No rashes, bruises or suspicious lesions. Lymph: No cervical or inguinal adenopathy. Neurologic: Grossly intact, no focal deficits, moving all 4 extremities. Psychiatric: Normal mood and affect.  Laboratory Data: Lab Results  Component Value Date   WBC 7.8 02/20/2017   HGB 14.1 02/20/2017   HCT 41.1 02/20/2017   MCV 89.1  02/20/2017   PLT 261 02/20/2017    Lab Results  Component Value Date   CREATININE 0.85 02/20/2017   Urinalysis Dipstick: 1+ leukocytes Microscopy: 11-30 WBC/0 RBC   Assessment & Plan:    1. Microscopic hematuria Urinalysis today shows no microhematuria but significant pyuria.  A urine culture was ordered.  If the culture is positive she will be treated with antibiotics and will obtain a follow-up visit for symptom check and repeat urinalysis.  If her urine culture is negative I recommended scheduling cystoscopy.  Recent upper tract imaging was negative.  - Urinalysis, Complete - CULTURE, URINE COMPREHENSIVE  2. Urinary frequency No significant PVR by bladder scan.  - BLADDER SCAN AMB NON-IMAGING    Abbie Sons, MD  [facility] [address] [place], [place] [address] [phone]

## 2017-04-15 LAB — CULTURE, URINE COMPREHENSIVE

## 2017-04-18 ENCOUNTER — Telehealth: Payer: Self-pay

## 2017-04-18 ENCOUNTER — Other Ambulatory Visit: Payer: Self-pay | Admitting: Urology

## 2017-04-18 MED ORDER — NITROFURANTOIN MONOHYD MACRO 100 MG PO CAPS: 100.0000 mg | ORAL_CAPSULE | Freq: Two times a day (BID) | ORAL | 0 refills | Status: DC

## 2017-04-18 NOTE — Telephone Encounter (Signed)
Spoke with pt in reference to +ucx, abx, and 19mo f/u. Pt voiced understanding. Appt made.

## 2017-04-18 NOTE — Telephone Encounter (Signed)
-----   Message from Abbie Sons, MD sent at 04/18/2017  7:27 AM EST ----- Urine culture was positive for infection.  Antibiotic Rx was sent to pharmacy.  Recommend follow-up 1 month.

## 2017-05-23 ENCOUNTER — Ambulatory Visit (INDEPENDENT_AMBULATORY_CARE_PROVIDER_SITE_OTHER): Payer: Medicare Other | Admitting: Urology

## 2017-05-23 ENCOUNTER — Encounter: Payer: Self-pay | Admitting: Urology

## 2017-05-23 VITALS — BP 136/88 | HR 96 | Ht 64.0 in | Wt 183.0 lb

## 2017-05-23 DIAGNOSIS — R3129 Other microscopic hematuria: Secondary | ICD-10-CM

## 2017-05-23 LAB — URINALYSIS, COMPLETE
BILIRUBIN UA: NEGATIVE
GLUCOSE, UA: NEGATIVE
KETONES UA: NEGATIVE
Leukocytes, UA: NEGATIVE
NITRITE UA: NEGATIVE
Protein, UA: NEGATIVE
RBC UA: NEGATIVE
SPEC GRAV UA: 1.01 (ref 1.005–1.030)
UUROB: 0.2 mg/dL (ref 0.2–1.0)
pH, UA: 7 (ref 5.0–7.5)

## 2017-05-23 NOTE — Progress Notes (Signed)
05/23/2017 10:48 AM   [name] 09-19-1940 [id number]  Referring provider: Marinda Elk, MD [facility] [place], [place] [address]  Chief Complaint  Patient presents with  . Follow-up    HPI: 77 year old female presents for follow-up.  Refer to my previous note of 04/12/2017.  Her urine culture did grow E. coli and she has completed her antibiotics.  Her voiding symptoms have resolved.  She denies flank, abdominal or pelvic pain.   PMH: Past Medical History:  Diagnosis Date  . Allergy    seasonal  . Arthritis    degenerative of hip right  . Asthma   . Barrett esophagus   . 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 Migraine   . 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 Non Hodgkin's lymphoma ([place])   . Skin cancer     Surgical History: Past Surgical History:  Procedure Laterality Date  . ABDOMINAL HYSTERECTOMY    . APPENDECTOMY    . BASAL CELL CARCINOMA EXCISION     on both sides of nose  . BREAST BIOPSY Left 12/2013   hyperplasia  . CHOLECYSTECTOMY    . ESOPHAGOGASTRODUODENOSCOPY (EGD) WITH PROPOFOL N/A 03/30/2015   Procedure: ESOPHAGOGASTRODUODENOSCOPY (EGD) WITH PROPOFOL;  Surgeon: Lollie Sails, MD;  Location: [facility] ENDOSCOPY;  Service: Endoscopy;  Laterality: N/A;  . FACIAL COSMETIC SURGERY    . history of multiple colonic polyps    . lymph node removal from left groin    . OOPHORECTOMY    . VENTRAL HERNIA REPAIR N/A 10/02/2014   Procedure: HERNIA REPAIR VENTRAL ADULT;  Surgeon: Leonie Green, MD;  Location: [facility] ORS;  Service: General;  Laterality: N/A;  with mesh    Home Medications:  Allergies as of 05/23/2017      Reactions   No Known Allergies    Buprenorphine Hcl Nausea And Vomiting   Morphine And Related Nausea And Vomiting      Medication List        Accurate as of 05/23/17 10:48 AM. Always use your most recent med list.          aspirin EC 81 MG  tablet Take 81 mg by mouth.   fluticasone 50 MCG/ACT nasal spray Commonly known as:  FLONASE 2 sprays by Each Nare route daily.   lisinopril 10 MG tablet Commonly known as:  PRINIVIL,ZESTRIL   loperamide 2 MG tablet Commonly known as:  IMODIUM A-D Take 2 mg by mouth 4 (four) times daily as needed for diarrhea or loose stools.   loratadine 10 MG tablet Commonly known as:  CLARITIN Take 10 mg by mouth every morning.   omeprazole 20 MG capsule Commonly known as:  PRILOSEC       Allergies:  Allergies  Allergen Reactions  . No Known Allergies   . Buprenorphine Hcl Nausea And Vomiting  . Morphine And Related Nausea And Vomiting    Family History: Family History  Problem Relation Age of Onset  . Breast cancer Neg Hx   . Bladder Cancer Neg Hx   . Prostate cancer Neg Hx   . Kidney cancer Neg Hx     Social History:  reports that  has never smoked. she has never used smokeless tobacco. She reports that she drinks about 0.6 oz of alcohol per week. She reports that she does not use drugs.  ROS: No significant change from 04/12/2017 except as noted in  the HPI  Physical Exam: BP 136/88   Pulse 96   Ht 5\' 4"  (1.626 m)   Wt 183 lb (83 kg)   BMI 31.41 kg/m   Constitutional:  Alert and oriented, No acute distress. HEENT: [place] AT, moist mucus membranes.  Trachea midline, no masses. Cardiovascular: No clubbing, cyanosis, or edema. Respiratory: Normal respiratory effort, no increased work of breathing. GI: Abdomen is soft, nontender, nondistended, no abdominal masses GU: No CVA tenderness.  Skin: No rashes, bruises or suspicious lesions. Lymph: No cervical or inguinal adenopathy. Neurologic: Grossly intact, no focal deficits, moving all 4 extremities. Psychiatric: Normal mood and affect.  Laboratory Data: Lab Results  Component Value Date   WBC 7.8 02/20/2017   HGB 14.1 02/20/2017   HCT 41.1 02/20/2017   MCV 89.1 02/20/2017   PLT 261 02/20/2017    Lab Results    Component Value Date   CREATININE 0.85 02/20/2017    Urinalysis Dipstick negative/microscopy negative   Assessment & Plan:   UTI resolved.  Her urinalysis today is clear.  Would recommend a follow-up urinalysis in 3 months.  Return earlier for recurrent UTI symptoms.   Abbie Sons, [place] [address] [place], [place] [address] [phone]

## 2017-06-04 ENCOUNTER — Encounter: Payer: Self-pay | Admitting: *Deleted

## 2017-06-05 ENCOUNTER — Ambulatory Visit: Admission: RE | Admit: 2017-06-05 | Payer: Medicare Other | Source: Ambulatory Visit | Admitting: Gastroenterology

## 2017-06-05 ENCOUNTER — Ambulatory Visit
Admission: RE | Admit: 2017-06-05 | Discharge: 2017-06-05 | Disposition: A | Discharge status: Home / Self Care | Payer: Medicare Other | Source: Ambulatory Visit | Attending: Gastroenterology | Admitting: Gastroenterology

## 2017-06-05 ENCOUNTER — Encounter: Payer: Self-pay | Admitting: Anesthesiology

## 2017-06-05 ENCOUNTER — Ambulatory Visit: Payer: Medicare Other | Admitting: Anesthesiology

## 2017-06-05 ENCOUNTER — Encounter: Admission: RE | Payer: Self-pay | Source: Ambulatory Visit

## 2017-06-05 ENCOUNTER — Encounter
Admission: RE | Disposition: A | Discharge status: Home / Self Care | Payer: Self-pay | Source: Ambulatory Visit | Attending: Gastroenterology

## 2017-06-05 DIAGNOSIS — Z7982 Long term (current) use of aspirin: Secondary | ICD-10-CM | POA: Insufficient documentation

## 2017-06-05 DIAGNOSIS — Z888 Allergy status to other drugs, medicaments and biological substances status: Secondary | ICD-10-CM | POA: Insufficient documentation

## 2017-06-05 DIAGNOSIS — Z1211 Encounter for screening for malignant neoplasm of colon: Secondary | ICD-10-CM | POA: Insufficient documentation

## 2017-06-05 DIAGNOSIS — K210 Gastro-esophageal reflux disease with esophagitis: Secondary | ICD-10-CM | POA: Diagnosis not present

## 2017-06-05 DIAGNOSIS — K621 Rectal polyp: Secondary | ICD-10-CM | POA: Insufficient documentation

## 2017-06-05 DIAGNOSIS — D125 Benign neoplasm of sigmoid colon: Secondary | ICD-10-CM | POA: Insufficient documentation

## 2017-06-05 DIAGNOSIS — Z79899 Other long term (current) drug therapy: Secondary | ICD-10-CM | POA: Diagnosis not present

## 2017-06-05 DIAGNOSIS — K644 Residual hemorrhoidal skin tags: Secondary | ICD-10-CM | POA: Diagnosis not present

## 2017-06-05 DIAGNOSIS — K2270 Barrett's esophagus without dysplasia: Secondary | ICD-10-CM | POA: Diagnosis not present

## 2017-06-05 DIAGNOSIS — J309 Allergic rhinitis, unspecified: Secondary | ICD-10-CM | POA: Insufficient documentation

## 2017-06-05 DIAGNOSIS — Z7951 Long term (current) use of inhaled steroids: Secondary | ICD-10-CM | POA: Insufficient documentation

## 2017-06-05 DIAGNOSIS — M1611 Unilateral primary osteoarthritis, right hip: Secondary | ICD-10-CM | POA: Diagnosis not present

## 2017-06-05 DIAGNOSIS — Z85828 Personal history of other malignant neoplasm of skin: Secondary | ICD-10-CM | POA: Insufficient documentation

## 2017-06-05 DIAGNOSIS — Z87891 Personal history of nicotine dependence: Secondary | ICD-10-CM | POA: Insufficient documentation

## 2017-06-05 DIAGNOSIS — G43909 Migraine, unspecified, not intractable, without status migrainosus: Secondary | ICD-10-CM | POA: Insufficient documentation

## 2017-06-05 DIAGNOSIS — K449 Diaphragmatic hernia without obstruction or gangrene: Secondary | ICD-10-CM | POA: Diagnosis not present

## 2017-06-05 DIAGNOSIS — C8590 Non-Hodgkin lymphoma, unspecified, unspecified site: Secondary | ICD-10-CM | POA: Diagnosis not present

## 2017-06-05 DIAGNOSIS — K2970 Gastritis, unspecified, without bleeding: Secondary | ICD-10-CM | POA: Insufficient documentation

## 2017-06-05 DIAGNOSIS — Z86010 Personal history of colonic polyps: Secondary | ICD-10-CM | POA: Insufficient documentation

## 2017-06-05 DIAGNOSIS — K635 Polyp of colon: Secondary | ICD-10-CM | POA: Insufficient documentation

## 2017-06-05 DIAGNOSIS — Z885 Allergy status to narcotic agent status: Secondary | ICD-10-CM | POA: Diagnosis not present

## 2017-06-05 DIAGNOSIS — J449 Chronic obstructive pulmonary disease, unspecified: Secondary | ICD-10-CM | POA: Diagnosis not present

## 2017-06-05 DIAGNOSIS — D123 Benign neoplasm of transverse colon: Secondary | ICD-10-CM | POA: Diagnosis not present

## 2017-06-05 DIAGNOSIS — Z9889 Other specified postprocedural states: Secondary | ICD-10-CM | POA: Insufficient documentation

## 2017-06-05 DIAGNOSIS — K319 Disease of stomach and duodenum, unspecified: Secondary | ICD-10-CM | POA: Insufficient documentation

## 2017-06-05 DIAGNOSIS — Z90710 Acquired absence of both cervix and uterus: Secondary | ICD-10-CM | POA: Insufficient documentation

## 2017-06-05 DIAGNOSIS — K640 First degree hemorrhoids: Secondary | ICD-10-CM | POA: Insufficient documentation

## 2017-06-05 DIAGNOSIS — Z9049 Acquired absence of other specified parts of digestive tract: Secondary | ICD-10-CM | POA: Insufficient documentation

## 2017-06-05 HISTORY — DX: Unspecified asthma, uncomplicated: J45.909

## 2017-06-05 HISTORY — DX: Essential tremor: G25.0

## 2017-06-05 HISTORY — PX: COLONOSCOPY WITH PROPOFOL: SHX5780

## 2017-06-05 HISTORY — DX: Unspecified chronic bronchitis: J42

## 2017-06-05 HISTORY — DX: Allergic rhinitis, unspecified: J30.9

## 2017-06-05 HISTORY — PX: ESOPHAGOGASTRODUODENOSCOPY (EGD) WITH PROPOFOL: SHX5813

## 2017-06-05 HISTORY — DX: Gastro-esophageal reflux disease without esophagitis: K21.9

## 2017-06-05 LAB — CBC
HCT: 46.1 % (ref 35.0–47.0)
Hemoglobin: 15.6 g/dL (ref 12.0–16.0)
MCH: 30.1 pg (ref 26.0–34.0)
MCHC: 33.9 g/dL (ref 32.0–36.0)
MCV: 88.8 fL (ref 80.0–100.0)
PLATELETS: 338 10*3/uL (ref 150–440)
RBC: 5.2 MIL/uL (ref 3.80–5.20)
RDW: 13.5 % (ref 11.5–14.5)
WBC: 6.1 10*3/uL (ref 3.6–11.0)

## 2017-06-05 LAB — PROTIME-INR
INR: 0.97
Prothrombin Time: 12.8 seconds (ref 11.4–15.2)

## 2017-06-05 SURGERY — ESOPHAGOGASTRODUODENOSCOPY (EGD) WITH PROPOFOL
Anesthesia: General

## 2017-06-05 SURGERY — COLONOSCOPY WITH PROPOFOL
Anesthesia: General

## 2017-06-05 MED ORDER — PROPOFOL 500 MG/50ML IV EMUL
INTRAVENOUS | Status: DC | PRN
  Administered 2017-06-05: 120 ug/kg/min via INTRAVENOUS

## 2017-06-05 MED ORDER — SODIUM CHLORIDE 0.9 % IV SOLN: INTRAVENOUS | Status: DC

## 2017-06-05 MED ORDER — GLYCOPYRROLATE 0.2 MG/ML IJ SOLN
INTRAMUSCULAR | Status: DC | PRN
  Administered 2017-06-05: 0.2 mg via INTRAVENOUS

## 2017-06-05 MED ORDER — SODIUM CHLORIDE 0.9 % IV SOLN
INTRAVENOUS | Status: DC
  Administered 2017-06-05: 1000 mL via INTRAVENOUS

## 2017-06-05 MED ORDER — PROPOFOL 10 MG/ML IV BOLUS
INTRAVENOUS | Status: DC | PRN
  Administered 2017-06-05 (×2): 50 mg via INTRAVENOUS

## 2017-06-05 MED ORDER — LIDOCAINE 2% (20 MG/ML) 5 ML SYRINGE
INTRAMUSCULAR | Status: DC | PRN
  Administered 2017-06-05: 25 mg via INTRAVENOUS

## 2017-06-05 MED ORDER — MIDAZOLAM HCL 5 MG/5ML IJ SOLN
INTRAMUSCULAR | Status: DC | PRN
  Administered 2017-06-05: 1 mg via INTRAVENOUS

## 2017-06-05 NOTE — H&P (Signed)
Outpatient short stay form Pre-procedure 06/05/2017 8:31 AM Lollie Sails MD  Primary Physician: Jackelyn Poling NP  Reason for visit:  EGD, colonoscopy  History of present illness:  Patient is a 77 year old female presenting today as above. She has personal history of adenomatous colon polyps as well as Barrett's esophagus. She is presenting today for follow-up on these issues. She tolerated her prep well. She takes no aspirin or blood thinning agent.    Current Facility-Administered Medications:  .  0.9 %  sodium chloride infusion, , Intravenous, Continuous, Lollie Sails, MD, Last Rate: 20 mL/hr at 06/05/17 0802, 1,000 mL at 06/05/17 0802 .  0.9 %  sodium chloride infusion, , Intravenous, Continuous, Lollie Sails, MD  Medications Prior to Admission  Medication Sig Dispense Refill Last Dose  . ALBUTEROL IN Inhale 90 mcg into the lungs every 4 (four) hours as needed.   Past Week at Unknown time  . aspirin EC 81 MG tablet Take 81 mg by mouth.   Past Week at Unknown time  . fluticasone (FLONASE) 50 MCG/ACT nasal spray 2 sprays by Each Nare route daily.   Past Week at Unknown time  . lisinopril (PRINIVIL,ZESTRIL) 10 MG tablet    06/04/2017 at Unknown time  . loperamide (IMODIUM A-D) 2 MG tablet Take 2 mg by mouth 4 (four) times daily as needed for diarrhea or loose stools.   Past Week at Unknown time  . loratadine (CLARITIN) 10 MG tablet Take 10 mg by mouth every morning.    06/04/2017 at Unknown time  . omeprazole (PRILOSEC) 20 MG capsule    06/04/2017 at Unknown time     Allergies  Allergen Reactions  . No Known Allergies   . Buprenorphine Hcl Nausea And Vomiting  . Morphine And Related Nausea And Vomiting     Past Medical History:  Diagnosis Date  . Allergic rhinitis   . Allergy    seasonal  . Arthritis    degenerative of hip right  . Asthma   . Barrett esophagus   . Barrett esophagus   . Benign essential tremor   . Chronic bronchitis ([place])   . GERD  (gastroesophageal reflux disease)   . 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 Migraine   . 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 Non Hodgkin's lymphoma ([place])   . Non Hodgkin's lymphoma ([place])   . RAD (reactive airway disease)   . Skin cancer     Review of systems:      Physical Exam    Heart and lungs: egular rate and rhythm without rub or gallop, lungs are bilaterally clear.    HEENT: normocephalic atraumatic eyes are anicteric    Other:     Pertinant exam for procedure: soft nontender nondistended bowel sounds positive normoactive.    Planned proceedures: eGD, colonoscopy and indicated procedures. I have discussed the risks benefits and complications of procedures to include not limited to bleeding, infection, perforation and the risk of sedation and the patient wishes to proceed.    Lollie Sails, MD Gastroenterology 06/05/2017  8:31 AM

## 2017-06-05 NOTE — Anesthesia Postprocedure Evaluation (Signed)
Anesthesia Post Note  Patient: [name]  Procedure(s) Performed: COLONOSCOPY WITH PROPOFOL (N/A ) ESOPHAGOGASTRODUODENOSCOPY (EGD) WITH PROPOFOL (N/A )  Patient location during evaluation: PACU Anesthesia Type: General Level of consciousness: awake and alert and oriented Pain management: pain level controlled Vital Signs Assessment: post-procedure vital signs reviewed and stable Respiratory status: spontaneous breathing Cardiovascular status: blood pressure returned to baseline Anesthetic complications: no     Last Vitals:  Vitals:   06/05/17 1006 06/05/17 1023  BP:  130/87  Pulse: (!) 37 77  Resp: 16 (!) 22  Temp:    SpO2: 96% 97%    Last Pain:  Vitals:   06/05/17 1023  TempSrc:   PainSc: 1                  [name]

## 2017-06-05 NOTE — Transfer of Care (Signed)
Immediate Anesthesia Transfer of Care Note  Patient: [name]  Procedure(s) Performed: COLONOSCOPY WITH PROPOFOL (N/A ) ESOPHAGOGASTRODUODENOSCOPY (EGD) WITH PROPOFOL (N/A )  Patient Location: Endoscopy Unit  Anesthesia Type:General  Level of Consciousness: awake  Airway & Oxygen Therapy: Patient Spontanous Breathing and Patient connected to nasal cannula oxygen  Post-op Assessment: Report given to RN and Post -op Vital signs reviewed and stable  Post vital signs: Reviewed  Last Vitals:  Vitals:   06/05/17 0939 06/05/17 0940  BP: 113/76 113/76  Pulse: 87 86  Resp: 19 19  Temp: (!) 36.1 C (!) 36.1 C  SpO2: 91% 94%    Last Pain:  Vitals:   06/05/17 0939  TempSrc: Tympanic         Complications: No apparent anesthesia complications

## 2017-06-05 NOTE — Anesthesia Post-op Follow-up Note (Signed)
Anesthesia QCDR form completed.        

## 2017-06-05 NOTE — Anesthesia Preprocedure Evaluation (Signed)
Anesthesia Evaluation  Patient identified by MRN, date of birth, ID band Patient awake    Reviewed: Allergy & Precautions, H&P , NPO status , Patient's Chart, lab work & pertinent test results  History of Anesthesia Complications Negative for: history of anesthetic complications  Airway Mallampati: III  TM Distance: >3 FB Neck ROM: limited    Dental  (+) Poor Dentition, Chipped   Pulmonary asthma , COPD, former smoker (worked around smokers),    Pulmonary exam normal breath sounds clear to auscultation       Cardiovascular Exercise Tolerance: Good (-) angina(-) Past MI and (-) DOE negative cardio ROS Normal cardiovascular exam Rhythm:regular Rate:Normal     Neuro/Psych  Headaches, negative psych ROS   GI/Hepatic Neg liver ROS, GERD  Controlled,  Endo/Other  negative endocrine ROS  Renal/GU negative Renal ROS  negative genitourinary   Musculoskeletal  (+) Arthritis ,   Abdominal   Peds  Hematology negative hematology ROS (+)   Anesthesia Other Findings Past Medical History:   Non Hodgkin's lymphoma (HCC)                                 Barrett esophagus                                            Skin cancer                                                  Asthma                                                       Migraine                                                     HSV (herpes simplex virus) infection                           Comment:of upper lip   Allergy                                                        Comment:seasonal   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     Arthritis                                                      Comment:degenerative of hip right  Past Surgical History:   lymph node removal from left groin  CHOLECYSTECTOMY                                               FACIAL COSMETIC SURGERY                                      APPENDECTOMY                                                  ABDOMINAL HYSTERECTOMY                                        history of multiple colonic polyps                            BASAL CELL CARCINOMA EXCISION                                   Comment:on both sides of nose   VENTRAL HERNIA REPAIR                           N/A 10/02/2014      Comment:Procedure: HERNIA REPAIR VENTRAL ADULT;                Surgeon: Leonie Green, MD;  Location:               [facility] ORS;  Service: General;  Laterality: N/A;               with mesh   BREAST BIOPSY                                   Left 12/2013         Comment:hyperplasia     Reproductive/Obstetrics negative OB ROS                             Anesthesia Physical  Anesthesia Plan  ASA: III  Anesthesia Plan: General   Post-op Pain Management:    Induction: Intravenous  PONV Risk Score and Plan:   Airway Management Planned: Nasal Cannula  Additional Equipment:   Intra-op Plan:   Post-operative Plan:   Informed Consent: I have reviewed the patients History and Physical, chart, labs and discussed the procedure including the risks, benefits and alternatives for the proposed anesthesia with the patient or authorized representative who has indicated his/her understanding and acceptance.   Dental Advisory Given  Plan Discussed with: Anesthesiologist, CRNA and Surgeon  Anesthesia Plan Comments:         Anesthesia Quick Evaluation

## 2017-06-05 NOTE — Op Note (Signed)
[facility] Gastroenterology Patient Name: [name] Procedure Date: 06/05/2017 8:33 AM MRN: [id number] Account #: [id number] Date of Birth: [birth year] Admit Type: Outpatient Age: 77 Room: [facility] ENDO ROOM 3 Gender: Female Note Status: Finalized Procedure:            Upper GI endoscopy Indications:          Follow-up of Barrett's esophagus Providers:            Lollie Sails, MD Referring MD:         Precious Bard, MD (Referring MD) Medicines:            Monitored Anesthesia Care Complications:        No immediate complications. Procedure:            Pre-Anesthesia Assessment:                       - ASA Grade Assessment: III - A patient with severe                        systemic disease.                       After obtaining informed consent, the endoscope was                        passed under direct vision. Throughout the procedure,                        the patient's blood pressure, pulse, and oxygen                        saturations were monitored continuously. The Endoscope                        was introduced through the mouth, and advanced to the                        third part of duodenum. The upper GI endoscopy was                        accomplished without difficulty. The patient tolerated                        the procedure well. Findings:      There were esophageal mucosal changes secondary to established       short-segment Barrett's disease present in the distal esophagus. The       maximum longitudinal extent of these mucosal changes was 3 cm in length.       Mucosa was biopsied with a cold forceps for histology in 4 quadrants at       intervals of 2 cm at 35 and 37 cm from the incisors. A total of 2       specimen bottles were sent to pathology.      A small hiatal hernia was found. The Z-line was a variable distance from       incisors; the hiatal hernia was sliding.      The exam of the esophagus was otherwise normal.   Patchy mild inflammation characterized by erythema was found in the       stomach. Biopsies were taken with a  cold forceps for histology.      The cardia and gastric fundus were normal on retroflexion otherwise.      The examined duodenum was normal.      granular lesion found on the tip of tongue. Photograph taken Impression:           - Esophageal mucosal changes secondary to established                        short-segment Barrett's disease. Biopsied.                       - Small hiatal hernia.                       - Gastritis. Biopsied.                       - Normal examined duodenum. Recommendation:       - Discharge patient to home.                       - Continue present medications. Procedure Code(s):    --- Professional ---                       [phone], Esophagogastroduodenoscopy, flexible, transoral;                        with biopsy, single or multiple Diagnosis Code(s):    --- Professional ---                       K22.70, Barrett's esophagus without dysplasia                       K44.9, Diaphragmatic hernia without obstruction or                        gangrene                       K29.70, Gastritis, unspecified, without bleeding CPT copyright 2016 American Medical Association. All rights reserved. The codes documented in this report are preliminary and upon coder review may  be revised to meet current compliance requirements. Lollie Sails, MD 06/05/2017 8:57:10 AM This report has been signed electronically. Number of Addenda: 0 Note Initiated On: 06/05/2017 8:33 AM      [facility]

## 2017-06-05 NOTE — Op Note (Signed)
[facility] Patient Name: [name] Procedure Date: 06/05/2017 8:32 AM MRN: [id number] Account #: [id number] Date of Birth: [birth year] Admit Type: Outpatient Age: 77 Room: [facility] ENDO ROOM 3 Gender: Female Note Status: Finalized Procedure:            Colonoscopy Indications:          Personal history of colonic polyps Providers:            Lollie Sails, MD Referring MD:         Precious Bard, MD (Referring MD) Medicines:            Monitored Anesthesia Care Complications:        No immediate complications. Procedure:            Pre-Anesthesia Assessment:                       - ASA Grade Assessment: III - A patient with severe                        systemic disease.                       After obtaining informed consent, the colonoscope was                        passed under direct vision. Throughout the procedure,                        the patient's blood pressure, pulse, and oxygen                        saturations were monitored continuously. The                        Colonoscope was introduced through the anus and                        advanced to the the cecum, identified by appendiceal                        orifice and ileocecal valve. The colonoscopy was                        performed with moderate difficulty due to a tortuous                        colon. Successful completion of the procedure was aided                        by changing the patient to a supine position, changing                        the patient to a prone position and using manual                        pressure. The patient tolerated the procedure well. The                        quality of the bowel preparation was good. Findings:      A 2 mm polyp  was found in the transverse colon. The polyp was sessile.       The polyp was removed with a cold biopsy forceps. Resection and       retrieval were complete.      A 3 mm polyp was found in the  splenic flexure. The polyp was sessile.       The polyp was removed with a cold snare. Resection and retrieval were       complete.      A 4 mm polyp was found in the distal sigmoid colon. The polyp was       sessile. The polyp was removed with a cold snare. Resection and       retrieval were complete.      Four sessile polyps were found in the sigmoid colon. The polyps were 1       to 2 mm in size. These polyps were removed with a cold biopsy forceps.       Resection and retrieval were complete.      A 2 mm polyp was found in the rectum. The polyp was sessile. The polyp       was removed with a cold biopsy forceps. Resection and retrieval were       complete.      Non-bleeding external and internal hemorrhoids were found during       retroflexion and during anoscopy. The hemorrhoids were small and Grade I       (internal hemorrhoids that do not prolapse).      No additional abnormalities were found on retroflexion.      The digital rectal exam was normal. Impression:           - One 2 mm polyp in the transverse colon, removed with                        a cold biopsy forceps. Resected and retrieved.                       - One 3 mm polyp at the splenic flexure, removed with a                        cold snare. Resected and retrieved.                       - One 4 mm polyp in the distal sigmoid colon, removed                        with a cold snare. Resected and retrieved.                       - Four 1 to 2 mm polyps in the sigmoid colon, removed                        with a cold biopsy forceps. Resected and retrieved.                       - One 2 mm polyp in the rectum, removed with a cold                        biopsy forceps. Resected and retrieved.                       -  Non-bleeding external and internal hemorrhoids. Recommendation:       - Await pathology results.                       - Telephone GI clinic for pathology results in 1 week. Procedure Code(s):    ---  Professional ---                       [phone], Colonoscopy, flexible; with removal of tumor(s),                        polyp(s), or other lesion(s) by snare technique                       [id number], 63, Colonoscopy, flexible; with biopsy, single                        or multiple Diagnosis Code(s):    --- Professional ---                       D12.3, Benign neoplasm of transverse colon (hepatic                        flexure or splenic flexure)                       D12.5, Benign neoplasm of sigmoid colon                       K62.1, Rectal polyp                       K64.0, First degree hemorrhoids                       Z86.010, Personal history of colonic polyps CPT copyright 2016 American Medical [employer]. All rights reserved. The codes documented in this report are preliminary and upon coder review may  be revised to meet current compliance requirements. Lollie Sails, MD 06/05/2017 9:39:24 AM This report has been signed electronically. Number of Addenda: 0 Note Initiated On: 06/05/2017 8:32 AM Scope Withdrawal Time: 0 hours 17 minutes 12 seconds  Total Procedure Duration: 0 hours 35 minutes 28 seconds       [facility]

## 2017-06-06 ENCOUNTER — Encounter: Payer: Self-pay | Admitting: Gastroenterology

## 2017-06-07 LAB — SURGICAL PATHOLOGY

## 2017-08-21 ENCOUNTER — Other Ambulatory Visit: Payer: Medicare Other

## 2017-08-21 ENCOUNTER — Encounter: Payer: Self-pay | Admitting: Urology

## 2017-08-22 ENCOUNTER — Inpatient Hospital Stay: Payer: Medicare Other

## 2017-08-22 ENCOUNTER — Inpatient Hospital Stay: Payer: Medicare Other | Attending: Internal Medicine | Admitting: Internal Medicine

## 2017-08-22 VITALS — HR 81 | Temp 97.6°F | Resp 16 | Wt 183.6 lb

## 2017-08-22 DIAGNOSIS — C4331 Malignant melanoma of nose: Secondary | ICD-10-CM

## 2017-08-22 DIAGNOSIS — C8203 Follicular lymphoma grade I, intra-abdominal lymph nodes: Secondary | ICD-10-CM | POA: Diagnosis not present

## 2017-08-22 DIAGNOSIS — R51 Headache: Secondary | ICD-10-CM | POA: Insufficient documentation

## 2017-08-22 DIAGNOSIS — Z8051 Family history of malignant neoplasm of kidney: Secondary | ICD-10-CM | POA: Insufficient documentation

## 2017-08-22 DIAGNOSIS — Z7982 Long term (current) use of aspirin: Secondary | ICD-10-CM | POA: Diagnosis not present

## 2017-08-22 DIAGNOSIS — Z79899 Other long term (current) drug therapy: Secondary | ICD-10-CM | POA: Diagnosis not present

## 2017-08-22 LAB — CBC WITH DIFFERENTIAL/PLATELET
BASOS ABS: 0 10*3/uL (ref 0–0.1)
BASOS PCT: 1 %
Eosinophils Absolute: 0.1 10*3/uL (ref 0–0.7)
Eosinophils Relative: 1 %
HEMATOCRIT: 45 % (ref 35.0–47.0)
Hemoglobin: 15.6 g/dL (ref 12.0–16.0)
Lymphocytes Relative: 48 %
Lymphs Abs: 3.3 10*3/uL (ref 1.0–3.6)
MCH: 31.2 pg (ref 26.0–34.0)
MCHC: 34.7 g/dL (ref 32.0–36.0)
MCV: 90 fL (ref 80.0–100.0)
MONOS PCT: 10 %
Monocytes Absolute: 0.6 10*3/uL (ref 0.2–0.9)
NEUTROS ABS: 2.7 10*3/uL (ref 1.4–6.5)
NEUTROS PCT: 40 %
Platelets: 305 10*3/uL (ref 150–440)
RBC: 5 MIL/uL (ref 3.80–5.20)
RDW: 13.5 % (ref 11.5–14.5)
WBC: 6.7 10*3/uL (ref 3.6–11.0)

## 2017-08-22 LAB — COMPREHENSIVE METABOLIC PANEL
ALBUMIN: 4.7 g/dL (ref 3.5–5.0)
ALK PHOS: 67 U/L (ref 38–126)
ALT: 55 U/L — ABNORMAL HIGH (ref 14–54)
AST: 51 U/L — AB (ref 15–41)
Anion gap: 10 (ref 5–15)
BILIRUBIN TOTAL: 0.9 mg/dL (ref 0.3–1.2)
BUN: 19 mg/dL (ref 6–20)
CALCIUM: 9.9 mg/dL (ref 8.9–10.3)
CO2: 26 mmol/L (ref 22–32)
Chloride: 102 mmol/L (ref 101–111)
Creatinine, Ser: 0.86 mg/dL (ref 0.44–1.00)
GFR calc Af Amer: >60 mL/min (ref >60)
GFR calc non Af Amer: >60 mL/min (ref >60)
GLUCOSE: 118 mg/dL — AB (ref 65–99)
Potassium: 3.9 mmol/L (ref 3.5–5.1)
Sodium: 138 mmol/L (ref 135–145)
TOTAL PROTEIN: 7.8 g/dL (ref 6.5–8.1)

## 2017-08-22 NOTE — Progress Notes (Signed)
[facility] OFFICE PROGRESS NOTE  Patient Care Team: Marinda Elk, MD as PCP - General (Physician Assistant)  Cancer Staging No matching staging information was found for the patient.   Oncology History     1.FEB 2013-  MESENTERIC LYMPHADENOPATHY;] Pet positive lymphadenopathy.; . 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 3. Rituxan weekly x4 finished in March of 2013 4. PET scan in July of 2013 complete remission. 5. Maintenance Rituxan started in July of 2013;  x 2 years [finished 2015]  #April 2019-melanoma/left side of the nose [Dr. Nehemiah Massed   # LFTs- slightly up/ ? faty liver.   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  female [place] patient above history of Follow-up to lymphoma status post Rituxan maintenance finished in 2015 is here for follow-up.  Patient states that she had a recent biopsy of a lesion on the left side of the nose-positive for melanoma.  She is awaiting repeat evaluation with dermatology.  Otherwise patient denies any lumps or bumps. Appetite is good. No weight loss;  gaining weight.. No nausea no vomiting. No night sweats. No fevers or chills.  Patient does complain of intermittent headaches which is chronic.  REVIEW OF SYSTEMS:  A complete 10 point review of system is done which is negative except mentioned above/history of present illness.   PAST MEDICAL HISTORY :  Past Medical History:  Diagnosis Date  . Allergic rhinitis   . Allergy    seasonal  . Arthritis    degenerative of hip right  . Asthma   . Barrett esophagus   . Barrett esophagus   . Benign essential tremor   . Chronic bronchitis ([place])   . GERD (gastroesophageal reflux disease)    . 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 Migraine   . 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 Non Hodgkin's lymphoma ([place])   . Non Hodgkin's lymphoma ([place])   . RAD (reactive airway disease)   . Skin cancer     PAST SURGICAL HISTORY :   Past Surgical History:  Procedure Laterality Date  . ABDOMINAL HYSTERECTOMY    . APPENDECTOMY    . BASAL CELL CARCINOMA EXCISION     on both sides of nose  . BREAST BIOPSY Left 12/2013   hyperplasia  . CHOLECYSTECTOMY    . COLONOSCOPY WITH PROPOFOL N/A 06/05/2017   Procedure: COLONOSCOPY WITH PROPOFOL;  Surgeon: Lollie Sails, MD;  Location: [facility] ENDOSCOPY;  Service: Endoscopy;  Laterality: N/A;  . ESOPHAGOGASTRODUODENOSCOPY (EGD) WITH PROPOFOL N/A 03/30/2015   Procedure: ESOPHAGOGASTRODUODENOSCOPY (EGD) WITH PROPOFOL;  Surgeon: Lollie Sails, MD;  Location: [facility] ENDOSCOPY;  Service: Endoscopy;  Laterality: N/A;  . ESOPHAGOGASTRODUODENOSCOPY (EGD) WITH PROPOFOL N/A 06/05/2017   Procedure: ESOPHAGOGASTRODUODENOSCOPY (EGD) WITH PROPOFOL;  Surgeon: Lollie Sails, MD;  Location: [facility] ENDOSCOPY;  Service: Endoscopy;  Laterality: N/A;  . FACIAL COSMETIC SURGERY    . history of multiple colonic polyps    . lymph node removal from left groin    . OOPHORECTOMY    . VENTRAL HERNIA REPAIR N/A 10/02/2014   Procedure: HERNIA REPAIR VENTRAL ADULT;  Surgeon: Hillery Aldo  Tamala Julian, MD;  Location: [facility] ORS;  Service: General;  Laterality: N/A;  with mesh    FAMILY HISTORY :   Family History  Problem Relation Age of Onset  . Alcohol abuse Father   . Stroke Father   . Diabetes Sister   . Kidney cancer Sister   . Melanoma Brother   . Diabetes Brother   . Heart attack Brother   . Breast cancer Neg Hx   . Bladder Cancer Neg Hx   . Prostate cancer Neg Hx     SOCIAL HISTORY:   Social History   Tobacco Use  . Smoking status: Never Smoker  . Smokeless tobacco: Never Used  Substance Use Topics  .  Alcohol use: Yes    Alcohol/week: 0.6 oz    Types: 1 Cans of beer per week    Comment: rare social occasions only  . Drug use: No    ALLERGIES:  is allergic to no known allergies; buprenorphine hcl; and morphine and related.  MEDICATIONS:  Current Outpatient Medications  Medication Sig Dispense Refill  . ALBUTEROL IN Inhale 90 mcg into the lungs every 4 (four) hours as needed.    Marland Kitchen aspirin EC 81 MG tablet Take 81 mg by mouth.    . fluticasone (FLONASE) 50 MCG/ACT nasal spray 2 sprays by Each Nare route daily.    Marland Kitchen lisinopril (PRINIVIL,ZESTRIL) 10 MG tablet     . loperamide (IMODIUM A-D) 2 MG tablet Take 2 mg by mouth 4 (four) times daily as needed for diarrhea or loose stools.    Marland Kitchen loratadine (CLARITIN) 10 MG tablet Take 10 mg by mouth every morning.     Marland Kitchen omeprazole (PRILOSEC) 20 MG capsule      No current facility-administered medications for this visit.     PHYSICAL EXAMINATION: ECOG PERFORMANCE STATUS: 0 - Asymptomatic  Pulse 81   Temp 97.6 F (36.4 C)   Resp 16   Wt 183 lb 9.6 oz (83.3 kg)   BMI 33.58 kg/m   Filed Weights   08/22/17 1005 08/22/17 1006  Weight: 183 lb 3.2 oz (83.1 kg) 183 lb 9.6 oz (83.3 kg)    GENERAL: Well-nourished well-developed; Alert, no distress and comfortable.   Alone.  EYES: no pallor or icterus OROPHARYNX: no thrush or ulceration; good dentition  NECK: supple, no masses felt LYMPH:  no palpable lymphadenopathy in the cervical, axillary or inguinal regions LUNGS: clear to auscultation and  No wheeze or crackles HEART/CVS: regular rate & rhythm and no murmurs; No lower extremity edema ABDOMEN:abdomen soft, non-tender and normal bowel sounds Musculoskeletal:no cyanosis of digits and no clubbing  PSYCH: alert & oriented x 3 with fluent speech NEURO: no focal motor/sensory deficits SKIN:  no rashes or significant lesions  LABORATORY DATA:  I have reviewed the data as listed    Component Value Date/Time   NA 138 08/22/2017 0925   NA  141 03/03/2014 1022   K 3.9 08/22/2017 0925   K 4.1 03/03/2014 1022   CL 102 08/22/2017 0925   CL 105 03/03/2014 1022   CO2 26 08/22/2017 0925   CO2 28 03/03/2014 1022   GLUCOSE 118 (H) 08/22/2017 0925   GLUCOSE 112 (H) 03/03/2014 1022   BUN 19 08/22/2017 0925   BUN 18 03/03/2014 1022   CREATININE 0.86 08/22/2017 0925   CREATININE 0.94 03/03/2014 1022   CALCIUM 9.9 08/22/2017 0925   CALCIUM 9.4 03/03/2014 1022   PROT 7.8 08/22/2017 0925   PROT 7.4 03/03/2014 1022  ALBUMIN 4.7 08/22/2017 0925   ALBUMIN 4.1 03/03/2014 1022   AST 51 (H) 08/22/2017 0925   AST 29 03/03/2014 1022   ALT 55 (H) 08/22/2017 0925   ALT 53 03/03/2014 1022   ALKPHOS 67 08/22/2017 0925   ALKPHOS 81 03/03/2014 1022   BILITOT 0.9 08/22/2017 0925   BILITOT 0.4 03/03/2014 1022   GFRNONAA >60 08/22/2017 0925   GFRNONAA >60 03/03/2014 1022   GFRNONAA >60 09/02/2013 0939   GFRAA >60 08/22/2017 0925   GFRAA >60 03/03/2014 1022   GFRAA >60 09/02/2013 0939    No results found for: SPEP, UPEP  Lab Results  Component Value Date   WBC 6.7 08/22/2017   NEUTROABS 2.7 08/22/2017   HGB 15.6 08/22/2017   HCT 45.0 08/22/2017   MCV 90.0 08/22/2017   PLT 305 08/22/2017      Chemistry      Component Value Date/Time   NA 138 08/22/2017 0925   NA 141 03/03/2014 1022   K 3.9 08/22/2017 0925   K 4.1 03/03/2014 1022   CL 102 08/22/2017 0925   CL 105 03/03/2014 1022   CO2 26 08/22/2017 0925   CO2 28 03/03/2014 1022   BUN 19 08/22/2017 0925   BUN 18 03/03/2014 1022   CREATININE 0.86 08/22/2017 0925   CREATININE 0.94 03/03/2014 1022      Component Value Date/Time   CALCIUM 9.9 08/22/2017 0925   CALCIUM 9.4 03/03/2014 1022   ALKPHOS 67 08/22/2017 0925   ALKPHOS 81 03/03/2014 1022   AST 51 (H) 08/22/2017 0925   AST 29 03/03/2014 1022   ALT 55 (H) 08/22/2017 0925   ALT 53 03/03/2014 1022   BILITOT 0.9 08/22/2017 0925   BILITOT 0.4 03/03/2014 1022       RADIOGRAPHIC STUDIES: I have personally reviewed  the radiological images as listed and agreed with the findings in the report. No results found.   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 Most recent CT scan 01/17/17 which showed NED.   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  Discussed that there is no role for adjuvant therapy for early stage melanoma.  # Hepatic Steatosis - patient had previously had elevated LFTs with evidence of fatty liver on 01/17/17. LFTs today normal.   #Chronic headaches-not any new.  Question anxiety.  Improved with NSAIDs.  # rtc 6 mo with lab and follow-up with MD; CT scan prior.  Patient will inform us if any unexpected findings noted on the melanoma if any oncologic treatment is needed.   Orders Placed This Encounter  Procedures  . CT Abdomen Pelvis W Contrast    Standing Status:   Future    Standing Expiration Date:   08/22/2018    Order Specific Question:   If indicated for the ordered procedure, I authorize the administration of contrast media per Radiology protocol    Answer:   Yes    Order Specific Question:   Preferred imaging location?    Answer:    [facility]    Order Specific Question:   Is Oral Contrast requested for this exam?    Answer:   Per Radiology protocol    Order Specific Question:   Radiology Contrast Protocol - do NOT remove file  path    Answer:   [email or web address]   All questions were answered. The patient knows to call the clinic with any problems, questions or concerns.      Cammie Sickle, MD 08/24/2017 12:26 AM

## 2017-08-22 NOTE — Assessment & Plan Note (Addendum)
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 Most recent CT scan 01/17/17 which showed NED.   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  Discussed that there is no role for adjuvant therapy for early stage melanoma.  # Hepatic Steatosis - patient had previously had elevated LFTs with evidence of fatty liver on 01/17/17. LFTs today normal.   #Chronic headaches-not any new.  Question anxiety.  Improved with NSAIDs.  # rtc 6 mo with lab and follow-up with MD; CT scan prior.  Patient will inform us if any unexpected findings noted on the melanoma if any oncologic treatment is needed.

## 2017-10-22 DIAGNOSIS — D0330 Melanoma in situ of unspecified part of face: Secondary | ICD-10-CM | POA: Insufficient documentation

## 2017-12-17 ENCOUNTER — Other Ambulatory Visit: Payer: Self-pay | Admitting: Physician Assistant

## 2017-12-17 DIAGNOSIS — Z1231 Encounter for screening mammogram for malignant neoplasm of breast: Secondary | ICD-10-CM

## 2018-01-17 ENCOUNTER — Ambulatory Visit
Admission: RE | Admit: 2018-01-17 | Discharge: 2018-01-17 | Disposition: A | Discharge status: Home / Self Care | Payer: Medicare Other | Source: Ambulatory Visit | Attending: Physician Assistant | Admitting: Physician Assistant

## 2018-01-17 DIAGNOSIS — Z1231 Encounter for screening mammogram for malignant neoplasm of breast: Secondary | ICD-10-CM | POA: Diagnosis present

## 2018-01-17 HISTORY — DX: Malignant melanoma of skin, unspecified: C43.9

## 2018-02-18 ENCOUNTER — Ambulatory Visit
Admission: RE | Admit: 2018-02-18 | Discharge: 2018-02-18 | Disposition: A | Discharge status: Home / Self Care | Payer: Medicare Other | Source: Ambulatory Visit | Attending: Internal Medicine | Admitting: Internal Medicine

## 2018-02-18 DIAGNOSIS — K7689 Other specified diseases of liver: Secondary | ICD-10-CM | POA: Diagnosis not present

## 2018-02-18 DIAGNOSIS — C8203 Follicular lymphoma grade I, intra-abdominal lymph nodes: Secondary | ICD-10-CM | POA: Insufficient documentation

## 2018-02-18 HISTORY — DX: Essential (primary) hypertension: I10

## 2018-02-18 LAB — POCT I-STAT CREATININE: Creatinine, Ser: 0.9 mg/dL (ref 0.44–1.00)

## 2018-02-18 MED ORDER — IOPAMIDOL (ISOVUE-300) INJECTION 61%
100.0000 mL | Freq: Once | INTRAVENOUS | Status: AC | PRN
  Administered 2018-02-18: 100 mL via INTRAVENOUS

## 2018-02-20 ENCOUNTER — Inpatient Hospital Stay: Payer: Medicare Other | Attending: Internal Medicine

## 2018-02-20 ENCOUNTER — Other Ambulatory Visit: Payer: Self-pay | Admitting: Internal Medicine

## 2018-02-20 ENCOUNTER — Inpatient Hospital Stay (HOSPITAL_BASED_OUTPATIENT_CLINIC_OR_DEPARTMENT_OTHER): Payer: Medicare Other | Admitting: Oncology

## 2018-02-20 ENCOUNTER — Other Ambulatory Visit: Payer: Self-pay | Admitting: *Deleted

## 2018-02-20 VITALS — BP 142/85 | HR 95 | Temp 97.3°F | Resp 16 | Wt 186.0 lb

## 2018-02-20 DIAGNOSIS — Z86006 Personal history of melanoma in-situ: Secondary | ICD-10-CM | POA: Diagnosis not present

## 2018-02-20 DIAGNOSIS — C8203 Follicular lymphoma grade I, intra-abdominal lymph nodes: Secondary | ICD-10-CM

## 2018-02-20 DIAGNOSIS — Z9221 Personal history of antineoplastic chemotherapy: Secondary | ICD-10-CM | POA: Diagnosis not present

## 2018-02-20 DIAGNOSIS — C8208 Follicular lymphoma grade I, lymph nodes of multiple sites: Secondary | ICD-10-CM

## 2018-02-20 DIAGNOSIS — K760 Fatty (change of) liver, not elsewhere classified: Secondary | ICD-10-CM | POA: Insufficient documentation

## 2018-02-20 LAB — COMPREHENSIVE METABOLIC PANEL
ALT: 77 U/L — AB (ref 0–44)
AST: 58 U/L — AB (ref 15–41)
Albumin: 4.3 g/dL (ref 3.5–5.0)
Alkaline Phosphatase: 62 U/L (ref 38–126)
Anion gap: 7 (ref 5–15)
BUN: 17 mg/dL (ref 8–23)
CHLORIDE: 106 mmol/L (ref 98–111)
CO2: 26 mmol/L (ref 22–32)
CREATININE: 0.76 mg/dL (ref 0.44–1.00)
Calcium: 9.5 mg/dL (ref 8.9–10.3)
GFR calc Af Amer: >60 mL/min (ref >60)
GFR calc non Af Amer: >60 mL/min (ref >60)
Glucose, Bld: 108 mg/dL — ABNORMAL HIGH (ref 70–99)
POTASSIUM: 4.3 mmol/L (ref 3.5–5.1)
SODIUM: 139 mmol/L (ref 135–145)
Total Bilirubin: 0.7 mg/dL (ref 0.3–1.2)
Total Protein: 7.2 g/dL (ref 6.5–8.1)

## 2018-02-20 LAB — CBC WITH DIFFERENTIAL/PLATELET
ABS IMMATURE GRANULOCYTES: 0.02 10*3/uL (ref 0.00–0.07)
BASOS ABS: 0 10*3/uL (ref 0.0–0.1)
Basophils Relative: 1 %
EOS PCT: 1 %
Eosinophils Absolute: 0.1 10*3/uL (ref 0.0–0.5)
HCT: 41.8 % (ref 36.0–46.0)
HEMOGLOBIN: 14.4 g/dL (ref 12.0–15.0)
IMMATURE GRANULOCYTES: 0 %
LYMPHS ABS: 2.8 10*3/uL (ref 0.7–4.0)
LYMPHS PCT: 48 %
MCH: 30.3 pg (ref 26.0–34.0)
MCHC: 34.4 g/dL (ref 30.0–36.0)
MCV: 87.8 fL (ref 80.0–100.0)
Monocytes Absolute: 0.5 10*3/uL (ref 0.1–1.0)
Monocytes Relative: 9 %
NEUTROS ABS: 2.5 10*3/uL (ref 1.7–7.7)
NRBC: 0 % (ref 0.0–0.2)
Neutrophils Relative %: 41 %
Platelets: 264 10*3/uL (ref 150–400)
RBC: 4.76 MIL/uL (ref 3.87–5.11)
RDW: 12.8 % (ref 11.5–15.5)
WBC: 5.9 10*3/uL (ref 4.0–10.5)

## 2018-02-20 LAB — LACTATE DEHYDROGENASE: LDH: 132 U/L (ref 98–192)

## 2018-02-20 NOTE — Progress Notes (Signed)
[place] OFFICE PROGRESS NOTE  Patient Care Team: Marinda Elk, MD as PCP - General (Physician Assistant)  Cancer Staging No matching staging information was found for the patient.    Oncology History     1.FEB 2013-  MESENTERIC LYMPHADENOPATHY;] Pet positive lymphadenopathy.; . 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 3. Rituxan weekly x4 finished in March of 2013 4. PET scan in July of 2013 complete remission. 5. Maintenance Rituxan started in July of 2013;  x 2 years [finished 2015]  #April 2019-melanoma/left side of the nose [Dr. Nehemiah Massed   # LFTs- slightly up/ ? faty liver.   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   Patient had biopsy and removal of melanoma to left side of nose in June 2019 by Dr. Nehemiah Massed.  Subsequently area was frozen and she was prescribed imiquimod cream.  She was seen by Dr. Meda Coffee with facial plastic and reconstructive surgery also in June.  Had full-thickness skin graft to left nasal sidewall and rotation flap left nasal dorsum and left cheek.  She has healed well and she is pleased with the results.  Today, she denies any new lumps or bumps, weight loss or change in appetite.  She denies any nausea or vomiting, night sweats, fevers or chills.  Denies headaches.   REVIEW OF SYSTEMS:  Review of Systems  Constitutional: Negative.  Negative for chills, fever, malaise/fatigue and weight loss.  HENT: Negative for congestion, ear pain and tinnitus.   Eyes: Negative.  Negative for blurred vision and double vision.  Respiratory: Negative.  Negative for cough,  sputum production and shortness of breath.   Cardiovascular: Negative.  Negative for chest pain, palpitations and leg swelling.  Gastrointestinal: Negative.  Negative for abdominal pain, constipation, diarrhea, nausea and vomiting.  Genitourinary: Negative for dysuria, frequency and urgency.  Musculoskeletal: Negative for back pain and falls.  Skin: Positive for rash.       Recent plastic surgery (June 2019) to right forehead and nose- healed well.   Neurological: Negative.  Negative for weakness and headaches.  Endo/Heme/Allergies: Negative.  Does not bruise/bleed easily.  Psychiatric/Behavioral: Negative.  Negative for depression. The patient is not nervous/anxious and does not have insomnia.    PAST MEDICAL HISTORY :  Past Medical History:  Diagnosis Date  . Allergic rhinitis   . Allergy    seasonal  . Arthritis    degenerative of hip right  . Barrett esophagus   . Barrett esophagus   . Benign essential tremor   . Chronic bronchitis ([place])   . GERD (gastroesophageal reflux disease)   . 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 Hypertension   . Melanoma ([place])   . Migraine   . 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 ([place]) 09/13/2014  . Non Hodgkin's lymphoma ([place])   . Non Hodgkin's lymphoma ([place])   . RAD (reactive airway disease)   . Skin cancer     PAST SURGICAL HISTORY :   Past Surgical History:  Procedure Laterality Date  . ABDOMINAL HYSTERECTOMY    . APPENDECTOMY    .  BASAL CELL CARCINOMA EXCISION     on both sides of nose  . BREAST BIOPSY Left 12/2013   hyperplasia  . CHOLECYSTECTOMY    . COLONOSCOPY WITH PROPOFOL N/A 06/05/2017   Procedure: COLONOSCOPY WITH PROPOFOL;  Surgeon: Lollie Sails, MD;  Location[facility] ENDOSCOPY;  Service: Endoscopy;  Laterality: N/A;  . ESOPHAGOGASTRODUODENOSCOPY (EGD) WITH PROPOFOL N/A 03/30/2015   Procedure: ESOPHAGOGASTRODUODENOSCOPY (EGD) WITH PROPOFOL;  Surgeon: Lollie Sails, MD;  Location: [facility]  ENDOSCOPY;  Service: Endoscopy;  Laterality: N/A;  . ESOPHAGOGASTRODUODENOSCOPY (EGD) WITH PROPOFOL N/A 06/05/2017   Procedure: ESOPHAGOGASTRODUODENOSCOPY (EGD) WITH PROPOFOL;  Surgeon: Lollie Sails, MD;  Location: [facility] ENDOSCOPY;  Service: Endoscopy;  Laterality: N/A;  . FACIAL COSMETIC SURGERY    . history of multiple colonic polyps    . lymph node removal from left groin    . OOPHORECTOMY    . VENTRAL HERNIA REPAIR N/A 10/02/2014   Procedure: HERNIA REPAIR VENTRAL ADULT;  Surgeon: Leonie Green, MD;  Location: [facility] ORS;  Service: General;  Laterality: N/A;  with mesh    FAMILY HISTORY :   Family History  Problem Relation Age of Onset  . Alcohol abuse Father   . Stroke Father   . Diabetes Sister   . Kidney cancer Sister   . Melanoma Brother   . Diabetes Brother   . Heart attack Brother   . Breast cancer Neg Hx   . Bladder Cancer Neg Hx   . Prostate cancer Neg Hx     SOCIAL HISTORY:   Social History   Tobacco Use  . Smoking status: Never Smoker  . Smokeless tobacco: Never Used  Substance Use Topics  . Alcohol use: Yes    Alcohol/week: 1.0 standard drinks    Types: 1 Cans of beer per week    Comment: rare social occasions only  . Drug use: No    ALLERGIES:  is allergic to no known allergies; buprenorphine hcl; and morphine and related.  MEDICATIONS:  Current Outpatient Medications  Medication Sig Dispense Refill  . ALBUTEROL IN Inhale 90 mcg into the lungs every 4 (four) hours as needed.    Marland Kitchen aspirin EC 81 MG tablet Take 81 mg by mouth.    . fluticasone (FLONASE) 50 MCG/ACT nasal spray 2 sprays by Each Nare route daily.    Marland Kitchen lisinopril (PRINIVIL,ZESTRIL) 10 MG tablet     . loperamide (IMODIUM A-D) 2 MG tablet Take 2 mg by mouth 4 (four) times daily as needed for diarrhea or loose stools.    Marland Kitchen loratadine (CLARITIN) 10 MG tablet Take 10 mg by mouth every morning.     Marland Kitchen omeprazole (PRILOSEC) 20 MG capsule      No current facility-administered medications  for this visit.     PHYSICAL EXAMINATION: ECOG PERFORMANCE STATUS: 0 - Asymptomatic  BP (!) 142/85 (BP Location: Left Arm, Patient Position: Sitting)   Pulse 95   Temp (!) 97.3 F (36.3 C) (Tympanic)   Resp 16   Wt 186 lb (84.4 kg)   BMI 34.02 kg/m   Filed Weights   02/20/18 1113  Weight: 186 lb (84.4 kg)    Physical Exam  Constitutional: She is oriented to person, place, and time. Vital signs are normal. She appears well-developed and well-nourished.  HENT:  Head: Normocephalic and atraumatic.  Eyes: Pupils are equal, round, and reactive to light.  Neck: Normal range of motion.  Cardiovascular: Normal rate, regular rhythm and normal heart sounds.  No murmur heard. Pulmonary/Chest:  Effort normal and breath sounds normal. She has no wheezes.  Abdominal: Soft. Normal appearance and bowel sounds are normal. She exhibits no distension. There is no tenderness.  Musculoskeletal: Normal range of motion. She exhibits no edema.  Neurological: She is alert and oriented to person, place, and time.  Skin: Skin is warm and dry. No rash noted.  Psychiatric: Judgment normal.  Nursing note and vitals reviewed.  LABORATORY DATA:  I have reviewed the data as listed    Component Value Date/Time   NA 139 02/20/2018 1043   NA 141 03/03/2014 1022   K 4.3 02/20/2018 1043   K 4.1 03/03/2014 1022   CL 106 02/20/2018 1043   CL 105 03/03/2014 1022   CO2 26 02/20/2018 1043   CO2 28 03/03/2014 1022   GLUCOSE 108 (H) 02/20/2018 1043   GLUCOSE 112 (H) 03/03/2014 1022   BUN 17 02/20/2018 1043   BUN 18 03/03/2014 1022   CREATININE 0.76 02/20/2018 1043   CREATININE 0.94 03/03/2014 1022   CALCIUM 9.5 02/20/2018 1043   CALCIUM 9.4 03/03/2014 1022   PROT 7.2 02/20/2018 1043   PROT 7.4 03/03/2014 1022   ALBUMIN 4.3 02/20/2018 1043   ALBUMIN 4.1 03/03/2014 1022   AST 58 (H) 02/20/2018 1043   AST 29 03/03/2014 1022   ALT 77 (H) 02/20/2018 1043   ALT 53 03/03/2014 1022   ALKPHOS 62 02/20/2018  1043   ALKPHOS 81 03/03/2014 1022   BILITOT 0.7 02/20/2018 1043   BILITOT 0.4 03/03/2014 1022   GFRNONAA >60 02/20/2018 1043   GFRNONAA >60 03/03/2014 1022   GFRNONAA >60 09/02/2013 0939   GFRAA >60 02/20/2018 1043   GFRAA >60 03/03/2014 1022   GFRAA >60 09/02/2013 0939    No results found for: SPEP, UPEP  Lab Results  Component Value Date   WBC 5.9 02/20/2018   NEUTROABS 2.5 02/20/2018   HGB 14.4 02/20/2018   HCT 41.8 02/20/2018   MCV 87.8 02/20/2018   PLT 264 02/20/2018      Chemistry      Component Value Date/Time   NA 139 02/20/2018 1043   NA 141 03/03/2014 1022   K 4.3 02/20/2018 1043   K 4.1 03/03/2014 1022   CL 106 02/20/2018 1043   CL 105 03/03/2014 1022   CO2 26 02/20/2018 1043   CO2 28 03/03/2014 1022   BUN 17 02/20/2018 1043   BUN 18 03/03/2014 1022   CREATININE 0.76 02/20/2018 1043   CREATININE 0.94 03/03/2014 1022      Component Value Date/Time   CALCIUM 9.5 02/20/2018 1043   CALCIUM 9.4 03/03/2014 1022   ALKPHOS 62 02/20/2018 1043   ALKPHOS 81 03/03/2014 1022   AST 58 (H) 02/20/2018 1043   AST 29 03/03/2014 1022   ALT 77 (H) 02/20/2018 1043   ALT 53 03/03/2014 1022   BILITOT 0.7 02/20/2018 1043   BILITOT 0.4 03/03/2014 1022       RADIOGRAPHIC STUDIES: I have personally reviewed the radiological images as listed and agreed with the findings in the report. No results found.   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 Most recent CT scan  02/18/18 revealed stable exam no new or progressive findings.  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 Rogue Bussing, there is no role for adjuvant therapy for early stage melanoma.  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  She is status  post reconstructive surgery to left forehead and nose.   # Hepatic Steatosis -LFT's normal today.   # rtc 6 mo with lab and follow-up with MD; CT scan prior.   Orders Placed This Encounter  Procedures  . CT Abdomen Pelvis W Contrast    Standing Status:   Future    Standing Expiration Date:   02/20/2019    Order Specific Question:   ** REASON FOR EXAM (FREE TEXT)    Answer:   Follicular lymphoma    Order Specific Question:   If indicated for the ordered procedure, I authorize the administration of contrast media per Radiology protocol    Answer:   Yes    Order Specific Question:   Preferred imaging location?    Answer:   [facility]    Order Specific Question:   Is Oral Contrast requested for this exam?    Answer:   Yes, Per Radiology protocol    Order Specific Question:   Radiology Contrast Protocol - do NOT remove file path    Answer:   [email or web address]   All questions were answered. The patient knows to call the clinic with any problems, questions or concerns.   Greater than 50% was spent in counseling and coordination of care with this patient including but not limited to discussion of the relevant topics above (See A&P) including, but not limited to diagnosis and management of acute and chronic medical conditions.   CC: Dr. Jarrett Ables, NP 02/20/2018 3:36 PM

## 2018-02-20 NOTE — Assessment & Plan Note (Addendum)
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 Most recent CT scan  02/18/18 revealed stable exam no new or progressive findings.  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 Rogue Bussing, there is no role for adjuvant therapy for early stage melanoma.  Biopsy results revealed melanoma in situ which indicates superficial malignant melanocytes restricted to the epidermis.  She is status post reconstructive surgery to left forehead and nose.   # Hepatic Steatosis -LFT's normal today.   # rtc 6 mo with lab and follow-up with MD; CT scan prior.

## 2018-08-22 ENCOUNTER — Inpatient Hospital Stay: Payer: Medicare Other | Attending: Internal Medicine

## 2018-08-22 ENCOUNTER — Other Ambulatory Visit: Payer: Self-pay

## 2018-08-22 ENCOUNTER — Ambulatory Visit: Payer: Medicare Other

## 2018-08-22 ENCOUNTER — Ambulatory Visit
Admission: RE | Admit: 2018-08-22 | Discharge: 2018-08-22 | Disposition: A | Discharge status: Home / Self Care | Payer: Medicare Other | Source: Ambulatory Visit | Attending: Internal Medicine | Admitting: Internal Medicine

## 2018-08-22 DIAGNOSIS — K760 Fatty (change of) liver, not elsewhere classified: Secondary | ICD-10-CM | POA: Insufficient documentation

## 2018-08-22 DIAGNOSIS — Z85820 Personal history of malignant melanoma of skin: Secondary | ICD-10-CM | POA: Insufficient documentation

## 2018-08-22 DIAGNOSIS — C8203 Follicular lymphoma grade I, intra-abdominal lymph nodes: Secondary | ICD-10-CM | POA: Diagnosis present

## 2018-08-22 LAB — COMPREHENSIVE METABOLIC PANEL
ALT: 62 U/L — ABNORMAL HIGH (ref 0–44)
AST: 50 U/L — ABNORMAL HIGH (ref 15–41)
Albumin: 4.2 g/dL (ref 3.5–5.0)
Alkaline Phosphatase: 62 U/L (ref 38–126)
Anion gap: 6 (ref 5–15)
BUN: 16 mg/dL (ref 8–23)
CO2: 26 mmol/L (ref 22–32)
Calcium: 9.2 mg/dL (ref 8.9–10.3)
Chloride: 106 mmol/L (ref 98–111)
Creatinine, Ser: 1 mg/dL (ref 0.44–1.00)
GFR calc Af Amer: >60 mL/min (ref >60)
GFR calc non Af Amer: 55 mL/min — ABNORMAL LOW (ref >60)
Glucose, Bld: 155 mg/dL — ABNORMAL HIGH (ref 70–99)
Potassium: 4.1 mmol/L (ref 3.5–5.1)
Sodium: 138 mmol/L (ref 135–145)
Total Bilirubin: 0.7 mg/dL (ref 0.3–1.2)
Total Protein: 7.4 g/dL (ref 6.5–8.1)

## 2018-08-22 LAB — CBC WITH DIFFERENTIAL/PLATELET
Abs Immature Granulocytes: 0.03 10*3/uL (ref 0.00–0.07)
Basophils Absolute: 0 10*3/uL (ref 0.0–0.1)
Basophils Relative: 1 %
Eosinophils Absolute: 0.1 10*3/uL (ref 0.0–0.5)
Eosinophils Relative: 1 %
HCT: 41.8 % (ref 36.0–46.0)
Hemoglobin: 14.2 g/dL (ref 12.0–15.0)
Immature Granulocytes: 1 %
Lymphocytes Relative: 43 %
Lymphs Abs: 2.4 10*3/uL (ref 0.7–4.0)
MCH: 30.3 pg (ref 26.0–34.0)
MCHC: 34 g/dL (ref 30.0–36.0)
MCV: 89.1 fL (ref 80.0–100.0)
Monocytes Absolute: 0.5 10*3/uL (ref 0.1–1.0)
Monocytes Relative: 9 %
Neutro Abs: 2.4 10*3/uL (ref 1.7–7.7)
Neutrophils Relative %: 45 %
Platelets: 260 10*3/uL (ref 150–400)
RBC: 4.69 MIL/uL (ref 3.87–5.11)
RDW: 13.1 % (ref 11.5–15.5)
WBC: 5.4 10*3/uL (ref 4.0–10.5)
nRBC: 0 % (ref 0.0–0.2)

## 2018-08-22 LAB — LACTATE DEHYDROGENASE: LDH: 131 U/L (ref 98–192)

## 2018-08-22 MED ORDER — IOHEXOL 300 MG/ML  SOLN
75.0000 mL | Freq: Once | INTRAMUSCULAR | Status: AC | PRN
  Administered 2018-08-22: 09:00:00 75 mL via INTRAVENOUS

## 2018-08-23 ENCOUNTER — Telehealth: Payer: Self-pay | Admitting: *Deleted

## 2018-08-23 ENCOUNTER — Other Ambulatory Visit: Payer: Self-pay

## 2018-08-23 ENCOUNTER — Encounter: Payer: Self-pay | Admitting: Internal Medicine

## 2018-08-23 ENCOUNTER — Other Ambulatory Visit: Payer: Medicare Other

## 2018-08-23 ENCOUNTER — Inpatient Hospital Stay (HOSPITAL_BASED_OUTPATIENT_CLINIC_OR_DEPARTMENT_OTHER): Payer: Medicare Other | Admitting: Internal Medicine

## 2018-08-23 DIAGNOSIS — C8203 Follicular lymphoma grade I, intra-abdominal lymph nodes: Secondary | ICD-10-CM

## 2018-08-23 NOTE — Telephone Encounter (Signed)
Entered in error

## 2018-08-23 NOTE — Progress Notes (Signed)
Patient stated that she was doing well with no complaints. 

## 2018-08-23 NOTE — Assessment & Plan Note (Addendum)
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  Clinically stable.  CT scan April 2020-no obvious evidence of recurrence or progression.  #Malignant melanoma of the left side of the nose-as per dermatology.  # Hepatic Steatosis slightly abnormal LFTs.  Monitor for now.  # disposition-follow-up 6 m-MD /lab-CBC CMP [facility]- Dr.B

## 2018-08-23 NOTE — Progress Notes (Signed)
I connected with [name] on 08/27/18 at 10:45 AM EDT by telephone visit and verified that I am speaking with the correct person using two identifiers.  I discussed the limitations, risks, security and privacy concerns of performing an evaluation and management service by telemedicine and the availability of in-person appointments. I also discussed with the patient that there may be a patient responsible charge related to this service. The patient expressed understanding and agreed to proceed.    Other persons participating in the visit and their role in the encounter: None Patient's location: Home Provider's location: Office  Oncology History     1.FEB 2013-  MESENTERIC LYMPHADENOPATHY;] Pet positive lymphadenopathy.; . 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 3. Rituxan weekly x4 finished in March of 2013 4. PET scan in July of 2013 complete remission. 5. Maintenance Rituxan started in July of 2013;  x 2 years [finished 2015]  #April 2019-melanoma/left side of the nose [Dr. Nehemiah Massed   # LFTs- slightly up/ ? faty liver.   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  female with history of follicle lymphoma currently on surveillance.   Patient denies any unusual lumps or bumps.  Appetite is good with no weight loss.  No nausea no vomiting.  No abdominal pain.  No night sweats or fevers.  Observation/objective: Hemoglobin 14 AST ALT mildly elevated in the 40s and 50s.  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  Clinically stable.  CT scan April 2020-no obvious evidence of recurrence or progression.  #Malignant melanoma of the left side of the nose-as per dermatology.  # Hepatic Steatosis slightly abnormal LFTs.  Monitor for now.  # disposition-follow-up 6 m-MD /lab-CBC CMP [facility]- Dr.B    Follow-up instructions:  I discussed the assessment and treatment plan with the patient.  The patient was provided an opportunity to ask questions and all were answered.  The patient agreed with the plan and demonstrated understanding of instructions.  The patient was advised to call back or seek an in person evaluation if the symptoms worsen or if the condition fails to improve as anticipated.  I provided 12 minutes of non face-to-face telephone visit time during this encounter, and > 50% was spent counseling as documented under my assessment & plan.   Dr. Charlaine Dalton [place] at [facility] 08/27/2018 10:51 AM

## 2018-11-11 ENCOUNTER — Other Ambulatory Visit: Payer: Self-pay | Admitting: Surgery

## 2018-11-11 DIAGNOSIS — R1013 Epigastric pain: Secondary | ICD-10-CM

## 2018-11-14 ENCOUNTER — Ambulatory Visit
Admission: RE | Admit: 2018-11-14 | Discharge: 2018-11-14 | Disposition: A | Discharge status: Home / Self Care | Payer: Medicare Other | Source: Ambulatory Visit | Attending: Surgery | Admitting: Surgery

## 2018-11-14 ENCOUNTER — Other Ambulatory Visit: Payer: Self-pay

## 2018-11-14 DIAGNOSIS — R1013 Epigastric pain: Secondary | ICD-10-CM | POA: Insufficient documentation

## 2019-01-02 ENCOUNTER — Other Ambulatory Visit: Payer: Self-pay | Admitting: Physician Assistant

## 2019-01-02 DIAGNOSIS — Z1231 Encounter for screening mammogram for malignant neoplasm of breast: Secondary | ICD-10-CM

## 2019-02-05 ENCOUNTER — Ambulatory Visit
Admission: RE | Admit: 2019-02-05 | Discharge: 2019-02-05 | Disposition: A | Discharge status: Home / Self Care | Payer: Medicare Other | Source: Ambulatory Visit | Attending: Physician Assistant | Admitting: Physician Assistant

## 2019-02-05 DIAGNOSIS — Z1231 Encounter for screening mammogram for malignant neoplasm of breast: Secondary | ICD-10-CM | POA: Insufficient documentation

## 2019-02-19 ENCOUNTER — Encounter: Payer: Self-pay | Admitting: Internal Medicine

## 2019-02-19 ENCOUNTER — Other Ambulatory Visit: Payer: Self-pay

## 2019-02-19 NOTE — Progress Notes (Signed)
Pre-visit assessment completed prior to [place] appointment with Dr. Rogue Bussing on 02/20/2019. No concerns identified at this time.

## 2019-02-20 ENCOUNTER — Other Ambulatory Visit: Payer: Self-pay

## 2019-02-20 ENCOUNTER — Inpatient Hospital Stay: Payer: Medicare Other | Attending: Oncology

## 2019-02-20 ENCOUNTER — Other Ambulatory Visit: Payer: Self-pay | Admitting: *Deleted

## 2019-02-20 ENCOUNTER — Inpatient Hospital Stay (HOSPITAL_BASED_OUTPATIENT_CLINIC_OR_DEPARTMENT_OTHER): Payer: Medicare Other | Admitting: Internal Medicine

## 2019-02-20 VITALS — BP 140/80 | HR 97 | Temp 97.7°F | Resp 20 | Ht 62.0 in | Wt 187.0 lb

## 2019-02-20 DIAGNOSIS — N390 Urinary tract infection, site not specified: Secondary | ICD-10-CM

## 2019-02-20 DIAGNOSIS — Z841 Family history of disorders of kidney and ureter: Secondary | ICD-10-CM | POA: Insufficient documentation

## 2019-02-20 DIAGNOSIS — Z885 Allergy status to narcotic agent status: Secondary | ICD-10-CM | POA: Insufficient documentation

## 2019-02-20 DIAGNOSIS — Z90721 Acquired absence of ovaries, unilateral: Secondary | ICD-10-CM | POA: Insufficient documentation

## 2019-02-20 DIAGNOSIS — C8203 Follicular lymphoma grade I, intra-abdominal lymph nodes: Secondary | ICD-10-CM

## 2019-02-20 DIAGNOSIS — Z833 Family history of diabetes mellitus: Secondary | ICD-10-CM | POA: Insufficient documentation

## 2019-02-20 DIAGNOSIS — Z811 Family history of alcohol abuse and dependence: Secondary | ICD-10-CM | POA: Diagnosis not present

## 2019-02-20 DIAGNOSIS — Z823 Family history of stroke: Secondary | ICD-10-CM | POA: Diagnosis not present

## 2019-02-20 DIAGNOSIS — K760 Fatty (change of) liver, not elsewhere classified: Secondary | ICD-10-CM | POA: Diagnosis not present

## 2019-02-20 DIAGNOSIS — Z85828 Personal history of other malignant neoplasm of skin: Secondary | ICD-10-CM | POA: Diagnosis not present

## 2019-02-20 DIAGNOSIS — R300 Dysuria: Secondary | ICD-10-CM | POA: Insufficient documentation

## 2019-02-20 DIAGNOSIS — Z808 Family history of malignant neoplasm of other organs or systems: Secondary | ICD-10-CM | POA: Insufficient documentation

## 2019-02-20 DIAGNOSIS — Z8719 Personal history of other diseases of the digestive system: Secondary | ICD-10-CM | POA: Diagnosis not present

## 2019-02-20 DIAGNOSIS — Z8249 Family history of ischemic heart disease and other diseases of the circulatory system: Secondary | ICD-10-CM | POA: Diagnosis not present

## 2019-02-20 DIAGNOSIS — Z79899 Other long term (current) drug therapy: Secondary | ICD-10-CM | POA: Diagnosis not present

## 2019-02-20 DIAGNOSIS — R350 Frequency of micturition: Secondary | ICD-10-CM | POA: Diagnosis not present

## 2019-02-20 LAB — URINALYSIS, COMPLETE (UACMP) WITH MICROSCOPIC
Bilirubin Urine: NEGATIVE
Glucose, UA: NEGATIVE mg/dL
Ketones, ur: NEGATIVE mg/dL
Nitrite: NEGATIVE
Protein, ur: NEGATIVE mg/dL
Specific Gravity, Urine: 1.004 — ABNORMAL LOW (ref 1.005–1.030)
pH: 5 (ref 5.0–8.0)

## 2019-02-20 LAB — CBC WITH DIFFERENTIAL/PLATELET
Abs Immature Granulocytes: 0.04 10*3/uL (ref 0.00–0.07)
Basophils Absolute: 0 10*3/uL (ref 0.0–0.1)
Basophils Relative: 1 %
Eosinophils Absolute: 0.1 10*3/uL (ref 0.0–0.5)
Eosinophils Relative: 1 %
HCT: 42.1 % (ref 36.0–46.0)
Hemoglobin: 14.1 g/dL (ref 12.0–15.0)
Immature Granulocytes: 1 %
Lymphocytes Relative: 35 %
Lymphs Abs: 2.7 10*3/uL (ref 0.7–4.0)
MCH: 30.4 pg (ref 26.0–34.0)
MCHC: 33.5 g/dL (ref 30.0–36.0)
MCV: 90.7 fL (ref 80.0–100.0)
Monocytes Absolute: 0.6 10*3/uL (ref 0.1–1.0)
Monocytes Relative: 8 %
Neutro Abs: 4.2 10*3/uL (ref 1.7–7.7)
Neutrophils Relative %: 54 %
Platelets: 269 10*3/uL (ref 150–400)
RBC: 4.64 MIL/uL (ref 3.87–5.11)
RDW: 12.9 % (ref 11.5–15.5)
WBC: 7.6 10*3/uL (ref 4.0–10.5)
nRBC: 0 % (ref 0.0–0.2)

## 2019-02-20 LAB — COMPREHENSIVE METABOLIC PANEL
ALT: 111 U/L — ABNORMAL HIGH (ref 0–44)
AST: 99 U/L — ABNORMAL HIGH (ref 15–41)
Albumin: 4.2 g/dL (ref 3.5–5.0)
Alkaline Phosphatase: 56 U/L (ref 38–126)
Anion gap: 8 (ref 5–15)
BUN: 14 mg/dL (ref 8–23)
CO2: 25 mmol/L (ref 22–32)
Calcium: 9.4 mg/dL (ref 8.9–10.3)
Chloride: 105 mmol/L (ref 98–111)
Creatinine, Ser: 0.95 mg/dL (ref 0.44–1.00)
GFR calc Af Amer: >60 mL/min (ref >60)
GFR calc non Af Amer: 58 mL/min — ABNORMAL LOW (ref >60)
Glucose, Bld: 143 mg/dL — ABNORMAL HIGH (ref 70–99)
Potassium: 3.6 mmol/L (ref 3.5–5.1)
Sodium: 138 mmol/L (ref 135–145)
Total Bilirubin: 0.8 mg/dL (ref 0.3–1.2)
Total Protein: 7.3 g/dL (ref 6.5–8.1)

## 2019-02-20 NOTE — Progress Notes (Signed)
[place] OFFICE PROGRESS NOTE  Patient Care Team: Marinda Elk, MD as PCP - General (Physician Assistant)  Cancer Staging No matching staging information was found for the patient.    Oncology History Overview Note    1.FEB 2013-  MESENTERIC LYMPHADENOPATHY;] Pet positive lymphadenopathy.; . 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 3. Rituxan weekly x4 finished in March of 2013 4. PET scan in July of 2013 complete remission. 5. Maintenance Rituxan started in July of 2013;  x 2 years [finished 2015]  #April 2019-melanoma/left side of the nose [Dr. Nehemiah Massed   # LFTs- slightly up/ ? faty liver.   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  Patient denies any new lumps or bumps.  Appetite is good but no weight loss.  Noticed to have increased frequency of urination burning pain in the last 1 to 2 days.  No back pain.  No fevers.  REVIEW OF SYSTEMS:  Review of Systems  Constitutional: Negative.  Negative for chills, fever, malaise/fatigue and weight loss.  HENT: Negative for congestion, ear pain and tinnitus.   Eyes: Negative.  Negative for blurred vision and double vision.  Respiratory: Negative.  Negative for cough, sputum production and shortness of breath.   Cardiovascular: Negative.  Negative for chest pain, palpitations and leg swelling.  Gastrointestinal: Negative.  Negative for abdominal pain, constipation, diarrhea, nausea and vomiting.  Genitourinary: Positive for dysuria, frequency and urgency.  Musculoskeletal: Negative for back pain and falls.  Neurological:  Negative.  Negative for weakness and headaches.  Endo/Heme/Allergies: Negative.  Does not bruise/bleed easily.  Psychiatric/Behavioral: Negative.  Negative for depression. The patient is not nervous/anxious and does not have insomnia.    PAST MEDICAL HISTORY :  Past Medical History:  Diagnosis Date  . Allergic rhinitis   . Allergy    seasonal  . Arthritis    degenerative of hip right  . Barrett esophagus   . Barrett esophagus   . Benign essential tremor   . Chronic bronchitis ([place])   . GERD (gastroesophageal reflux disease)   . 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 Hypertension   . Melanoma ([place])   . Migraine   . 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 ([place]) 09/13/2014  . Non Hodgkin's lymphoma ([place])   . Non Hodgkin's lymphoma ([place])   . RAD (reactive airway disease)   . Skin cancer     PAST SURGICAL HISTORY :   Past Surgical History:  Procedure Laterality Date  . ABDOMINAL HYSTERECTOMY    . APPENDECTOMY    . BASAL CELL CARCINOMA EXCISION     on both sides of nose  . BREAST BIOPSY Left 12/2013   hyperplasia  . CHOLECYSTECTOMY    . COLONOSCOPY WITH PROPOFOL N/A 06/05/2017   Procedure: COLONOSCOPY WITH PROPOFOL;  Surgeon: Lollie Sails, MD;  Location: [facility] ENDOSCOPY;  Service: Endoscopy;  Laterality: N/A;  . ESOPHAGOGASTRODUODENOSCOPY (EGD) WITH PROPOFOL N/A 03/30/2015   Procedure: ESOPHAGOGASTRODUODENOSCOPY (EGD) WITH PROPOFOL;  Surgeon: Lollie Sails, MD;  Location: [facility] ENDOSCOPY;  Service: Endoscopy;  Laterality: N/A;  . ESOPHAGOGASTRODUODENOSCOPY (EGD) WITH PROPOFOL  N/A 06/05/2017   Procedure: ESOPHAGOGASTRODUODENOSCOPY (EGD) WITH PROPOFOL;  Surgeon: Lollie Sails, MD;  Location: [facility] ENDOSCOPY;  Service: Endoscopy;  Laterality: N/A;  . FACIAL COSMETIC SURGERY    . history of multiple colonic polyps    . lymph node removal from left groin    . OOPHORECTOMY    . VENTRAL HERNIA REPAIR N/A 10/02/2014   Procedure: HERNIA REPAIR  VENTRAL ADULT;  Surgeon: Leonie Green, MD;  Location: [facility] ORS;  Service: General;  Laterality: N/A;  with mesh    FAMILY HISTORY :   Family History  Problem Relation Age of Onset  . Alcohol abuse Father   . Stroke Father   . Diabetes Sister   . Kidney cancer Sister   . Melanoma Brother   . Diabetes Brother   . Heart attack Brother   . Breast cancer Neg Hx   . Bladder Cancer Neg Hx   . Prostate cancer Neg Hx     SOCIAL HISTORY:   Social History   Tobacco Use  . Smoking status: Never Smoker  . Smokeless tobacco: Never Used  Substance Use Topics  . Alcohol use: Yes    Alcohol/week: 1.0 standard drinks    Types: 1 Cans of beer per week    Comment: rare social occasions only  . Drug use: No    ALLERGIES:  is allergic to buprenorphine hcl and morphine and related.  MEDICATIONS:  Current Outpatient Medications  Medication Sig Dispense Refill  . aspirin EC 81 MG tablet Take 81 mg by mouth.    . Calcium Carbonate-Vit D-Min (CALCIUM 1200 PO) Take 1 tablet by mouth 1 day or 1 dose.    . Cholecalciferol (VITAMIN D3) 25 MCG (1000 UT) CAPS Take 1 capsule by mouth 1 day or 1 dose.    Marland Kitchen lisinopril (PRINIVIL,ZESTRIL) 10 MG tablet     . loperamide (IMODIUM A-D) 2 MG tablet Take 2 mg by mouth 4 (four) times daily as needed for diarrhea or loose stools.    Marland Kitchen loratadine (CLARITIN) 10 MG tablet Take 10 mg by mouth every morning.     Marland Kitchen omeprazole (PRILOSEC) 20 MG capsule      No current facility-administered medications for this visit.     PHYSICAL EXAMINATION: ECOG PERFORMANCE STATUS: 0 - Asymptomatic  BP 140/80   Pulse 97   Temp 97.7 F (36.5 C) (Tympanic)   Resp 20   Ht 5\' 2"  (1.575 m)   Wt 187 lb (84.8 kg)   BMI 34.20 kg/m   Filed Weights   02/20/19 0939  Weight: 187 lb (84.8 kg)    Physical Exam Vitals signs and nursing note reviewed.  Constitutional:      Appearance: Normal appearance. She is well-developed.  HENT:     Head: Normocephalic and atraumatic.   Eyes:     Pupils: Pupils are equal, round, and reactive to light.  Neck:     Musculoskeletal: Normal range of motion.  Cardiovascular:     Rate and Rhythm: Normal rate and regular rhythm.     Heart sounds: Normal heart sounds. No murmur.  Pulmonary:     Effort: Pulmonary effort is normal.     Breath sounds: Normal breath sounds. No wheezing.  Abdominal:     General: Bowel sounds are normal. There is no distension.     Palpations: Abdomen is soft.     Tenderness: There is no abdominal tenderness.  Musculoskeletal: Normal range of motion.  Skin:    General: Skin  is warm and dry.     Findings: No rash.  Neurological:     Mental Status: She is alert and oriented to person, place, and time.  Psychiatric:        Judgment: Judgment normal.    LABORATORY DATA:  I have reviewed the data as listed    Component Value Date/Time   NA 138 02/20/2019 0916   NA 141 03/03/2014 1022   K 3.6 02/20/2019 0916   K 4.1 03/03/2014 1022   CL 105 02/20/2019 0916   CL 105 03/03/2014 1022   CO2 25 02/20/2019 0916   CO2 28 03/03/2014 1022   GLUCOSE 143 (H) 02/20/2019 0916   GLUCOSE 112 (H) 03/03/2014 1022   BUN 14 02/20/2019 0916   BUN 18 03/03/2014 1022   CREATININE 0.95 02/20/2019 0916   CREATININE 0.94 03/03/2014 1022   CALCIUM 9.4 02/20/2019 0916   CALCIUM 9.4 03/03/2014 1022   PROT 7.3 02/20/2019 0916   PROT 7.4 03/03/2014 1022   ALBUMIN 4.2 02/20/2019 0916   ALBUMIN 4.1 03/03/2014 1022   AST 99 (H) 02/20/2019 0916   AST 29 03/03/2014 1022   ALT 111 (H) 02/20/2019 0916   ALT 53 03/03/2014 1022   ALKPHOS 56 02/20/2019 0916   ALKPHOS 81 03/03/2014 1022   BILITOT 0.8 02/20/2019 0916   BILITOT 0.4 03/03/2014 1022   GFRNONAA 58 (L) 02/20/2019 0916   GFRNONAA >60 03/03/2014 1022   GFRNONAA >60 09/02/2013 0939   GFRAA >60 02/20/2019 0916   GFRAA >60 03/03/2014 1022   GFRAA >60 09/02/2013 0939    No results found for: SPEP, UPEP  Lab Results  Component Value Date   WBC 7.6  02/20/2019   NEUTROABS 4.2 02/20/2019   HGB 14.1 02/20/2019   HCT 42.1 02/20/2019   MCV 90.7 02/20/2019   PLT 269 02/20/2019      Chemistry      Component Value Date/Time   NA 138 02/20/2019 0916   NA 141 03/03/2014 1022   K 3.6 02/20/2019 0916   K 4.1 03/03/2014 1022   CL 105 02/20/2019 0916   CL 105 03/03/2014 1022   CO2 25 02/20/2019 0916   CO2 28 03/03/2014 1022   BUN 14 02/20/2019 0916   BUN 18 03/03/2014 1022   CREATININE 0.95 02/20/2019 0916   CREATININE 0.94 03/03/2014 1022      Component Value Date/Time   CALCIUM 9.4 02/20/2019 0916   CALCIUM 9.4 03/03/2014 1022   ALKPHOS 56 02/20/2019 0916   ALKPHOS 81 03/03/2014 1022   AST 99 (H) 02/20/2019 0916   AST 29 03/03/2014 1022   ALT 111 (H) 02/20/2019 0916   ALT 53 03/03/2014 1022   BILITOT 0.8 02/20/2019 0916   BILITOT 0.4 03/03/2014 1022       RADIOGRAPHIC STUDIES: I have personally reviewed the radiological images as listed and agreed with the findings in the report. No results found.   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  Clinically stable.  CT scan April 2020-no obvious evidence of recurrence or progression.  Stable.  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  # UTI- UA/culture; frequent/Dr.Wolfe- recommend evaluation; reviewed previous March 2020 labial Bx- Dr.beasley-negative for malignancy.  # Hepatic Steatosis slightly abnormal LFTs-chronic follow-up with PCP  # Disposition: will call re: UTI [reluct with anti-biotcs] # referal to [facility]  Urology- Dr.Brandon re: frequent UTIs.  # -follow-up 6 m-MD /lab-CBC CMP LDH- Dr.B   Orders Placed This Encounter  Procedures  . CBC with Differential    Standing Status:   Future    Standing Expiration Date:   02/20/2020  . Comprehensive  metabolic panel    Standing Status:   Future    Standing Expiration Date:   02/20/2020  . Lactate dehydrogenase    Standing Status:   Future    Standing Expiration Date:   02/20/2020  . Ambulatory referral to Urology    Referral Priority:   Routine    Referral Type:   Consultation    Referral Reason:   Specialty Services Required    Referred to Provider:   Hollice Espy, MD    Requested Specialty:   Urology    Number of Visits Requested:   1   All questions were answered. The patient knows to call the clinic with any problems, questions or concerns.   Greater than 50% was spent in counseling and coordination of care with this patient including but not limited to discussion of the relevant topics above (See A&P) including, but not limited to diagnosis and management of acute and chronic medical conditions.   CC: Dr. Dallie Piles, MD 02/21/2019 6:56 AM

## 2019-02-20 NOTE — Assessment & Plan Note (Addendum)
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  Clinically stable.  CT scan April 2020-no obvious evidence of recurrence or progression.  Stable.  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  # UTI- UA/culture; frequent/Dr.Wolfe- recommend evaluation; reviewed previous March 2020 labial Bx- Dr.beasley-negative for malignancy.  # Hepatic Steatosis slightly abnormal LFTs-chronic follow-up with PCP  # Disposition: will call re: UTI [reluct with anti-biotcs] # referal to [facility] Urology- Dr.Brandon re: frequent UTIs.  # -follow-up 6 m-MD /lab-CBC CMP LDH- Dr.B

## 2019-02-21 ENCOUNTER — Other Ambulatory Visit: Payer: Self-pay | Admitting: Nurse Practitioner

## 2019-02-21 ENCOUNTER — Telehealth: Payer: Self-pay | Admitting: *Deleted

## 2019-02-21 LAB — URINE CULTURE

## 2019-02-21 MED ORDER — NITROFURANTOIN MONOHYD MACRO 100 MG PO CAPS: 100.0000 mg | ORAL_CAPSULE | Freq: Two times a day (BID) | ORAL | 0 refills | Status: AC

## 2019-02-21 MED ORDER — PHENAZOPYRIDINE HCL 200 MG PO TABS: 200.0000 mg | ORAL_TABLET | Freq: Three times a day (TID) | ORAL | 0 refills | Status: DC | PRN

## 2019-02-21 NOTE — Telephone Encounter (Signed)
Patient called (see note)

## 2019-02-21 NOTE — Telephone Encounter (Signed)
Lauren was to call patient to discuss her concerns.

## 2019-02-21 NOTE — Telephone Encounter (Signed)
Patient has called again asking for something for pain, stating she knows ahe has a UTI and the Culture results sre not back yet

## 2019-02-21 NOTE — Progress Notes (Signed)
Call patient to discuss urinary symptoms.  Symptomatic of UTI.  She says several recent UTIs diagnosed and treated through [facility].  Prior urine culture reviewed.  Urine culture from 02/20/2019 not yet available.  Recommend empiric treatment with nitrofurantoin based on prior culture and sensitivity and recent antibiotic use.  Given significant pain and discomfort, can start Pyridium along with antibiotics.  All this was discussed with patient.  Questions answered.  Prescription sent to pharmacy.

## 2019-02-21 NOTE — Telephone Encounter (Signed)
Patient called asking for results of UA and reporting that she is hurting really badly this morning and needs something for the pain. Please advise.  Urinalysis, Complete w Microscopic Order: [id number] Status:  Final result Visible to patient:  No (not released) Next appt:  08/21/2019 at 10:15 AM in Oncology ([facility]) Dx:  Dysuria  Ref Range & Units 1d ago (02/20/19) 3yr ago (05/23/17) 70yr ago (04/12/17) 60yr ago (04/12/17)  Color, Urine YELLOW STRAWAbnormal       APPearance CLEAR CLEARAbnormal       Specific Gravity, Urine 1.005 - 1.030 1.004Low       pH 5.0 - 8.0 5.0      Glucose, UA NEGATIVE mg/dL NEGATIVE  Negative R   Negative R   Hgb urine dipstick NEGATIVE SMALLAbnormal       Bilirubin Urine NEGATIVE NEGATIVE      Ketones, ur NEGATIVE mg/dL NEGATIVE      Protein, ur NEGATIVE mg/dL NEGATIVE      Nitrite NEGATIVE NEGATIVE      Leukocytes,Ua NEGATIVE MODERATEAbnormal       RBC / HPF 0 - 5 RBC/hpf 0-5      WBC, UA 0 - 5 WBC/hpf 11-20   11-30Abnormal  R    Bacteria, UA NONE SEEN RAREAbnormal    ModerateAbnormal  R    Squamous Epithelial / LPF 0 - 5 0-5      Mucus  PRESENT      Comment: Performed at [facility], [address] [place], [place] [address]  Resulting Agency  [facility] [place]      Specimen Collected: 02/20/19 09:50 Last Resulted: 02/20/19 10:31     Lab Flowsheet   Order Details   View Encounter   Lab and Collection Details   Routing   Result History     R=Reference range differs from displayed range      Other Results from 02/20/2019  Urine culture Order: [id number]  Status:  In process Visible to patient:  No (not released) Next appt:  08/21/2019 at 10:15 AM in Oncology ([facility]) Dx:  Dysuria Specimen Information: Urine         Specimen Collected: 02/20/19 09:50 Last Resulted: 02/20/19 13:12

## 2019-02-21 NOTE — Telephone Encounter (Signed)
Hassan Rowan, I rcvd the following msg from Dr. Jacinto Reap re: patient. Her culture is still pending.  Nesha Counihan-please follow-up on patient's urine culture; patient was not too keen on antibiotics if not needed. Please check with Jenny/Lauren-on the culture; and recommend antibiotics appropriately.

## 2019-02-27 ENCOUNTER — Telehealth: Payer: Self-pay | Admitting: Nurse Practitioner

## 2019-02-27 NOTE — Telephone Encounter (Signed)
calledp atient to follow up on UTI symptoms. She has completed antibiotics and symptoms has resolved. She has appt to see Dr. Erlene Quan in November at Dr. Aletha Halim recommendation.

## 2019-03-20 ENCOUNTER — Ambulatory Visit (INDEPENDENT_AMBULATORY_CARE_PROVIDER_SITE_OTHER): Payer: Medicare Other | Admitting: Urology

## 2019-03-20 ENCOUNTER — Encounter: Payer: Self-pay | Admitting: Urology

## 2019-03-20 ENCOUNTER — Other Ambulatory Visit: Payer: Self-pay

## 2019-03-20 VITALS — BP 174/92 | HR 90 | Ht 62.0 in | Wt 186.0 lb

## 2019-03-20 DIAGNOSIS — R300 Dysuria: Secondary | ICD-10-CM

## 2019-03-20 DIAGNOSIS — N3943 Post-void dribbling: Secondary | ICD-10-CM

## 2019-03-20 LAB — URINALYSIS, COMPLETE
Bilirubin, UA: NEGATIVE
Glucose, UA: NEGATIVE
Ketones, UA: NEGATIVE
Nitrite, UA: NEGATIVE
Protein,UA: NEGATIVE
RBC, UA: NEGATIVE
Specific Gravity, UA: 1.02 (ref 1.005–1.030)
Urobilinogen, Ur: 0.2 mg/dL (ref 0.2–1.0)
pH, UA: 5.5 (ref 5.0–7.5)

## 2019-03-20 LAB — MICROSCOPIC EXAMINATION
Bacteria, UA: NONE SEEN
RBC: NONE SEEN /hpf (ref 0–2)

## 2019-03-20 NOTE — Progress Notes (Signed)
03/20/2019 12:13 PM   [name] [birth year] [id number]  Referring provider: Cammie Sickle, MD [place],  [place] [address]  Chief Complaint  Patient presents with  . Recurrent UTI    HPI: 78 year old female who presents today for further evaluation of dysuria.  She reports that she has been struggling with dysuria for the past approximately a year.  She is been seen and evaluated by OB/GYN, Dr. Leafy Ro who noted a lesion on her labia.  This is biopsied and she was given a clobetasol taper.  Surgical pathology revealed a perivascular inflammatory infiltrate without dysplasia.  She initially felt improved after the clobetasol ointment but then after 1 time, she used it and felt which she describes is a urinary tract infection with dysuria urgency and frequency.  She felt that the clobetasol caused this so she threw it away and never use it again.  She did have a positive UA/urine culture on 11/20/2018 at which time she grew E. coli.  She reports that she has been treated on multiple occasions for urinary tract infection.  There is no documented evidence other than the aforementioned UTI that she is in fact had any urinary tract infections.  She did complain to Dr. Dr. Burlene Arnt that she was experiencing dysuria urgency and frequency.  She was treated with nitrofurantoin.  Urinalysis at the time was fairly unremarkable with 11-20 white blood cells, otherwise negative.  Rare bacteria were seen.  Urine culture was negative/multiple species present.  She was also given Pyridium to use as needed.  Today, she is asymptomatic.  Her urine is negative.  She does feel like she "stays wet" down there meaning she gets hot and sweats.  She tries not to wear underwear at nighttime for this reason.  During the daytime, she does wear peripad for occasional urinary leakage.  She reports that when she stands up after voiding, she will have some post void dribbling which is  bothersome to her thus she wears a pad for this.  No overt frequency urgency or urge incontinence.  She has seen Dr. Bernardo Heater for very similar complaints in both 2018 and 2019.  Work-up including CT abdomen pelvis with and without contrast in 2018 was negative.  She reports today that she feels more comfortable with a woman.   PMH: Past Medical History:  Diagnosis Date  . Allergic rhinitis   . Allergy    seasonal  . Arthritis    degenerative of hip right  . Barrett esophagus   . Barrett esophagus   . Benign essential tremor   . Chronic bronchitis ([place])   . GERD (gastroesophageal reflux disease)   . 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 Hypertension   . Melanoma ([place])   . Migraine   . 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 ([place]) 09/13/2014  . Non Hodgkin's lymphoma ([place])   . Non Hodgkin's lymphoma ([place])   . RAD (reactive airway disease)   . Skin cancer     Surgical History: Past Surgical History:  Procedure Laterality Date  . ABDOMINAL HYSTERECTOMY    . APPENDECTOMY    . BASAL CELL CARCINOMA EXCISION     on both sides of nose  . BREAST BIOPSY Left 12/2013   hyperplasia  . CHOLECYSTECTOMY    . COLONOSCOPY WITH PROPOFOL N/A 06/05/2017   Procedure: COLONOSCOPY WITH PROPOFOL;  Surgeon: Lollie Sails, MD;  Location: [facility] ENDOSCOPY;  Service: Endoscopy;  Laterality: N/A;  . ESOPHAGOGASTRODUODENOSCOPY (EGD) WITH PROPOFOL N/A 03/30/2015   Procedure: ESOPHAGOGASTRODUODENOSCOPY (EGD) WITH PROPOFOL;  Surgeon: Lollie Sails, MD;  Location: [facility] ENDOSCOPY;  Service: Endoscopy;  Laterality: N/A;  . ESOPHAGOGASTRODUODENOSCOPY (EGD) WITH PROPOFOL N/A 06/05/2017   Procedure: ESOPHAGOGASTRODUODENOSCOPY (EGD) WITH PROPOFOL;  Surgeon: Lollie Sails, MD;  Location: [facility] ENDOSCOPY;  Service: Endoscopy;  Laterality: N/A;  . FACIAL COSMETIC SURGERY    . history of multiple colonic polyps    . lymph node removal from left groin    . OOPHORECTOMY     . VENTRAL HERNIA REPAIR N/A 10/02/2014   Procedure: HERNIA REPAIR VENTRAL ADULT;  Surgeon: Leonie Green, MD;  Location: [facility] ORS;  Service: General;  Laterality: N/A;  with mesh    Home Medications:  Allergies as of 03/20/2019      Reactions   Buprenorphine Hcl Nausea And Vomiting   Morphine And Related Nausea And Vomiting      Medication List       Accurate as of March 20, 2019 12:13 PM. If you have any questions, ask your nurse or doctor.        STOP taking these medications   phenazopyridine 200 MG tablet Commonly known as: PYRIDIUM Stopped by: Hollice Espy, MD     TAKE these medications   aspirin EC 81 MG tablet Take 81 mg by mouth.   CALCIUM 1200 PO Take 1 tablet by mouth 1 day or 1 dose.   lisinopril 10 MG tablet Commonly known as: ZESTRIL   loperamide 2 MG tablet Commonly known as: IMODIUM A-D Take 2 mg by mouth 4 (four) times daily as needed for diarrhea or loose stools.   loratadine 10 MG tablet Commonly known as: CLARITIN Take 10 mg by mouth every morning.   omeprazole 20 MG capsule Commonly known as: PRILOSEC   Vitamin D3 25 MCG (1000 UT) Caps Take 1 capsule by mouth 1 day or 1 dose.       Allergies:  Allergies  Allergen Reactions  . Buprenorphine Hcl Nausea And Vomiting  . Morphine And Related Nausea And Vomiting    Family History: Family History  Problem Relation Age of Onset  . Alcohol abuse Father   . Stroke Father   . Diabetes Sister   . Kidney cancer Sister   . Melanoma Brother   . Diabetes Brother   . Heart attack Brother   . Breast cancer Neg Hx   . Bladder Cancer Neg Hx   . Prostate cancer Neg Hx     Social History:  reports that she has never smoked. She has never used smokeless tobacco. She reports current alcohol use of about 1.0 standard drinks of alcohol per week. She reports that she does not use drugs.  ROS: UROLOGY Frequent Urination?: No Hard to postpone urination?: No Burning/pain with urination?:  No Get up at night to urinate?: No Leakage of urine?: No Urine stream starts and stops?: No Trouble starting stream?: No Do you have to strain to urinate?: No Blood in urine?: No Urinary tract infection?: Yes Sexually transmitted disease?: No Injury to kidneys or bladder?: No Painful intercourse?: No Weak stream?: No Currently pregnant?: No Vaginal bleeding?: No Last menstrual period?: n  Gastrointestinal Nausea?: No Vomiting?: No Indigestion/heartburn?: No Diarrhea?: No Constipation?: No  Constitutional Fever: No Night sweats?: No Weight loss?: No Fatigue?: No  Skin Skin rash/lesions?: No Itching?: No  Eyes Blurred vision?: No Double vision?: No  Ears/Nose/Throat Sore throat?: No Sinus problems?: No  Hematologic/Lymphatic Swollen glands?: No Easy bruising?: No  Cardiovascular Leg swelling?: No Chest pain?: No  Respiratory Cough?: No Shortness of breath?: No  Endocrine Excessive thirst?: No  Musculoskeletal Back pain?: No Joint pain?: No  Neurological Headaches?: No Dizziness?: No  Psychologic Depression?: No Anxiety?: No  Physical Exam: BP (!) 174/92   Pulse 90   Ht 5\' 2"  (1.575 m)   Wt 186 lb (84.4 kg)   BMI 34.02 kg/m   Constitutional:  Alert and oriented, No acute distress. HEENT: [place] AT, moist mucus membranes.  Trachea midline, no masses. Cardiovascular: No clubbing, cyanosis, or edema. Respiratory: Normal respiratory effort, no increased work of breathing. Skin: No rashes, bruises or suspicious lesions. Neurologic: Grossly intact, no focal deficits, moving all 4 extremities. Psychiatric: Normal mood and affect.  Laboratory Data: Lab Results  Component Value Date   WBC 7.6 02/20/2019   HGB 14.1 02/20/2019   HCT 42.1 02/20/2019   MCV 90.7 02/20/2019   PLT 269 02/20/2019    Lab Results  Component Value Date   CREATININE 0.95 02/20/2019    Urinalysis UA today reviewed, negative see epic  Pertinent Imaging: CT scan  of the abdomen without contrast 11/2018 reviewed, no GU pathology.  Scan with contrast from 08/2018 does show small parapelvic cyst.  Bladder normal.  Assessment & Plan:    1. Dysuria History of intermittent dysuria, difficult to ascertain from her history whether or not this is related to external or internal irritation  She is only had 1 true documented infection in 11/2018.  Extremity recommended that the next time that she has symptoms which she feels are consistent with her UTI type symptoms, she should present for same-day evaluation with our PA for pelvic exam is and catheterized urinalysis.  This will help [place] rule in or rule out recurrent urinary tract infections or underlying cause.  Suspect based on her history of labial inflammation, is likely more external irritation.  She is agreeable this plan. - Urinalysis, Complete  2. Post-void dribbling Post void dribbling, discussed behavior modification data help with this  No overt OAB type symptoms   Return if symptoms worsen or fail to improve, for Same day visit with pelvic and Cath UA.  Hollice Espy, MD  [facility] [address], [place], [place] [address]  I spent 25 min with this patient of which greater than 50% was spent in counseling and coordination of care with the patient.  This included extensive review of records, previous biopsies, CT scans and laboratory data.  [phone]

## 2019-04-08 ENCOUNTER — Ambulatory Visit: Payer: Medicare Other | Admitting: Urology

## 2019-04-28 ENCOUNTER — Telehealth: Payer: Self-pay

## 2019-04-28 NOTE — Telephone Encounter (Signed)
Called patient to follow up on after hours nurse line which states she was having dysuria and dribbling. She states she is doing much better now and will call back if symptoms worsen again. Patient is currently Covid Positive-FYI

## 2019-08-21 ENCOUNTER — Inpatient Hospital Stay: Payer: Medicare Other | Attending: Internal Medicine

## 2019-08-21 ENCOUNTER — Other Ambulatory Visit: Payer: Self-pay

## 2019-08-21 ENCOUNTER — Inpatient Hospital Stay (HOSPITAL_BASED_OUTPATIENT_CLINIC_OR_DEPARTMENT_OTHER): Payer: Medicare Other | Admitting: Internal Medicine

## 2019-08-21 ENCOUNTER — Encounter: Payer: Self-pay | Admitting: Internal Medicine

## 2019-08-21 DIAGNOSIS — Z808 Family history of malignant neoplasm of other organs or systems: Secondary | ICD-10-CM | POA: Diagnosis not present

## 2019-08-21 DIAGNOSIS — C8200 Follicular lymphoma grade I, unspecified site: Secondary | ICD-10-CM | POA: Diagnosis not present

## 2019-08-21 DIAGNOSIS — Z841 Family history of disorders of kidney and ureter: Secondary | ICD-10-CM | POA: Insufficient documentation

## 2019-08-21 DIAGNOSIS — Z8249 Family history of ischemic heart disease and other diseases of the circulatory system: Secondary | ICD-10-CM | POA: Insufficient documentation

## 2019-08-21 DIAGNOSIS — M2550 Pain in unspecified joint: Secondary | ICD-10-CM | POA: Diagnosis not present

## 2019-08-21 DIAGNOSIS — Z85828 Personal history of other malignant neoplasm of skin: Secondary | ICD-10-CM | POA: Diagnosis not present

## 2019-08-21 DIAGNOSIS — Z823 Family history of stroke: Secondary | ICD-10-CM | POA: Diagnosis not present

## 2019-08-21 DIAGNOSIS — R7989 Other specified abnormal findings of blood chemistry: Secondary | ICD-10-CM | POA: Insufficient documentation

## 2019-08-21 DIAGNOSIS — Z833 Family history of diabetes mellitus: Secondary | ICD-10-CM | POA: Insufficient documentation

## 2019-08-21 DIAGNOSIS — C8203 Follicular lymphoma grade I, intra-abdominal lymph nodes: Secondary | ICD-10-CM

## 2019-08-21 DIAGNOSIS — R590 Localized enlarged lymph nodes: Secondary | ICD-10-CM | POA: Diagnosis not present

## 2019-08-21 DIAGNOSIS — Z811 Family history of alcohol abuse and dependence: Secondary | ICD-10-CM | POA: Diagnosis not present

## 2019-08-21 DIAGNOSIS — M549 Dorsalgia, unspecified: Secondary | ICD-10-CM | POA: Diagnosis not present

## 2019-08-21 DIAGNOSIS — Z79899 Other long term (current) drug therapy: Secondary | ICD-10-CM | POA: Diagnosis not present

## 2019-08-21 DIAGNOSIS — Z885 Allergy status to narcotic agent status: Secondary | ICD-10-CM | POA: Diagnosis not present

## 2019-08-21 DIAGNOSIS — Z8719 Personal history of other diseases of the digestive system: Secondary | ICD-10-CM | POA: Insufficient documentation

## 2019-08-21 DIAGNOSIS — Z90721 Acquired absence of ovaries, unilateral: Secondary | ICD-10-CM | POA: Insufficient documentation

## 2019-08-21 DIAGNOSIS — K760 Fatty (change of) liver, not elsewhere classified: Secondary | ICD-10-CM | POA: Insufficient documentation

## 2019-08-21 LAB — CBC WITH DIFFERENTIAL/PLATELET
Abs Immature Granulocytes: 0.02 10*3/uL (ref 0.00–0.07)
Basophils Absolute: 0 10*3/uL (ref 0.0–0.1)
Basophils Relative: 0 %
Eosinophils Absolute: 0.1 10*3/uL (ref 0.0–0.5)
Eosinophils Relative: 1 %
HCT: 42.4 % (ref 36.0–46.0)
Hemoglobin: 14.7 g/dL (ref 12.0–15.0)
Immature Granulocytes: 0 %
Lymphocytes Relative: 48 %
Lymphs Abs: 3.2 10*3/uL (ref 0.7–4.0)
MCH: 31.4 pg (ref 26.0–34.0)
MCHC: 34.7 g/dL (ref 30.0–36.0)
MCV: 90.6 fL (ref 80.0–100.0)
Monocytes Absolute: 0.6 10*3/uL (ref 0.1–1.0)
Monocytes Relative: 8 %
Neutro Abs: 2.9 10*3/uL (ref 1.7–7.7)
Neutrophils Relative %: 43 %
Platelets: 231 10*3/uL (ref 150–400)
RBC: 4.68 MIL/uL (ref 3.87–5.11)
RDW: 13.4 % (ref 11.5–15.5)
WBC: 6.8 10*3/uL (ref 4.0–10.5)
nRBC: 0 % (ref 0.0–0.2)

## 2019-08-21 LAB — COMPREHENSIVE METABOLIC PANEL
ALT: 139 U/L — ABNORMAL HIGH (ref 0–44)
AST: 114 U/L — ABNORMAL HIGH (ref 15–41)
Albumin: 4.1 g/dL (ref 3.5–5.0)
Alkaline Phosphatase: 63 U/L (ref 38–126)
Anion gap: 10 (ref 5–15)
BUN: 18 mg/dL (ref 8–23)
CO2: 25 mmol/L (ref 22–32)
Calcium: 9.6 mg/dL (ref 8.9–10.3)
Chloride: 105 mmol/L (ref 98–111)
Creatinine, Ser: 0.89 mg/dL (ref 0.44–1.00)
GFR calc Af Amer: >60 mL/min (ref >60)
GFR calc non Af Amer: >60 mL/min (ref >60)
Glucose, Bld: 109 mg/dL — ABNORMAL HIGH (ref 70–99)
Potassium: 3.9 mmol/L (ref 3.5–5.1)
Sodium: 140 mmol/L (ref 135–145)
Total Bilirubin: 0.5 mg/dL (ref 0.3–1.2)
Total Protein: 7.3 g/dL (ref 6.5–8.1)

## 2019-08-21 LAB — LACTATE DEHYDROGENASE: LDH: 166 U/L (ref 98–192)

## 2019-08-21 NOTE — Assessment & Plan Note (Addendum)
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  Clinically stable.  CT scan July 2020-negative for any lymphadenopathy.  Discussed with patient that further lymphomas are usually incurable; however very treatable.  I would recommend holding CT scans for now; continue clinical surveillance.  Patient recommend.  # Malignant melanoma- L shoulder/the nose-stable as per dermatology.  # Hepatic Steatosis-2-3 times upper limit LFTs.  Reviewed the [facility] work-up with the patient NEG hep work up.  I wholeheartedly agree with recommendations from GI regarding weight loss and alcohol cessation.  Also reviewed that CT scan July 2020 showed hepatic steatosis.  Awaiting ultrasound.  # # I discussed regarding Covid-19 precautions.  I reviewed the vaccine effectiveness and potential side effects in detail.  Also discussed long-term effectiveness and safety profile are unclear at this time.  I discussed December, 2020 ASCO position statement-that all patients are recommended COVID-19 vaccinations [when available]-as long as they do not have allergy to components of the vaccine.  However, I think the benefits of the vaccination outweigh the potential risks. Re: [id number] vaccination.    # Disposition:  # -follow-up 6 m-MD /lab-CBC CMP [facility]- Dr.B

## 2019-08-21 NOTE — Progress Notes (Signed)
[place] OFFICE PROGRESS NOTE  Patient Care Team: Marinda Elk, MD as PCP - General (Physician Assistant)  Cancer Staging No matching staging information was found for the patient.    Oncology History Overview Note    1.FEB 2013-  MESENTERIC LYMPHADENOPATHY;] Pet positive lymphadenopathy.; . 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 3. Rituxan weekly x4 finished in March of 2013 4. PET scan in July of 2013 complete remission. 5. Maintenance Rituxan started in July of 2013;  x 2 years [finished 2015]  #April 2019-melanoma/left side of the nose/ Left arm [Dr. Nehemiah Massed   # LFTs- slightly up/ ? faty liver.   6.Abnormal mammogram in August of 2015 biopsy has been reported to be negative for malignanc   Lymphoma, non-Hodgkin's ([place])  06/02/2014 Initial Diagnosis   Lymphoma, non-Hodgkin's   Follicular lymphoma grade I of intra-abdominal lymph nodes (HCC)     INTERVAL HISTORY:  79 year old patient above history for follow-up of lymphoma status post Rituxan maintenance was completed in 2015 is here for follow-up.  In the interim she was evaluated by GI for elevated LFTs-recommended alcohol cessation; weight loss.  Patient has been abstinent/beer for last 1 week.  She also awaiting ultrasound.  Patient states that she was recently evaluated by Dr. Evorn Gong had melanoma taken off her left arm.  Otherwise she denies any lumps or bumps.  Appetite is good.  No weight loss.   REVIEW OF SYSTEMS:  Review of Systems  Constitutional: Negative.  Negative for chills, fever, malaise/fatigue and weight loss.  HENT: Negative for congestion, ear pain and tinnitus.   Eyes: Negative.  Negative for blurred vision and double vision.  Respiratory: Negative.  Negative for cough, sputum production and shortness of breath.   Cardiovascular: Negative.  Negative for chest pain, palpitations and leg swelling.  Gastrointestinal: Negative.  Negative for abdominal pain,  constipation, diarrhea, nausea and vomiting.  Musculoskeletal: Positive for back pain and joint pain. Negative for falls.  Neurological: Negative.  Negative for weakness and headaches.  Endo/Heme/Allergies: Negative.  Does not bruise/bleed easily.  Psychiatric/Behavioral: Negative.  Negative for depression. The patient is not nervous/anxious and does not have insomnia.    PAST MEDICAL HISTORY :  Past Medical History:  Diagnosis Date  . Allergic rhinitis   . Allergy    seasonal  . Arthritis    degenerative of hip right  . Barrett esophagus   . Barrett esophagus   . Benign essential tremor   . Chronic bronchitis ([place])   . GERD (gastroesophageal reflux disease)   . 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 Hypertension   . Melanoma ([place])   . Migraine   . 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 ([place]) 09/13/2014  . Non Hodgkin's lymphoma ([place])   . Non Hodgkin's lymphoma ([place])   . RAD (reactive airway disease)   . Skin cancer     PAST SURGICAL HISTORY :   Past Surgical History:  Procedure Laterality Date  . ABDOMINAL HYSTERECTOMY    . APPENDECTOMY    . BASAL CELL CARCINOMA EXCISION     on both sides of nose  . BREAST BIOPSY Left 12/2013   hyperplasia  . CHOLECYSTECTOMY    . COLONOSCOPY WITH PROPOFOL N/A 06/05/2017   Procedure: COLONOSCOPY WITH PROPOFOL;  Surgeon: Lollie Sails, MD;  Location: [facility] ENDOSCOPY;  Service: Endoscopy;  Laterality: N/A;  . ESOPHAGOGASTRODUODENOSCOPY (EGD) WITH PROPOFOL N/A 03/30/2015   Procedure: ESOPHAGOGASTRODUODENOSCOPY (EGD) WITH  PROPOFOL;  Surgeon: Lollie Sails, MD;  Location: [facility] ENDOSCOPY;  Service: Endoscopy;  Laterality: N/A;  . ESOPHAGOGASTRODUODENOSCOPY (EGD) WITH PROPOFOL N/A 06/05/2017   Procedure: ESOPHAGOGASTRODUODENOSCOPY (EGD) WITH PROPOFOL;  Surgeon: Lollie Sails, MD;  Location: [facility] ENDOSCOPY;  Service: Endoscopy;  Laterality: N/A;  . FACIAL COSMETIC SURGERY    . history of multiple  colonic polyps    . lymph node removal from left groin    . OOPHORECTOMY    . VENTRAL HERNIA REPAIR N/A 10/02/2014   Procedure: HERNIA REPAIR VENTRAL ADULT;  Surgeon: Leonie Green, MD;  Location: [facility] ORS;  Service: General;  Laterality: N/A;  with mesh    FAMILY HISTORY :   Family History  Problem Relation Age of Onset  . Alcohol abuse Father   . Stroke Father   . Diabetes Sister   . Kidney cancer Sister   . Melanoma Brother   . Diabetes Brother   . Heart attack Brother   . Breast cancer Neg Hx   . Bladder Cancer Neg Hx   . Prostate cancer Neg Hx     SOCIAL HISTORY:   Social History   Tobacco Use  . Smoking status: Never Smoker  . Smokeless tobacco: Never Used  Substance Use Topics  . Alcohol use: Yes    Alcohol/week: 1.0 standard drinks    Types: 1 Cans of beer per week    Comment: rare social occasions only  . Drug use: No    ALLERGIES:  is allergic to buprenorphine hcl and morphine and related.  MEDICATIONS:  Current Outpatient Medications  Medication Sig Dispense Refill  . aspirin EC 81 MG tablet Take 81 mg by mouth.    . Calcium Carbonate-Vit D-Min (CALCIUM 1200 PO) Take 1 tablet by mouth 1 day or 1 dose.    . Cholecalciferol (VITAMIN D3) 25 MCG (1000 UT) CAPS Take 1 capsule by mouth 1 day or 1 dose.    Marland Kitchen lisinopril (PRINIVIL,ZESTRIL) 10 MG tablet     . loperamide (IMODIUM A-D) 2 MG tablet Take 2 mg by mouth 4 (four) times daily as needed for diarrhea or loose stools.    Marland Kitchen loratadine (CLARITIN) 10 MG tablet Take 10 mg by mouth every morning.     Marland Kitchen omeprazole (PRILOSEC) 20 MG capsule      No current facility-administered medications for this visit.    PHYSICAL EXAMINATION: ECOG PERFORMANCE STATUS: 0 - Asymptomatic  BP (!) 141/84 (BP Location: Left Arm, Patient Position: Sitting, Cuff Size: Normal)   Pulse 81   Temp (!) 97.4 F (36.3 C) (Tympanic)   Wt 181 lb (82.1 kg)   BMI 33.11 kg/m   Filed Weights   08/21/19 1043  Weight: 181 lb (82.1 kg)    Physical Exam  Constitutional: She is oriented to person, place, and time and well-developed, well-nourished, and in no distress.  Obese.  Walk independently.  HENT:  Head: Normocephalic and atraumatic.  Mouth/Throat: Oropharynx is clear and moist. No oropharyngeal exudate.  Eyes: Pupils are equal, round, and reactive to light.  Cardiovascular: Normal rate and regular rhythm.  Pulmonary/Chest: Effort normal and breath sounds normal. No respiratory distress. She has no wheezes.  Abdominal: Soft. Bowel sounds are normal. She exhibits no distension and no mass. There is no abdominal tenderness. There is no rebound and no guarding.  Musculoskeletal:        General: No tenderness or edema. Normal range of motion.     Cervical back: Normal range of motion and  neck supple.  Neurological: She is alert and oriented to person, place, and time.  Skin: Skin is warm.  Psychiatric: Affect normal.   LABORATORY DATA:  I have reviewed the data as listed    Component Value Date/Time   NA 140 08/21/2019 1031   NA 141 03/03/2014 1022   K 3.9 08/21/2019 1031   K 4.1 03/03/2014 1022   CL 105 08/21/2019 1031   CL 105 03/03/2014 1022   CO2 25 08/21/2019 1031   CO2 28 03/03/2014 1022   GLUCOSE 109 (H) 08/21/2019 1031   GLUCOSE 112 (H) 03/03/2014 1022   BUN 18 08/21/2019 1031   BUN 18 03/03/2014 1022   CREATININE 0.89 08/21/2019 1031   CREATININE 0.94 03/03/2014 1022   CALCIUM 9.6 08/21/2019 1031   CALCIUM 9.4 03/03/2014 1022   PROT 7.3 08/21/2019 1031   PROT 7.4 03/03/2014 1022   ALBUMIN 4.1 08/21/2019 1031   ALBUMIN 4.1 03/03/2014 1022   AST 114 (H) 08/21/2019 1031   AST 29 03/03/2014 1022   ALT 139 (H) 08/21/2019 1031   ALT 53 03/03/2014 1022   ALKPHOS 63 08/21/2019 1031   ALKPHOS 81 03/03/2014 1022   BILITOT 0.5 08/21/2019 1031   BILITOT 0.4 03/03/2014 1022   GFRNONAA >60 08/21/2019 1031   GFRNONAA >60 03/03/2014 1022   GFRNONAA >60 09/02/2013 0939   GFRAA >60 08/21/2019 1031    GFRAA >60 03/03/2014 1022   GFRAA >60 09/02/2013 0939    No results found for: SPEP, UPEP  Lab Results  Component Value Date   WBC 6.8 08/21/2019   NEUTROABS 2.9 08/21/2019   HGB 14.7 08/21/2019   HCT 42.4 08/21/2019   MCV 90.6 08/21/2019   PLT 231 08/21/2019      Chemistry      Component Value Date/Time   NA 140 08/21/2019 1031   NA 141 03/03/2014 1022   K 3.9 08/21/2019 1031   K 4.1 03/03/2014 1022   CL 105 08/21/2019 1031   CL 105 03/03/2014 1022   CO2 25 08/21/2019 1031   CO2 28 03/03/2014 1022   BUN 18 08/21/2019 1031   BUN 18 03/03/2014 1022   CREATININE 0.89 08/21/2019 1031   CREATININE 0.94 03/03/2014 1022      Component Value Date/Time   CALCIUM 9.6 08/21/2019 1031   CALCIUM 9.4 03/03/2014 1022   ALKPHOS 63 08/21/2019 1031   ALKPHOS 81 03/03/2014 1022   AST 114 (H) 08/21/2019 1031   AST 29 03/03/2014 1022   ALT 139 (H) 08/21/2019 1031   ALT 53 03/03/2014 1022   BILITOT 0.5 08/21/2019 1031   BILITOT 0.4 03/03/2014 1022       RADIOGRAPHIC STUDIES: I have personally reviewed the radiological images as listed and agreed with the findings in the report. No results found.   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  Clinically stable.  CT scan July 2020-negative for any lymphadenopathy.  Discussed with patient that further lymphomas are usually incurable; however very treatable.  I would recommend holding CT scans for now; continue clinical surveillance.  Patient recommend.  # Malignant melanoma- L shoulder/the nose-stable as per dermatology.  # Hepatic Steatosis-2-3 times upper limit LFTs.  Reviewed the [facility] work-up with the patient NEG hep work up.  I wholeheartedly agree with recommendations from GI regarding weight loss and alcohol cessation.  Also reviewed that CT scan  July  2020 showed hepatic steatosis.  Awaiting ultrasound.  # # I discussed regarding Covid-19 precautions.  I reviewed the vaccine effectiveness and potential side effects in detail.  Also discussed long-term effectiveness and safety profile are unclear at this time.  I discussed December, 2020 ASCO position statement-that all patients are recommended COVID-19 vaccinations [when available]-as long as they do not have allergy to components of the vaccine.  However, I think the benefits of the vaccination outweigh the potential risks. Re: [id number] vaccination.    # Disposition:  # -follow-up 6 m-MD /lab-CBC CMP [facility]- Dr.B   Orders Placed This Encounter  Procedures  . CBC with Differential    Standing Status:   Future    Standing Expiration Date:   08/20/2020  . Comprehensive metabolic panel    Standing Status:   Future    Standing Expiration Date:   08/20/2020  . Lactate dehydrogenase    Standing Status:   Future    Standing Expiration Date:   08/20/2020     Cammie Sickle, MD 08/21/2019 11:49 AM

## 2019-08-22 ENCOUNTER — Other Ambulatory Visit: Payer: Self-pay | Admitting: Gastroenterology

## 2019-08-22 DIAGNOSIS — R748 Abnormal levels of other serum enzymes: Secondary | ICD-10-CM

## 2019-09-03 ENCOUNTER — Other Ambulatory Visit: Payer: Self-pay

## 2019-09-03 ENCOUNTER — Ambulatory Visit
Admission: RE | Admit: 2019-09-03 | Discharge: 2019-09-03 | Disposition: A | Discharge status: Home / Self Care | Payer: Medicare Other | Source: Ambulatory Visit | Attending: Gastroenterology | Admitting: Gastroenterology

## 2019-09-03 DIAGNOSIS — R748 Abnormal levels of other serum enzymes: Secondary | ICD-10-CM | POA: Diagnosis not present

## 2020-02-19 ENCOUNTER — Other Ambulatory Visit: Payer: Self-pay

## 2020-02-19 ENCOUNTER — Encounter: Payer: Self-pay | Admitting: Internal Medicine

## 2020-02-19 ENCOUNTER — Inpatient Hospital Stay: Payer: Medicare Other | Attending: Internal Medicine

## 2020-02-19 ENCOUNTER — Inpatient Hospital Stay (HOSPITAL_BASED_OUTPATIENT_CLINIC_OR_DEPARTMENT_OTHER): Payer: Medicare Other | Admitting: Internal Medicine

## 2020-02-19 DIAGNOSIS — Z811 Family history of alcohol abuse and dependence: Secondary | ICD-10-CM | POA: Diagnosis not present

## 2020-02-19 DIAGNOSIS — Z885 Allergy status to narcotic agent status: Secondary | ICD-10-CM | POA: Diagnosis not present

## 2020-02-19 DIAGNOSIS — Z79899 Other long term (current) drug therapy: Secondary | ICD-10-CM | POA: Insufficient documentation

## 2020-02-19 DIAGNOSIS — Z808 Family history of malignant neoplasm of other organs or systems: Secondary | ICD-10-CM | POA: Diagnosis not present

## 2020-02-19 DIAGNOSIS — Z8249 Family history of ischemic heart disease and other diseases of the circulatory system: Secondary | ICD-10-CM | POA: Insufficient documentation

## 2020-02-19 DIAGNOSIS — Z85828 Personal history of other malignant neoplasm of skin: Secondary | ICD-10-CM | POA: Diagnosis not present

## 2020-02-19 DIAGNOSIS — Z9049 Acquired absence of other specified parts of digestive tract: Secondary | ICD-10-CM | POA: Insufficient documentation

## 2020-02-19 DIAGNOSIS — Z833 Family history of diabetes mellitus: Secondary | ICD-10-CM | POA: Insufficient documentation

## 2020-02-19 DIAGNOSIS — D8489 Other immunodeficiencies: Secondary | ICD-10-CM | POA: Diagnosis not present

## 2020-02-19 DIAGNOSIS — K760 Fatty (change of) liver, not elsewhere classified: Secondary | ICD-10-CM | POA: Diagnosis not present

## 2020-02-19 DIAGNOSIS — Z8051 Family history of malignant neoplasm of kidney: Secondary | ICD-10-CM | POA: Insufficient documentation

## 2020-02-19 DIAGNOSIS — Z6836 Body mass index (BMI) 36.0-36.9, adult: Secondary | ICD-10-CM | POA: Insufficient documentation

## 2020-02-19 DIAGNOSIS — Z8719 Personal history of other diseases of the digestive system: Secondary | ICD-10-CM | POA: Diagnosis not present

## 2020-02-19 DIAGNOSIS — C8203 Follicular lymphoma grade I, intra-abdominal lymph nodes: Secondary | ICD-10-CM | POA: Diagnosis present

## 2020-02-19 DIAGNOSIS — Z90721 Acquired absence of ovaries, unilateral: Secondary | ICD-10-CM | POA: Diagnosis not present

## 2020-02-19 LAB — CBC WITH DIFFERENTIAL/PLATELET
Abs Immature Granulocytes: 0.01 10*3/uL (ref 0.00–0.07)
Basophils Absolute: 0 10*3/uL (ref 0.0–0.1)
Basophils Relative: 1 %
Eosinophils Absolute: 0.1 10*3/uL (ref 0.0–0.5)
Eosinophils Relative: 1 %
HCT: 40.6 % (ref 36.0–46.0)
Hemoglobin: 14.3 g/dL (ref 12.0–15.0)
Immature Granulocytes: 0 %
Lymphocytes Relative: 52 %
Lymphs Abs: 3.2 10*3/uL (ref 0.7–4.0)
MCH: 30.7 pg (ref 26.0–34.0)
MCHC: 35.2 g/dL (ref 30.0–36.0)
MCV: 87.1 fL (ref 80.0–100.0)
Monocytes Absolute: 0.6 10*3/uL (ref 0.1–1.0)
Monocytes Relative: 10 %
Neutro Abs: 2.2 10*3/uL (ref 1.7–7.7)
Neutrophils Relative %: 36 %
Platelets: 244 10*3/uL (ref 150–400)
RBC: 4.66 MIL/uL (ref 3.87–5.11)
RDW: 13.2 % (ref 11.5–15.5)
WBC: 6.1 10*3/uL (ref 4.0–10.5)
nRBC: 0 % (ref 0.0–0.2)

## 2020-02-19 LAB — COMPREHENSIVE METABOLIC PANEL
ALT: 39 U/L (ref 0–44)
AST: 35 U/L (ref 15–41)
Albumin: 4 g/dL (ref 3.5–5.0)
Alkaline Phosphatase: 54 U/L (ref 38–126)
Anion gap: 7 (ref 5–15)
BUN: 21 mg/dL (ref 8–23)
CO2: 25 mmol/L (ref 22–32)
Calcium: 9.3 mg/dL (ref 8.9–10.3)
Chloride: 107 mmol/L (ref 98–111)
Creatinine, Ser: 0.92 mg/dL (ref 0.44–1.00)
GFR, Estimated: 60 mL/min — ABNORMAL LOW (ref >60)
Glucose, Bld: 110 mg/dL — ABNORMAL HIGH (ref 70–99)
Potassium: 3.9 mmol/L (ref 3.5–5.1)
Sodium: 139 mmol/L (ref 135–145)
Total Bilirubin: 0.7 mg/dL (ref 0.3–1.2)
Total Protein: 7.1 g/dL (ref 6.5–8.1)

## 2020-02-19 LAB — LACTATE DEHYDROGENASE: LDH: 109 U/L (ref 98–192)

## 2020-02-19 NOTE — Assessment & Plan Note (Addendum)
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  Clinically stable.  CT scan July 2020-negative for any lymphadenopathy.  Discussed with patient that further lymphomas are usually incurable; however very treatable.  STABLE.  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   # COVID BOOSTER: Discussed given patient's diagnosis and other comorbidities/therapies-patient would be considered immunocompromised.  As per CDC recommendation/FDA approval-I would recommend booster vaccine.  Patient is interested.    # Disposition:  # -follow-up 6 m-MD /lab-CBC CMP [facility]- Dr.B

## 2020-02-19 NOTE — Progress Notes (Signed)
[place] OFFICE PROGRESS NOTE  Patient Care Team: Marinda Elk, MD as PCP - General (Physician Assistant)  Cancer Staging No matching staging information was found for the patient.    Oncology History Overview Note    1.FEB 2013-  MESENTERIC LYMPHADENOPATHY;] Pet positive lymphadenopathy.; . 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 3. Rituxan weekly x4 finished in March of 2013 4. PET scan in July of 2013 complete remission. 5. Maintenance Rituxan started in July of 2013;  x 2 years [finished 2015]  #April 2019-melanoma/left side of the nose [2020]/ Left arm [2021][Dr. Kowalski/Dr.Dasher]   # LFTs- slightly up/ ? faty liver.  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  In the interim patient has stopped drinking alcohol. She has also had some intentional weight loss.   No new lumps or bumps. No night sweats.    REVIEW OF SYSTEMS:  Review of Systems  Constitutional: Negative.  Negative for chills, fever, malaise/fatigue and weight loss.  HENT: Negative for congestion, ear pain and tinnitus.   Eyes: Negative.  Negative for blurred vision and double vision.  Respiratory: Negative.  Negative for cough, sputum production and shortness of breath.   Cardiovascular: Negative.  Negative for chest pain, palpitations and leg swelling.  Gastrointestinal: Negative.  Negative for abdominal pain, constipation, diarrhea, nausea and vomiting.  Musculoskeletal: Positive for back pain and joint pain. Negative for falls.  Neurological: Negative.  Negative  for weakness and headaches.  Endo/Heme/Allergies: Negative.  Does not bruise/bleed easily.  Psychiatric/Behavioral: Negative.  Negative for depression. The patient is not nervous/anxious and does not have insomnia.    PAST MEDICAL HISTORY :  Past Medical History:  Diagnosis Date  . Allergic rhinitis   . Allergy    seasonal  . Arthritis    degenerative of hip right  . Barrett esophagus   . Barrett esophagus   . Benign essential tremor   . Chronic bronchitis ([place])   . GERD (gastroesophageal reflux disease)   . 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 Hypertension   . Melanoma ([place])   . Migraine   . 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 ([place]) 09/13/2014  . Non Hodgkin's lymphoma ([place])   . Non Hodgkin's lymphoma ([place])   . RAD (reactive airway disease)   . Skin cancer     PAST SURGICAL HISTORY :   Past Surgical History:  Procedure Laterality Date  . ABDOMINAL HYSTERECTOMY    . APPENDECTOMY    . BASAL CELL CARCINOMA EXCISION     on both sides of nose  . BREAST BIOPSY Left 12/2013   hyperplasia  . CHOLECYSTECTOMY    . COLONOSCOPY WITH PROPOFOL N/A 06/05/2017   Procedure: COLONOSCOPY WITH PROPOFOL;  Surgeon: Lollie Sails, MD;  Location[facility] ENDOSCOPY;  Service: Endoscopy;  Laterality: N/A;  . ESOPHAGOGASTRODUODENOSCOPY (EGD) WITH PROPOFOL N/A 03/30/2015   Procedure: ESOPHAGOGASTRODUODENOSCOPY (EGD) WITH PROPOFOL;  Surgeon: Lollie Sails, MD;  Location[facility] ENDOSCOPY;  Service: Endoscopy;  Laterality: N/A;  . ESOPHAGOGASTRODUODENOSCOPY (EGD) WITH PROPOFOL N/A 06/05/2017   Procedure: ESOPHAGOGASTRODUODENOSCOPY (  EGD) WITH PROPOFOL;  Surgeon: Lollie Sails, MD;  Location[facility] ENDOSCOPY;  Service: Endoscopy;  Laterality: N/A;  . FACIAL COSMETIC SURGERY    . history of multiple colonic polyps    . lymph node removal from left groin    . OOPHORECTOMY    . VENTRAL HERNIA REPAIR N/A 10/02/2014   Procedure: HERNIA REPAIR VENTRAL ADULT;   Surgeon: Leonie Green, MD;  Location: [facility] ORS;  Service: General;  Laterality: N/A;  with mesh    FAMILY HISTORY :   Family History  Problem Relation Age of Onset  . Alcohol abuse Father   . Stroke Father   . Diabetes Sister   . Kidney cancer Sister   . Melanoma Brother   . Diabetes Brother   . Heart attack Brother   . Breast cancer Neg Hx   . Bladder Cancer Neg Hx   . Prostate cancer Neg Hx     SOCIAL HISTORY:   Social History   Tobacco Use  . Smoking status: Never Smoker  . Smokeless tobacco: Never Used  Substance Use Topics  . Alcohol use: Yes    Alcohol/week: 1.0 standard drink    Types: 1 Cans of beer per week    Comment: rare social occasions only  . Drug use: No    ALLERGIES:  is allergic to buprenorphine hcl and morphine and related.  MEDICATIONS:  Current Outpatient Medications  Medication Sig Dispense Refill  . aspirin EC 81 MG tablet Take 81 mg by mouth.    . Calcium Carbonate-Vit D-Min (CALCIUM 1200 PO) Take 1 tablet by mouth 1 day or 1 dose.    . Cholecalciferol (VITAMIN D3) 25 MCG (1000 UT) CAPS Take 1 capsule by mouth 1 day or 1 dose.    Marland Kitchen lisinopril (PRINIVIL,ZESTRIL) 10 MG tablet     . loperamide (IMODIUM A-D) 2 MG tablet Take 2 mg by mouth 4 (four) times daily as needed for diarrhea or loose stools.    Marland Kitchen loratadine (CLARITIN) 10 MG tablet Take 10 mg by mouth every morning.     Marland Kitchen omeprazole (PRILOSEC) 20 MG capsule     . psyllium (METAMUCIL) 58.6 % packet Take 1 packet by mouth daily.     No current facility-administered medications for this visit.    PHYSICAL EXAMINATION: ECOG PERFORMANCE STATUS: 0 - Asymptomatic  BP 124/70 (BP Location: Left Arm, Patient Position: Sitting, Cuff Size: Normal)   Pulse 76   Temp 98.2 F (36.8 C) (Tympanic)   Resp 16   Ht 5\' 2"  (1.575 m)   Wt 175 lb (79.4 kg)   SpO2 96%   BMI 32.01 kg/m   Filed Weights   02/19/20 0956  Weight: 175 lb (79.4 kg)   Physical Exam Constitutional:      Comments:  Obese.  Walk independently.  HENT:     Head: Normocephalic and atraumatic.     Mouth/Throat:     Pharynx: No oropharyngeal exudate.  Eyes:     Pupils: Pupils are equal, round, and reactive to light.  Cardiovascular:     Rate and Rhythm: Normal rate and regular rhythm.  Pulmonary:     Effort: Pulmonary effort is normal. No respiratory distress.     Breath sounds: Normal breath sounds. No wheezing.  Abdominal:     General: Bowel sounds are normal. There is no distension.     Palpations: Abdomen is soft. There is no mass.     Tenderness: There is no abdominal tenderness. There is  no guarding or rebound.  Musculoskeletal:        General: No tenderness. Normal range of motion.     Cervical back: Normal range of motion and neck supple.  Skin:    General: Skin is warm.  Neurological:     Mental Status: She is alert and oriented to person, place, and time.  Psychiatric:        Mood and Affect: Affect normal.    LABORATORY DATA:  I have reviewed the data as listed    Component Value Date/Time   NA 139 02/19/2020 0934   NA 141 03/03/2014 1022   K 3.9 02/19/2020 0934   K 4.1 03/03/2014 1022   CL 107 02/19/2020 0934   CL 105 03/03/2014 1022   CO2 25 02/19/2020 0934   CO2 28 03/03/2014 1022   GLUCOSE 110 (H) 02/19/2020 0934   GLUCOSE 112 (H) 03/03/2014 1022   BUN 21 02/19/2020 0934   BUN 18 03/03/2014 1022   CREATININE 0.92 02/19/2020 0934   CREATININE 0.94 03/03/2014 1022   CALCIUM 9.3 02/19/2020 0934   CALCIUM 9.4 03/03/2014 1022   PROT 7.1 02/19/2020 0934   PROT 7.4 03/03/2014 1022   ALBUMIN 4.0 02/19/2020 0934   ALBUMIN 4.1 03/03/2014 1022   AST 35 02/19/2020 0934   AST 29 03/03/2014 1022   ALT 39 02/19/2020 0934   ALT 53 03/03/2014 1022   ALKPHOS 54 02/19/2020 0934   ALKPHOS 81 03/03/2014 1022   BILITOT 0.7 02/19/2020 0934   BILITOT 0.4 03/03/2014 1022   GFRNONAA 60 (L) 02/19/2020 0934   GFRNONAA >60 03/03/2014 1022   GFRNONAA >60 09/02/2013 0939   GFRAA >60  08/21/2019 1031   GFRAA >60 03/03/2014 1022   GFRAA >60 09/02/2013 0939    No results found for: SPEP, UPEP  Lab Results  Component Value Date   WBC 6.1 02/19/2020   NEUTROABS 2.2 02/19/2020   HGB 14.3 02/19/2020   HCT 40.6 02/19/2020   MCV 87.1 02/19/2020   PLT 244 02/19/2020      Chemistry      Component Value Date/Time   NA 139 02/19/2020 0934   NA 141 03/03/2014 1022   K 3.9 02/19/2020 0934   K 4.1 03/03/2014 1022   CL 107 02/19/2020 0934   CL 105 03/03/2014 1022   CO2 25 02/19/2020 0934   CO2 28 03/03/2014 1022   BUN 21 02/19/2020 0934   BUN 18 03/03/2014 1022   CREATININE 0.92 02/19/2020 0934   CREATININE 0.94 03/03/2014 1022      Component Value Date/Time   CALCIUM 9.3 02/19/2020 0934   CALCIUM 9.4 03/03/2014 1022   ALKPHOS 54 02/19/2020 0934   ALKPHOS 81 03/03/2014 1022   AST 35 02/19/2020 0934   AST 29 03/03/2014 1022   ALT 39 02/19/2020 0934   ALT 53 03/03/2014 1022   BILITOT 0.7 02/19/2020 0934   BILITOT 0.4 03/03/2014 1022       RADIOGRAPHIC STUDIES: I have personally reviewed the radiological images as listed and agreed with the findings in the report. No results found.   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  Clinically stable.  CT scan July 2020-negative for any lymphadenopathy.  Discussed with patient that further lymphomas are usually incurable; however very treatable.  STABLE.  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   # COVID BOOSTER: Discussed given patient's diagnosis and other comorbidities/therapies-patient would be considered immunocompromised.  As per CDC recommendation/FDA approval-I would recommend booster vaccine.  Patient is interested.    # Disposition:  # -follow-up  6 m-MD /lab-CBC CMP L[facility]- Dr.B   Orders Placed This Encounter  Procedures  . CBC with Differential    Standing Status:   Future    Standing Expiration Date:   02/18/2021  . Comprehensive metabolic panel    Standing Status:   Future    Standing Expiration Date:   02/18/2021  . Lactate dehydrogenase    Standing Status:   Future    Standing Expiration Date:   02/18/2021     Cammie Sickle, MD 02/22/2020 4:35 PM

## 2020-05-12 ENCOUNTER — Other Ambulatory Visit
Admission: RE | Admit: 2020-05-12 | Discharge: 2020-05-12 | Disposition: A | Discharge status: Home / Self Care | Payer: Medicare Other | Source: Ambulatory Visit | Attending: Gastroenterology | Admitting: Gastroenterology

## 2020-05-12 ENCOUNTER — Other Ambulatory Visit: Payer: Self-pay

## 2020-05-12 DIAGNOSIS — Z20822 Contact with and (suspected) exposure to covid-19: Secondary | ICD-10-CM | POA: Insufficient documentation

## 2020-05-12 DIAGNOSIS — Z01812 Encounter for preprocedural laboratory examination: Secondary | ICD-10-CM | POA: Diagnosis present

## 2020-05-13 ENCOUNTER — Encounter: Payer: Self-pay | Admitting: *Deleted

## 2020-05-13 LAB — SARS CORONAVIRUS 2 (TAT 6-24 HRS): SARS Coronavirus 2: NEGATIVE

## 2020-05-14 ENCOUNTER — Ambulatory Visit
Admission: RE | Admit: 2020-05-14 | Discharge: 2020-05-14 | Disposition: A | Discharge status: Home / Self Care | Payer: Medicare Other | Attending: Gastroenterology | Admitting: Gastroenterology

## 2020-05-14 ENCOUNTER — Encounter
Admission: RE | Disposition: A | Discharge status: Home / Self Care | Payer: Self-pay | Source: Home / Self Care | Attending: Gastroenterology

## 2020-05-14 ENCOUNTER — Ambulatory Visit: Payer: Medicare Other | Admitting: Certified Registered Nurse Anesthetist

## 2020-05-14 ENCOUNTER — Encounter: Payer: Self-pay | Admitting: *Deleted

## 2020-05-14 ENCOUNTER — Other Ambulatory Visit: Payer: Self-pay

## 2020-05-14 DIAGNOSIS — K449 Diaphragmatic hernia without obstruction or gangrene: Secondary | ICD-10-CM | POA: Diagnosis not present

## 2020-05-14 DIAGNOSIS — Z8572 Personal history of non-Hodgkin lymphomas: Secondary | ICD-10-CM | POA: Insufficient documentation

## 2020-05-14 DIAGNOSIS — Z85820 Personal history of malignant melanoma of skin: Secondary | ICD-10-CM | POA: Insufficient documentation

## 2020-05-14 DIAGNOSIS — K3189 Other diseases of stomach and duodenum: Secondary | ICD-10-CM | POA: Insufficient documentation

## 2020-05-14 DIAGNOSIS — K2970 Gastritis, unspecified, without bleeding: Secondary | ICD-10-CM | POA: Diagnosis not present

## 2020-05-14 DIAGNOSIS — K219 Gastro-esophageal reflux disease without esophagitis: Secondary | ICD-10-CM | POA: Diagnosis not present

## 2020-05-14 DIAGNOSIS — Z1211 Encounter for screening for malignant neoplasm of colon: Secondary | ICD-10-CM | POA: Diagnosis not present

## 2020-05-14 DIAGNOSIS — J42 Unspecified chronic bronchitis: Secondary | ICD-10-CM | POA: Insufficient documentation

## 2020-05-14 DIAGNOSIS — Z86010 Personal history of colonic polyps: Secondary | ICD-10-CM | POA: Insufficient documentation

## 2020-05-14 DIAGNOSIS — Z7982 Long term (current) use of aspirin: Secondary | ICD-10-CM | POA: Insufficient documentation

## 2020-05-14 DIAGNOSIS — K640 First degree hemorrhoids: Secondary | ICD-10-CM | POA: Diagnosis not present

## 2020-05-14 DIAGNOSIS — Z885 Allergy status to narcotic agent status: Secondary | ICD-10-CM | POA: Diagnosis not present

## 2020-05-14 DIAGNOSIS — K2270 Barrett's esophagus without dysplasia: Secondary | ICD-10-CM | POA: Insufficient documentation

## 2020-05-14 HISTORY — DX: Hyperlipidemia, unspecified: E78.5

## 2020-05-14 HISTORY — DX: Personal history of colonic polyps: Z86.010

## 2020-05-14 HISTORY — DX: Personal history of colon polyps, unspecified: Z86.0100

## 2020-05-14 HISTORY — PX: COLONOSCOPY WITH PROPOFOL: SHX5780

## 2020-05-14 HISTORY — PX: ESOPHAGOGASTRODUODENOSCOPY (EGD) WITH PROPOFOL: SHX5813

## 2020-05-14 SURGERY — COLONOSCOPY WITH PROPOFOL
Anesthesia: General

## 2020-05-14 MED ORDER — PHENYLEPHRINE HCL (PRESSORS) 10 MG/ML IV SOLN
INTRAVENOUS | Status: DC | PRN
  Administered 2020-05-14 (×3): 50 ug via INTRAVENOUS

## 2020-05-14 MED ORDER — PROPOFOL 10 MG/ML IV BOLUS
INTRAVENOUS | Status: DC | PRN
  Administered 2020-05-14: 50 mg via INTRAVENOUS
  Administered 2020-05-14: 30 mg via INTRAVENOUS

## 2020-05-14 MED ORDER — SODIUM CHLORIDE 0.9 % IV SOLN
INTRAVENOUS | Status: DC
  Administered 2020-05-14: 1000 mL via INTRAVENOUS

## 2020-05-14 MED ORDER — LIDOCAINE HCL (CARDIAC) PF 100 MG/5ML IV SOSY
PREFILLED_SYRINGE | INTRAVENOUS | Status: DC | PRN
  Administered 2020-05-14: 50 mg via INTRAVENOUS

## 2020-05-14 MED ORDER — PROPOFOL 10 MG/ML IV BOLUS
INTRAVENOUS | Status: AC
  Filled 2020-05-14: qty 20

## 2020-05-14 MED ORDER — PROPOFOL 500 MG/50ML IV EMUL
INTRAVENOUS | Status: AC
  Filled 2020-05-14: qty 50

## 2020-05-14 MED ORDER — PROPOFOL 500 MG/50ML IV EMUL
INTRAVENOUS | Status: DC | PRN
  Administered 2020-05-14: 125 ug/kg/min via INTRAVENOUS

## 2020-05-14 NOTE — Op Note (Signed)
[facility] Gastroenterology Patient Name: [name] Procedure Date: 05/14/2020 8:43 AM MRN: [id number] Account #: [id number] Date of Birth: 03-09-1941 Admit Type: Outpatient Age: 80 Room: [facility] ENDO ROOM 3 Gender: Female Note Status: Finalized Procedure:             Colonoscopy Indications:           High risk colon cancer surveillance: Personal history                         of colonic polyps Providers:             Andrey Farmer MD, MD Medicines:             Monitored Anesthesia Care Complications:         No immediate complications. Procedure:             Pre-Anesthesia Assessment:                        - Prior to the procedure, a History and Physical was                         performed, and patient medications and allergies were                         reviewed. The patient is competent. The risks and                         benefits of the procedure and the sedation options and                         risks were discussed with the patient. All questions                         were answered and informed consent was obtained.                         Patient identification and proposed procedure were                         verified by the physician, the nurse, the anesthetist                         and the technician in the endoscopy suite. Mental                         Status Examination: alert and oriented. Airway                         Examination: normal oropharyngeal airway and neck                         mobility. Respiratory Examination: clear to                         auscultation. CV Examination: normal. Prophylactic                         Antibiotics: The patient does not require prophylactic  antibiotics. Prior Anticoagulants: The patient has                         taken no previous anticoagulant or antiplatelet                         agents. ASA Grade Assessment: II - A patient with mild                          systemic disease. After reviewing the risks and                         benefits, the patient was deemed in satisfactory                         condition to undergo the procedure. The anesthesia                         plan was to use monitored anesthesia care (MAC).                         Immediately prior to administration of medications,                         the patient was re-assessed for adequacy to receive                         sedatives. The heart rate, respiratory rate, oxygen                         saturations, blood pressure, adequacy of pulmonary                         ventilation, and response to care were monitored                         throughout the procedure. The physical status of the                         patient was re-assessed after the procedure.                        After obtaining informed consent, the colonoscope was                         passed under direct vision. Throughout the procedure,                         the patient's blood pressure, pulse, and oxygen                         saturations were monitored continuously. The                         Colonoscope was introduced through the anus and                         advanced to the the cecum, identified by appendiceal  orifice and ileocecal valve. The colonoscopy was                         performed without difficulty. The patient tolerated                         the procedure well. The quality of the bowel                         preparation was poor. Findings:      The perianal and digital rectal examinations were normal.      A large amount of liquid stool was found in the ascending colon, making       visualization difficult.      Non-bleeding hemorrhoids were found during retroflexion. The hemorrhoids       were Grade I (internal hemorrhoids that do not prolapse).      The exam was otherwise without abnormality on direct and retroflexion        views. Impression:            - Preparation of the colon was poor.                        - Stool in the ascending colon.                        - Non-bleeding hemorrhoids.                        - The examination was otherwise normal on direct and                         retroflexion views.                        - No specimens collected. Recommendation:        - Discharge patient to home.                        - Resume previous diet.                        - Continue present medications.                        - Repeat colonoscopy at the next available appointment                         because the bowel preparation was suboptimal.                        - Return to referring physician as previously                         scheduled. Procedure Code(s):     --- Professional ---                        D6387, Colorectal cancer screening; colonoscopy on                         individual at high risk Diagnosis Code(s):     ---  Professional ---                        Z86.010, Personal history of colonic polyps                        K64.0, First degree hemorrhoids CPT copyright 2019 American [employer]. All rights reserved. The codes documented in this report are preliminary and upon coder review may  be revised to meet current compliance requirements. Andrey Farmer MD, MD 05/14/2020 9:35:42 AM Number of Addenda: 0 Note Initiated On: 05/14/2020 8:43 AM Scope Withdrawal Time: 0 hours 5 minutes 54 seconds  Total Procedure Duration: 0 hours 14 minutes 37 seconds  Estimated Blood Loss:  Estimated blood loss: none.      [facility]

## 2020-05-14 NOTE — Transfer of Care (Signed)
Immediate Anesthesia Transfer of Care Note  Patient: [name]  Procedure(s) Performed: COLONOSCOPY WITH PROPOFOL (N/A ) ESOPHAGOGASTRODUODENOSCOPY (EGD) WITH PROPOFOL (N/A )  Patient Location: PACU  Anesthesia Type:General  Level of Consciousness: awake, alert  and oriented  Airway & Oxygen Therapy: Patient Spontanous Breathing and Patient connected to nasal cannula oxygen  Post-op Assessment: Report given to RN and Post -op Vital signs reviewed and stable  Post vital signs: Reviewed and stable  Last Vitals:  Vitals Value Taken Time  BP    Temp    Pulse 78 05/14/20 0927  Resp 19 05/14/20 0927  SpO2 93 % 05/14/20 0927  Vitals shown include unvalidated device data.  Last Pain: There were no vitals filed for this visit.       Complications: No complications documented.

## 2020-05-14 NOTE — H&P (Signed)
Outpatient short stay form Pre-procedure 05/14/2020 8:47 AM Raylene Miyamoto MD, MPH  Primary Physician: Dr. Carrie Mew  Reason for visit:  History of Barrett's and polyps  History of present illness:   80 y/o lady with history of BE's and polyp history here for EGD/Colonoscopy. No blood thinners. History of hysterectomy. No family history of GI malignancies.    Current Facility-Administered Medications:  .  0.9 %  sodium chloride infusion, , Intravenous, Continuous, Dalisha Shively, Hilton Cork, MD, Last Rate: 20 mL/hr at 05/14/20 0811, 1,000 mL at 05/14/20 0811  Medications Prior to Admission  Medication Sig Dispense Refill Last Dose  . Calcium Carbonate-Vit D-Min (CALCIUM 1200 PO) Take 1 tablet by mouth 1 day or 1 dose.   05/13/2020 at Unknown time  . Cholecalciferol (VITAMIN D3) 25 MCG (1000 UT) CAPS Take 1 capsule by mouth 1 day or 1 dose.   05/13/2020 at Unknown time  . conjugated estrogens (PREMARIN) vaginal cream Place 1 Applicatorful vaginally daily.   05/13/2020 at Unknown time  . lisinopril (PRINIVIL,ZESTRIL) 10 MG tablet    05/13/2020 at Unknown time  . loratadine (CLARITIN) 10 MG tablet Take 10 mg by mouth every morning.    05/13/2020 at Unknown time  . omeprazole (PRILOSEC) 20 MG capsule    05/13/2020 at Unknown time  . psyllium (METAMUCIL) 58.6 % packet Take 1 packet by mouth daily.   05/13/2020 at Unknown time  . aspirin EC 81 MG tablet Take 81 mg by mouth. (Patient not taking: Reported on 05/14/2020)   Not Taking at Unknown time  . loperamide (IMODIUM A-D) 2 MG tablet Take 2 mg by mouth 4 (four) times daily as needed for diarrhea or loose stools.   prn     Allergies  Allergen Reactions  . Buprenorphine Hcl Nausea And Vomiting  . Morphine And Related Nausea And Vomiting     Past Medical History:  Diagnosis Date  . Allergic rhinitis   . Allergy    seasonal  . Arthritis    degenerative of hip right  . Barrett esophagus   . Barrett esophagus   . Benign essential tremor   . Chronic  bronchitis ([place])   . GERD (gastroesophageal reflux disease)   . History of colonic polyps   . 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 Hyperlipidemia   . Hypertension   . Melanoma ([place])   . Migraine   . 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 Non Hodgkin's lymphoma ([place])   . Non Hodgkin's lymphoma ([place])   . RAD (reactive airway disease)   . Skin cancer     Review of systems:  Otherwise negative.    Physical Exam  Gen: Alert, oriented. Appears stated age.  HEENT: PERRLA. Lungs: No respiratory distress CV: RRR. Abd: soft, benign, no masses Ext: No edema    Planned procedures: Proceed with EGD/colonoscopy. The patient understands the nature of the planned procedure, indications, risks, alternatives and potential complications including but not limited to bleeding, infection, perforation, damage to internal organs and possible oversedation/side effects from anesthesia. The patient agrees and gives consent to proceed.  Please refer to procedure notes for findings, recommendations and patient disposition/instructions.     Raylene Miyamoto MD, MPH Gastroenterology 05/14/2020  8:47 AM

## 2020-05-14 NOTE — Op Note (Signed)
[facility] Gastroenterology Patient Name: [name] Procedure Date: 05/14/2020 8:43 AM MRN: [id number] Account #: [id number] Date of Birth: Sep 19, 1940 Admit Type: Outpatient Age: 80 Room: [facility] ENDO ROOM 3 Gender: Female Note Status: Finalized Procedure:             Upper GI endoscopy Indications:           Gastro-esophageal reflux disease, Barrett's esophagus Providers:             Andrey Farmer MD, MD Medicines:             Monitored Anesthesia Care Complications:         No immediate complications. Estimated blood loss:                         Minimal. Procedure:             Pre-Anesthesia Assessment:                        - Prior to the procedure, a History and Physical was                         performed, and patient medications and allergies were                         reviewed. The patient is competent. The risks and                         benefits of the procedure and the sedation options and                         risks were discussed with the patient. All questions                         were answered and informed consent was obtained.                         Patient identification and proposed procedure were                         verified by the physician, the nurse, the anesthetist                         and the technician in the endoscopy suite. Mental                         Status Examination: alert and oriented. Airway                         Examination: normal oropharyngeal airway and neck                         mobility. Respiratory Examination: clear to                         auscultation. CV Examination: normal. Prophylactic                         Antibiotics: The patient does not require prophylactic  antibiotics. Prior Anticoagulants: The patient has                         taken no previous anticoagulant or antiplatelet                         agents. ASA Grade Assessment: II - A patient with mild                          systemic disease. After reviewing the risks and                         benefits, the patient was deemed in satisfactory                         condition to undergo the procedure. The anesthesia                         plan was to use monitored anesthesia care (MAC).                         Immediately prior to administration of medications,                         the patient was re-assessed for adequacy to receive                         sedatives. The heart rate, respiratory rate, oxygen                         saturations, blood pressure, adequacy of pulmonary                         ventilation, and response to care were monitored                         throughout the procedure. The physical status of the                         patient was re-assessed after the procedure.                        After obtaining informed consent, the endoscope was                         passed under direct vision. Throughout the procedure,                         the patient's blood pressure, pulse, and oxygen                         saturations were monitored continuously. The Endoscope                         was introduced through the mouth, and advanced to the                         second part of duodenum. The upper GI endoscopy was  accomplished without difficulty. The patient tolerated                         the procedure well. Findings:      The esophagus and gastroesophageal junction were examined with white       light and narrow band imaging (NBI). There were esophageal mucosal       changes suggestive of short-segment Barrett's esophagus. These changes       involved the mucosa extending to the Z-line. Scattered islands of       salmon-colored mucosa were present. Mucosa was biopsied with a cold       forceps for histology in a targeted manner in the lower third of the       esophagus. One specimen bottle was sent to pathology. Estimated blood        loss was minimal.      Localized prominent gastric folds were found in the gastric fundus.      The exam of the stomach was otherwise normal.      The examined duodenum was normal. Impression:            - Esophageal mucosal changes suggestive of                         short-segment Barrett's esophagus. Biopsied.                        - Enlarged gastric folds.                        - Normal examined duodenum. Recommendation:        - Perform a colonoscopy today.                        - Will likely discuss with advanced endoscopist to see                         if EUS of the prominent gastric fold would be                         beneficial.                        - Await pathology results. Procedure Code(s):     --- Professional ---                        [phone], Esophagogastroduodenoscopy, flexible,                         transoral; with biopsy, single or multiple Diagnosis Code(s):     --- Professional ---                        K22.70, Barrett's esophagus without dysplasia                        K29.60, Other gastritis without bleeding                        K21.9, Gastro-esophageal reflux disease without  esophagitis CPT copyright 2019 American Medical Association. All rights reserved. The codes documented in this report are preliminary and upon coder review may  be revised to meet current compliance requirements. Andrey Farmer MD, MD 05/14/2020 9:32:50 AM Number of Addenda: 0 Note Initiated On: 05/14/2020 8:43 AM Estimated Blood Loss:  Estimated blood loss was minimal.      [facility]

## 2020-05-14 NOTE — Anesthesia Preprocedure Evaluation (Signed)
Anesthesia Evaluation  Patient identified by MRN, date of birth, ID band Patient awake    Reviewed: Allergy & Precautions, H&P , NPO status , Patient's Chart, lab work & pertinent test results  History of Anesthesia Complications Negative for: history of anesthetic complications  Airway Mallampati: III  TM Distance: >3 FB Neck ROM: limited    Dental  (+) Poor Dentition, Chipped, Dental Advidsory Given   Pulmonary neg shortness of breath, asthma , COPD, neg recent URI, former smoker (worked around smokers),    Pulmonary exam normal breath sounds clear to auscultation       Cardiovascular Exercise Tolerance: Good hypertension, (-) angina(-) Past MI, (-) Cardiac Stents and (-) DOE negative cardio ROS Normal cardiovascular exam(-) dysrhythmias (-) Valvular Problems/Murmurs Rhythm:regular Rate:Normal     Neuro/Psych  Headaches, neg Seizures negative psych ROS   GI/Hepatic Neg liver ROS, GERD  Controlled,  Endo/Other  negative endocrine ROS  Renal/GU negative Renal ROS  negative genitourinary   Musculoskeletal  (+) Arthritis ,   Abdominal   Peds  Hematology negative hematology ROS (+)   Anesthesia Other Findings Past Medical History:   Non Hodgkin's lymphoma ([facility])                                 Barrett esophagus                                            Skin cancer                                                  Asthma                                                       Migraine                                                     HSV (herpes simplex virus) infection                           Comment:of upper lip   Allergy                                                        Comment:seasonal   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     Arthritis                                                      Comment:degenerative of hip right  Past Surgical History:   lymph node removal from left groin                             CHOLECYSTECTOMY                                               FACIAL COSMETIC SURGERY                                       APPENDECTOMY                                                  ABDOMINAL HYSTERECTOMY                                        history of multiple colonic polyps                            BASAL CELL CARCINOMA EXCISION                                   Comment:on both sides of nose   VENTRAL HERNIA REPAIR                           N/A 10/02/2014      Comment:Procedure: HERNIA REPAIR VENTRAL ADULT;                Surgeon: Leonie Green, MD;  Location:               [facility] ORS;  Service: General;  Laterality: N/A;               with mesh   BREAST BIOPSY                                   Left 12/2013         Comment:hyperplasia     Reproductive/Obstetrics negative OB ROS                             Anesthesia Physical  Anesthesia Plan  ASA: III  Anesthesia Plan: General   Post-op Pain Management:    Induction: Intravenous  PONV Risk Score and Plan: 3 and TIVA and Propofol infusion  Airway Management Planned: Nasal Cannula and Natural Airway  Additional Equipment:   Intra-op Plan:   Post-operative Plan:   Informed Consent: I have reviewed the patients History and Physical, chart, labs and discussed the procedure including the risks, benefits and alternatives for the proposed anesthesia with the patient or authorized representative who has indicated his/her understanding and acceptance.     Dental Advisory Given  Plan Discussed with: Anesthesiologist, CRNA and Surgeon  Anesthesia Plan Comments:  Anesthesia Quick Evaluation  

## 2020-05-14 NOTE — Interval H&P Note (Signed)
History and Physical Interval Note:  05/14/2020 8:52 AM  [name]  has presented today for surgery, with the diagnosis of BARRETT'S HX ADEN POLYPS.  The various methods of treatment have been discussed with the patient and family. After consideration of risks, benefits and other options for treatment, the patient has consented to  Procedure(s): COLONOSCOPY WITH PROPOFOL (N/A) ESOPHAGOGASTRODUODENOSCOPY (EGD) WITH PROPOFOL (N/A) as a surgical intervention.  The patient's history has been reviewed, patient examined, no change in status, stable for surgery.  I have reviewed the patient's chart and labs.  Questions were answered to the patient's satisfaction.     Lesly Rubenstein  Ok to proceed with EGD/Colonoscopy

## 2020-05-14 NOTE — Anesthesia Postprocedure Evaluation (Signed)
Anesthesia Post Note  Patient: ANAYIAH HOWDEN  Procedure(s) Performed: COLONOSCOPY WITH PROPOFOL (N/A ) ESOPHAGOGASTRODUODENOSCOPY (EGD) WITH PROPOFOL (N/A )  Patient location during evaluation: Endoscopy Anesthesia Type: General Level of consciousness: awake and alert Pain management: pain level controlled Vital Signs Assessment: post-procedure vital signs reviewed and stable Respiratory status: spontaneous breathing, nonlabored ventilation, respiratory function stable and patient connected to nasal cannula oxygen Cardiovascular status: blood pressure returned to baseline and stable Postop Assessment: no apparent nausea or vomiting Anesthetic complications: no   No complications documented.   Last Vitals:  Vitals:   05/14/20 0928 05/14/20 0958  BP:  107/73  Pulse:  70  Temp: (!) 36.3 C     Last Pain:  Vitals:   05/14/20 0928  TempSrc: Temporal                 Martha Clan

## 2020-05-17 LAB — SURGICAL PATHOLOGY

## 2020-05-31 ENCOUNTER — Other Ambulatory Visit: Payer: Self-pay

## 2020-05-31 ENCOUNTER — Emergency Department: Payer: Medicare Other

## 2020-05-31 ENCOUNTER — Inpatient Hospital Stay
Admission: EM | Admit: 2020-05-31 | Discharge: 2020-06-02 | DRG: 389 | Disposition: A | Discharge status: Home / Self Care | Payer: Medicare Other | Attending: Internal Medicine | Admitting: Internal Medicine

## 2020-05-31 DIAGNOSIS — K56609 Unspecified intestinal obstruction, unspecified as to partial versus complete obstruction: Secondary | ICD-10-CM | POA: Diagnosis not present

## 2020-05-31 DIAGNOSIS — Z808 Family history of malignant neoplasm of other organs or systems: Secondary | ICD-10-CM

## 2020-05-31 DIAGNOSIS — K567 Ileus, unspecified: Secondary | ICD-10-CM | POA: Diagnosis not present

## 2020-05-31 DIAGNOSIS — R110 Nausea: Secondary | ICD-10-CM | POA: Diagnosis not present

## 2020-05-31 DIAGNOSIS — N390 Urinary tract infection, site not specified: Secondary | ICD-10-CM | POA: Diagnosis present

## 2020-05-31 DIAGNOSIS — Z8249 Family history of ischemic heart disease and other diseases of the circulatory system: Secondary | ICD-10-CM | POA: Diagnosis not present

## 2020-05-31 DIAGNOSIS — Z90710 Acquired absence of both cervix and uterus: Secondary | ICD-10-CM | POA: Diagnosis not present

## 2020-05-31 DIAGNOSIS — K2270 Barrett's esophagus without dysplasia: Secondary | ICD-10-CM | POA: Diagnosis present

## 2020-05-31 DIAGNOSIS — Z8572 Personal history of non-Hodgkin lymphomas: Secondary | ICD-10-CM | POA: Diagnosis not present

## 2020-05-31 DIAGNOSIS — R1013 Epigastric pain: Secondary | ICD-10-CM | POA: Diagnosis not present

## 2020-05-31 DIAGNOSIS — R197 Diarrhea, unspecified: Secondary | ICD-10-CM | POA: Diagnosis not present

## 2020-05-31 DIAGNOSIS — I10 Essential (primary) hypertension: Secondary | ICD-10-CM | POA: Diagnosis present

## 2020-05-31 DIAGNOSIS — Z79899 Other long term (current) drug therapy: Secondary | ICD-10-CM | POA: Diagnosis not present

## 2020-05-31 DIAGNOSIS — Z79890 Hormone replacement therapy (postmenopausal): Secondary | ICD-10-CM | POA: Diagnosis not present

## 2020-05-31 DIAGNOSIS — R933 Abnormal findings on diagnostic imaging of other parts of digestive tract: Secondary | ICD-10-CM | POA: Diagnosis not present

## 2020-05-31 DIAGNOSIS — K56600 Partial intestinal obstruction, unspecified as to cause: Secondary | ICD-10-CM | POA: Diagnosis not present

## 2020-05-31 DIAGNOSIS — J449 Chronic obstructive pulmonary disease, unspecified: Secondary | ICD-10-CM | POA: Diagnosis present

## 2020-05-31 DIAGNOSIS — Z85820 Personal history of malignant melanoma of skin: Secondary | ICD-10-CM | POA: Diagnosis not present

## 2020-05-31 DIAGNOSIS — G250 Essential tremor: Secondary | ICD-10-CM | POA: Diagnosis present

## 2020-05-31 DIAGNOSIS — Z885 Allergy status to narcotic agent status: Secondary | ICD-10-CM

## 2020-05-31 DIAGNOSIS — Z20822 Contact with and (suspected) exposure to covid-19: Secondary | ICD-10-CM | POA: Diagnosis present

## 2020-05-31 DIAGNOSIS — Z85828 Personal history of other malignant neoplasm of skin: Secondary | ICD-10-CM | POA: Diagnosis not present

## 2020-05-31 DIAGNOSIS — K219 Gastro-esophageal reflux disease without esophagitis: Secondary | ICD-10-CM | POA: Diagnosis present

## 2020-05-31 DIAGNOSIS — Z86010 Personal history of colonic polyps: Secondary | ICD-10-CM

## 2020-05-31 DIAGNOSIS — E785 Hyperlipidemia, unspecified: Secondary | ICD-10-CM | POA: Diagnosis present

## 2020-05-31 DIAGNOSIS — R109 Unspecified abdominal pain: Secondary | ICD-10-CM | POA: Diagnosis not present

## 2020-05-31 DIAGNOSIS — R1031 Right lower quadrant pain: Secondary | ICD-10-CM | POA: Diagnosis not present

## 2020-05-31 LAB — COMPREHENSIVE METABOLIC PANEL
ALT: 29 U/L (ref 0–44)
AST: 27 U/L (ref 15–41)
Albumin: 4.3 g/dL (ref 3.5–5.0)
Alkaline Phosphatase: 58 U/L (ref 38–126)
Anion gap: 13 (ref 5–15)
BUN: 18 mg/dL (ref 8–23)
CO2: 22 mmol/L (ref 22–32)
Calcium: 10.2 mg/dL (ref 8.9–10.3)
Chloride: 99 mmol/L (ref 98–111)
Creatinine, Ser: 0.87 mg/dL (ref 0.44–1.00)
GFR, Estimated: >60 mL/min (ref >60)
Glucose, Bld: 128 mg/dL — ABNORMAL HIGH (ref 70–99)
Potassium: 4 mmol/L (ref 3.5–5.1)
Sodium: 134 mmol/L — ABNORMAL LOW (ref 135–145)
Total Bilirubin: 1 mg/dL (ref 0.3–1.2)
Total Protein: 8.1 g/dL (ref 6.5–8.1)

## 2020-05-31 LAB — CBC
HCT: 44.1 % (ref 36.0–46.0)
Hemoglobin: 15.5 g/dL — ABNORMAL HIGH (ref 12.0–15.0)
MCH: 29.8 pg (ref 26.0–34.0)
MCHC: 35.1 g/dL (ref 30.0–36.0)
MCV: 84.6 fL (ref 80.0–100.0)
Platelets: 327 10*3/uL (ref 150–400)
RBC: 5.21 MIL/uL — ABNORMAL HIGH (ref 3.87–5.11)
RDW: 12.4 % (ref 11.5–15.5)
WBC: 11.5 10*3/uL — ABNORMAL HIGH (ref 4.0–10.5)
nRBC: 0 % (ref 0.0–0.2)

## 2020-05-31 LAB — URINALYSIS, COMPLETE (UACMP) WITH MICROSCOPIC
Bacteria, UA: NONE SEEN
Bilirubin Urine: NEGATIVE
Glucose, UA: NEGATIVE mg/dL
Ketones, ur: NEGATIVE mg/dL
Nitrite: NEGATIVE
Protein, ur: NEGATIVE mg/dL
Specific Gravity, Urine: 1.025 (ref 1.005–1.030)
pH: 5 (ref 5.0–8.0)

## 2020-05-31 LAB — LIPASE, BLOOD: Lipase: 29 U/L (ref 11–51)

## 2020-05-31 MED ORDER — OXYCODONE-ACETAMINOPHEN 5-325 MG PO TABS: 1.0000 | ORAL_TABLET | ORAL | 0 refills | Status: DC | PRN

## 2020-05-31 MED ORDER — IOHEXOL 300 MG/ML  SOLN
100.0000 mL | Freq: Once | INTRAMUSCULAR | Status: AC | PRN
  Administered 2020-05-31: 100 mL via INTRAVENOUS
  Filled 2020-05-31: qty 100

## 2020-05-31 MED ORDER — HEPARIN SODIUM (PORCINE) 5000 UNIT/ML IJ SOLN
5000.0000 [IU] | Freq: Three times a day (TID) | INTRAMUSCULAR | Status: DC
  Administered 2020-06-01 – 2020-06-02 (×5): 5000 [IU] via SUBCUTANEOUS
  Filled 2020-05-31 (×4): qty 1

## 2020-05-31 MED ORDER — MORPHINE SULFATE (PF) 4 MG/ML IV SOLN
4.0000 mg | Freq: Once | INTRAVENOUS | Status: AC
  Administered 2020-05-31: 4 mg via INTRAVENOUS
  Filled 2020-05-31: qty 1

## 2020-05-31 MED ORDER — ACETAMINOPHEN 325 MG PO TABS
650.0000 mg | ORAL_TABLET | Freq: Four times a day (QID) | ORAL | Status: DC | PRN
  Administered 2020-06-02: 650 mg via ORAL
  Filled 2020-05-31: qty 2

## 2020-05-31 MED ORDER — ONDANSETRON 4 MG PO TBDP: 4.0000 mg | ORAL_TABLET | Freq: Three times a day (TID) | ORAL | 0 refills | Status: DC | PRN

## 2020-05-31 MED ORDER — ACETAMINOPHEN 650 MG RE SUPP: 650.0000 mg | Freq: Four times a day (QID) | RECTAL | Status: DC | PRN

## 2020-05-31 MED ORDER — ESTROGENS, CONJUGATED 0.625 MG/GM VA CREA
1.0000 | TOPICAL_CREAM | Freq: Every day | VAGINAL | Status: DC
  Administered 2020-06-01: 1 via VAGINAL
  Filled 2020-05-31: qty 30

## 2020-05-31 MED ORDER — ONDANSETRON HCL 4 MG/2ML IJ SOLN
4.0000 mg | Freq: Once | INTRAMUSCULAR | Status: AC
  Administered 2020-05-31: 4 mg via INTRAVENOUS
  Filled 2020-05-31: qty 2

## 2020-05-31 MED ORDER — LACTATED RINGERS IV BOLUS
1000.0000 mL | Freq: Once | INTRAVENOUS | Status: AC
  Administered 2020-05-31: 1000 mL via INTRAVENOUS

## 2020-05-31 MED ORDER — HYDRALAZINE HCL 20 MG/ML IJ SOLN: 10.0000 mg | Freq: Four times a day (QID) | INTRAMUSCULAR | Status: DC | PRN

## 2020-05-31 MED ORDER — BENZOCAINE 20 % MT AERO
INHALATION_SPRAY | Freq: Two times a day (BID) | OROMUCOSAL | Status: DC | PRN
  Filled 2020-05-31: qty 57

## 2020-05-31 MED ORDER — LACTATED RINGERS IV SOLN: INTRAVENOUS | Status: DC

## 2020-05-31 MED ORDER — ALBUTEROL SULFATE (2.5 MG/3ML) 0.083% IN NEBU: 2.5000 mg | INHALATION_SOLUTION | RESPIRATORY_TRACT | Status: DC | PRN

## 2020-05-31 MED ORDER — SODIUM CHLORIDE 0.9 % IV SOLN
1.0000 g | INTRAVENOUS | Status: DC
  Administered 2020-06-01 (×2): 1 g via INTRAVENOUS
  Filled 2020-05-31: qty 1
  Filled 2020-05-31 (×2): qty 10

## 2020-05-31 NOTE — ED Notes (Signed)
Pt has concerns for going home and would like to talk with the ED provider. ER provider notified and he stated he will go and talk with the pt

## 2020-05-31 NOTE — H&P (Addendum)
Chief Complaint: I have abdominal pain and distention of several days. HPI:  [name] is an 80 y.o. female with history of non-Hodgkin's lymphoma in remission for the past 7 years.  Other significant diagnoses include GERD with Barrett's esophagus, history of small bowel obstruction in the past, benign essential tremor history of colonic polyps status post recent EGD and colonoscopy on 05/14/2020.  Patient has been in her usual state of health until post her recent procedure, she started developing recurrent abdominal pain symptoms now associated with abdominal distention and a feeling of bloating.  Pain is described as diffuse 7/10 in its maximal intensity.  Pain has been intermittent but has increased in intensity and frequency over the past 2 days.  As per patient, his symptoms were consistent with her experience in 2013 when she was diagnosed with lymphoma.  On presentation, patient was given morphine for pain control.  CT scan done did reveal evidence of low-grade small bowel obstruction with a transitional zone.  Patient was referred to hospitalist service for admission and further management.  Patient denies any nausea or vomiting at this time.  Denies passing flatus but admits to watery diarrhea.  Denied any sick contacts.  Denies any shortness of breath or chest pain.  She is up-to-date with her COVID-19 vaccine..  Past Medical History:  Diagnosis Date  . Allergic rhinitis   . Allergy    seasonal  . Arthritis    degenerative of hip right  . Barrett esophagus   . Barrett esophagus   . Benign essential tremor   . Chronic bronchitis ([place])   . GERD (gastroesophageal reflux disease)   . History of colonic polyps   . 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 Hyperlipidemia   . Hypertension   . Melanoma ([place])   . Migraine   . 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 ([place]) 09/13/2014  . Non Hodgkin's lymphoma ([place])   . Non Hodgkin's lymphoma ([place])   . RAD  (reactive airway disease)   . Skin cancer     Past Surgical History:  Procedure Laterality Date  . ABDOMINAL HYSTERECTOMY    . APPENDECTOMY    . BASAL CELL CARCINOMA EXCISION     on both sides of nose  . BREAST BIOPSY Left 12/2013   hyperplasia  . CHOLECYSTECTOMY    . COLONOSCOPY WITH PROPOFOL N/A 06/05/2017   Procedure: COLONOSCOPY WITH PROPOFOL;  Surgeon: Lollie Sails, MD;  Location: [facility] ENDOSCOPY;  Service: Endoscopy;  Laterality: N/A;  . COLONOSCOPY WITH PROPOFOL N/A 05/14/2020   Procedure: COLONOSCOPY WITH PROPOFOL;  Surgeon: Lesly Rubenstein, MD;  Location: [facility] ENDOSCOPY;  Service: Endoscopy;  Laterality: N/A;  . ESOPHAGOGASTRODUODENOSCOPY (EGD) WITH PROPOFOL N/A 03/30/2015   Procedure: ESOPHAGOGASTRODUODENOSCOPY (EGD) WITH PROPOFOL;  Surgeon: Lollie Sails, MD;  Location: [facility] ENDOSCOPY;  Service: Endoscopy;  Laterality: N/A;  . ESOPHAGOGASTRODUODENOSCOPY (EGD) WITH PROPOFOL N/A 06/05/2017   Procedure: ESOPHAGOGASTRODUODENOSCOPY (EGD) WITH PROPOFOL;  Surgeon: Lollie Sails, MD;  Location: [facility] ENDOSCOPY;  Service: Endoscopy;  Laterality: N/A;  . ESOPHAGOGASTRODUODENOSCOPY (EGD) WITH PROPOFOL N/A 05/14/2020   Procedure: ESOPHAGOGASTRODUODENOSCOPY (EGD) WITH PROPOFOL;  Surgeon: Lesly Rubenstein, MD;  Location: [facility] ENDOSCOPY;  Service: Endoscopy;  Laterality: N/A;  . FACIAL COSMETIC SURGERY    . history of multiple colonic polyps    . lymph node removal from left groin    . OOPHORECTOMY    . VENTRAL HERNIA REPAIR N/A 10/02/2014   Procedure: HERNIA  REPAIR VENTRAL ADULT;  Surgeon: Leonie Green, MD;  Location: [facility] ORS;  Service: General;  Laterality: N/A;  with mesh    Family History  Problem Relation Age of Onset  . Alcohol abuse Father   . Stroke Father   . Diabetes Sister   . Kidney cancer Sister   . Melanoma Brother   . Diabetes Brother   . Heart attack Brother   . Breast cancer Neg Hx   . Bladder Cancer Neg Hx   . Prostate cancer Neg Hx     Social History:  reports that she has never smoked. She has never used smokeless tobacco. She reports previous alcohol use of about 1.0 standard drink of alcohol per week. She reports that she does not use drugs.  Allergies:  Allergies  Allergen Reactions  . Buprenorphine Hcl Nausea And Vomiting  . Morphine And Related Nausea And Vomiting    (Not in a hospital admission)   Results for orders placed or performed during the hospital encounter of 05/31/20 (from the past 48 hour(s))  Lipase, blood     Status: None   Collection Time: 05/31/20  4:51 PM  Result Value Ref Range   Lipase 29 11 - 51 U/L    Comment: Performed at [facility], [address] [place], [place] [address]  Comprehensive metabolic panel     Status: Abnormal   Collection Time: 05/31/20  4:51 PM  Result Value Ref Range   Sodium 134 (L) 135 - 145 mmol/L   Potassium 4.0 3.5 - 5.1 mmol/L   Chloride 99 98 - 111 mmol/L   CO2 22 22 - 32 mmol/L   Glucose, Bld 128 (H) 70 - 99 mg/dL    Comment: Glucose reference range applies only to samples taken after fasting for at least 8 hours.   BUN 18 8 - 23 mg/dL   Creatinine, Ser 0.87 0.44 - 1.00 mg/dL   Calcium 10.2 8.9 - 10.3 mg/dL   Total Protein 8.1 6.5 - 8.1 g/dL   Albumin 4.3 3.5 - 5.0 g/dL   AST 27 15 - 41 U/L   ALT 29 0 - 44 U/L   Alkaline Phosphatase 58 38 - 126 U/L   Total Bilirubin 1.0 0.3 - 1.2 mg/dL   GFR, Estimated >60 >60 mL/min    Comment: (NOTE) Calculated using the CKD-EPI Creatinine Equation (2021)    Anion gap 13 5 - 15    Comment: Performed at [facility], [address] [place], [place] [address]  CBC     Status: Abnormal   Collection Time: 05/31/20  4:51 PM  Result Value Ref Range   WBC 11.5 (H) 4.0 - 10.5 K/uL   RBC 5.21 (H) 3.87 - 5.11 MIL/uL   Hemoglobin 15.5 (H) 12.0 - 15.0 g/dL   HCT 44.1 36.0 - 46.0 %   MCV 84.6 80.0 - 100.0 fL   MCH 29.8 26.0 - 34.0 pg   MCHC 35.1 30.0 - 36.0 g/dL   RDW 12.4 11.5 - 15.5 %    Platelets 327 150 - 400 K/uL   nRBC 0.0 0.0 - 0.2 %    Comment: Performed at [facility], [address] [place],  [address]   CT Abdomen Pelvis W Contrast  Result Date: 05/31/2020 CLINICAL DATA:  Abdominal pain and nausea for several days. History of non-Hodgkin's lymphoma. EXAM: CT ABDOMEN AND PELVIS WITH CONTRAST TECHNIQUE: Multidetector CT imaging of the abdomen and pelvis was performed using the standard protocol  following bolus administration of intravenous contrast. CONTRAST:  141mL OMNIPAQUE IOHEXOL 300 MG/ML  SOLN COMPARISON:  Ultrasound 09/03/2019.  CT 11/14/2018 FINDINGS: Lower chest: Dependent changes in the lung bases. Fluid in the nondistended esophagus may indicate reflux or dysmotility. Hepatobiliary: Multiple circumscribed low-attenuation lesions demonstrated throughout the liver, largest measuring 4.1 cm diameter. No change since prior study. Appearance is consistent with benign cysts. Mild diffuse fatty infiltration of the liver. Gallbladder is surgically absent. No bile duct dilatation. Pancreas: Unremarkable. No pancreatic ductal dilatation or surrounding inflammatory changes. Spleen: Normal in size without focal abnormality. Adrenals/Urinary Tract: Adrenal glands are unremarkable. Kidneys are normal, without renal calculi, focal lesion, or hydronephrosis. Bladder is decompressed, likely accounting for bladder wall thickening. Stomach/Bowel: Stomach is fluid-filled without wall thickening. Mildly distended fluid-filled small bowel with air-fluid levels. Terminal ileum is decompressed. Transition zone appears to be in the right mid abdomen. No wall thickening or infiltration. Appearances are consistent with low grade small bowel obstruction. Colon is mostly decompressed. No wall thickening or inflammatory changes. Fluid-filled colon indicates diarrhea. Appendix is not identified. Vascular/Lymphatic: Aortic atherosclerosis. No enlarged abdominal or pelvic lymph nodes.  Reproductive: Status post hysterectomy. No adnexal masses. Other: Small amount of free fluid in the pelvis is likely reactive. No free air. Abdominal wall musculature appears intact. Musculoskeletal: Vertebral hemangiomas. IMPRESSION: 1. Low grade small bowel obstruction with transition zone in the right mid abdomen. 2. Fluid-filled colon indicates diarrhea. 3. Small amount of free fluid in the pelvis is likely reactive. 4. Multiple benign-appearing hepatic cysts. 5. Fluid in the nondistended esophagus may indicate reflux or dysmotility. 6. Aortic atherosclerosis. Aortic Atherosclerosis (ICD10-I70.0). Electronically Signed   By: Lucienne Capers M.D.   On: 05/31/2020 20:18    Review of Systems  Constitutional: Positive for appetite change and diaphoresis. Negative for activity change, chills and fever.  HENT: Negative.   Eyes: Negative.   Respiratory: Negative.   Cardiovascular: Negative.   Gastrointestinal: Positive for abdominal distention, abdominal pain, diarrhea and nausea. Negative for blood in stool and constipation.  Endocrine: Negative.   Genitourinary: Negative.   Musculoskeletal: Negative.   Psychiatric/Behavioral: Negative.     Blood pressure 125/90, pulse 68, temperature 98.2 F (36.8 C), temperature source Oral, resp. rate 20, height 5\' 2"  (1.575 m), weight 79.4 kg, SpO2 98 %. Physical Exam Vitals and nursing note reviewed.  Constitutional:      Appearance: She is well-developed. She is obese.  Eyes:     Pupils: Pupils are equal, round, and reactive to light.  Cardiovascular:     Rate and Rhythm: Normal rate and regular rhythm.     Heart sounds: Normal heart sounds.  Pulmonary:     Effort: Pulmonary effort is normal.     Breath sounds: Normal breath sounds.  Abdominal:     General: Abdomen is protuberant. Bowel sounds are increased. There is distension and abdominal bruit.     Palpations: There is mass. There is no hepatomegaly.     Tenderness: There is generalized  abdominal tenderness.     Hernia: No hernia is present.  Skin:    General: Skin is warm and dry.  Neurological:     General: No focal deficit present.     Mental Status: She is alert.      Assessment/Plan  Acute low-grade small bowel obstruction: Due to mild abdominal distention and symptoms, patient could benefit from NG tube placement for decompression.  Will consult with general surgery for input.  Patient follows up with Dr. Haig Prophet and  status post recent EGD and colonoscopy.  Pathology results pending.  Patient will be adequately hydrated during the course of stay with Ringer's lactate.  Serial abdominal x-rays will be advised.  General surgery input may be warranted if no improvement in a.m.  Status post recent colonoscopy and EGD on 01/07: Patient follows up with Dr. Haig Prophet.  GI follow-up  Hypertension: Blood pressure stable.  Patient is n.p.o. at this time.  Will hold off on antihypertensive medication.  UTI- POA. Follow up Urine cultures. Rocephin was initiated   [place], MD 05/31/2020, 10:45 PM

## 2020-05-31 NOTE — ED Provider Notes (Addendum)
[facility] Emergency Department Provider Note   ____________________________________________   Event Date/Time   First MD Initiated Contact with Patient 05/31/20 1921     (approximate)  I have reviewed the triage vital signs and the nursing notes.   HISTORY  Chief Complaint Abdominal Pain    HPI [name] is a 80 y.o. female with past medical history of hypertension, hyperlipidemia, COPD, GERD, Barrett's esophagus, and non-Hodgkin's lymphoma who presents to the ED complaining of abdominal pain.  Patient reports that she had spaghetti for dinner 2 nights ago, after which she started feeling bloated with diffuse pain in her abdomen.  Since then, her abdomen has felt increasingly distended and she has been feeling nauseous, but has not vomited.  She has been passing small amounts of liquid stool, but has not had any significant bowel movement.  She denies any blood in her stool.  She states that symptoms feel similar to when she was diagnosed with non-Hodgkin's lymphoma back in 2013.  She denies any fevers, dysuria, cough, chest pain, or shortness of breath.        Past Medical History:  Diagnosis Date  . Allergic rhinitis   . Allergy    seasonal  . Arthritis    degenerative of hip right  . Barrett esophagus   . Barrett esophagus   . Benign essential tremor   . Chronic bronchitis ([place])   . GERD (gastroesophageal reflux disease)   . History of colonic polyps   . 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 Hyperlipidemia   . Hypertension   . Melanoma ([place])   . Migraine   . 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 ([place]) 09/13/2014  . Non Hodgkin's lymphoma ([place])   . Non Hodgkin's lymphoma ([place])   . RAD (reactive airway disease)   . Skin cancer     Patient Active Problem List   Diagnosis Date Noted  . 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 Ingrown toenail 08/03/2015  . S/P nail surgery 08/03/2015  . Mammogram abnormal 12/29/2014  . Screening mammogram for high-risk patient 12/29/2014  . 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 ([place]) 09/13/2014  . Allergic rhinitis 06/02/2014  . Barrett esophagus 06/02/2014  . Benign essential tremor 06/02/2014  . Bronchitis, chronic ([place]) 06/02/2014  . Arthritis, degenerative 06/02/2014  . Acid reflux 06/02/2014  . History of colon polyps 06/02/2014  . Lymphoma, non-Hodgkin's ([place]) 06/02/2014  . RAD (reactive airway disease) 06/02/2014  . 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 Chronic bronchitis ([place]) 06/02/2014  . Non-Hodgkin's lymphoma ([place]) 06/02/2014  . Gastro-esophageal reflux disease without esophagitis 06/02/2014  . HLD (hyperlipidemia) 12/10/2013  . 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 ABDOMINAL HYSTERECTOMY    . APPENDECTOMY    . BASAL CELL CARCINOMA EXCISION     on both sides of nose  . BREAST BIOPSY Left 12/2013   hyperplasia  . CHOLECYSTECTOMY    . COLONOSCOPY WITH PROPOFOL N/A 06/05/2017   Procedure: COLONOSCOPY WITH PROPOFOL;  Surgeon: Lollie Sails, MD;  Location[facility] ENDOSCOPY;  Service: Endoscopy;  Laterality: N/A;  . COLONOSCOPY WITH PROPOFOL N/A 05/14/2020   Procedure: COLONOSCOPY WITH PROPOFOL;  Surgeon: Lesly Rubenstein, MD;  Location: [facility] ENDOSCOPY;  Service: Endoscopy;  Laterality: N/A;  . ESOPHAGOGASTRODUODENOSCOPY (EGD) WITH PROPOFOL N/A 03/30/2015   Procedure: ESOPHAGOGASTRODUODENOSCOPY (EGD) WITH PROPOFOL;  Surgeon: Lollie Sails, MD;  Location: [facility] ENDOSCOPY;  Service: Endoscopy;  Laterality: N/A;  . ESOPHAGOGASTRODUODENOSCOPY (EGD) WITH PROPOFOL N/A 06/05/2017   Procedure: ESOPHAGOGASTRODUODENOSCOPY (EGD) WITH PROPOFOL;  Surgeon: Lollie Sails, MD;   Location: [facility] ENDOSCOPY;  Service: Endoscopy;  Laterality: N/A;  . ESOPHAGOGASTRODUODENOSCOPY (EGD) WITH PROPOFOL N/A 05/14/2020   Procedure: ESOPHAGOGASTRODUODENOSCOPY (EGD) WITH PROPOFOL;  Surgeon: Lesly Rubenstein, MD;  Location: [facility] ENDOSCOPY;  Service: Endoscopy;  Laterality: N/A;  . FACIAL COSMETIC SURGERY    . history of multiple colonic polyps    . lymph node removal from left groin    . OOPHORECTOMY    . VENTRAL HERNIA REPAIR N/A 10/02/2014   Procedure: HERNIA REPAIR VENTRAL ADULT;  Surgeon: Leonie Green, MD;  Location: [facility] ORS;  Service: General;  Laterality: N/A;  with mesh    Prior to Admission medications   Medication Sig Start Date End Date Taking? Authorizing Provider  ondansetron (ZOFRAN ODT) 4 MG disintegrating tablet Take 1 tablet (4 mg total) by mouth every 8 (eight) hours as needed for nausea or vomiting. 05/31/20  Yes Blake Divine, MD  oxyCODONE-acetaminophen (PERCOCET) 5-325 MG tablet Take 1 tablet by mouth every 4 (four) hours as needed for severe pain. 05/31/20 05/31/21 Yes Blake Divine, MD  aspirin EC 81 MG tablet Take 81 mg by mouth. Patient not taking: Reported on 05/14/2020    [provider]  Calcium Carbonate-Vit D-Min (CALCIUM 1200 PO) Take 1 tablet by mouth 1 day or 1 dose.    [provider]  Cholecalciferol (VITAMIN D3) 25 MCG (1000 UT) CAPS Take 1 capsule by mouth 1 day or 1 dose.    [provider]  conjugated estrogens (PREMARIN) vaginal cream Place 1 Applicatorful vaginally daily.    [provider]  lisinopril (PRINIVIL,ZESTRIL) 10 MG tablet  09/21/16   [provider]  loperamide (IMODIUM A-D) 2 MG tablet Take 2 mg by mouth 4 (four) times daily as needed for diarrhea or loose stools.    [provider]  loratadine (CLARITIN) 10 MG tablet Take 10 mg by mouth every morning.     [provider]  omeprazole (PRILOSEC) 20 MG capsule  06/08/15   [provider]  psyllium  (METAMUCIL) 58.6 % packet Take 1 packet by mouth daily.    [provider]    Allergies Buprenorphine hcl and Morphine and related  Family History  Problem Relation Age of Onset  . Alcohol abuse Father   . Stroke Father   . Diabetes Sister   . Kidney cancer Sister   . Melanoma Brother   . Diabetes Brother   . Heart attack Brother   . Breast cancer Neg Hx   . Bladder Cancer Neg Hx   . Prostate cancer Neg Hx     Social History Social History   Tobacco Use  . Smoking status: Never Smoker  . Smokeless tobacco: Never Used  Vaping Use  . Vaping Use: Never used  Substance Use Topics  . Alcohol use: Not Currently    Alcohol/week: 1.0 standard drink    Types: 1 Cans of beer per week    Comment: rare social occasions only  . Drug use: Never    Review of Systems  Constitutional: No fever/chills Eyes: No visual changes. ENT: No sore throat. Cardiovascular: Denies chest pain. Respiratory: Denies shortness of breath. Gastrointestinal: Positive  for abdominal pain and nausea, no vomiting.  No diarrhea.  No constipation. Genitourinary: Negative for dysuria. Musculoskeletal: Negative for back pain. Skin: Negative for rash. Neurological: Negative for headaches, focal weakness or numbness.  ____________________________________________   PHYSICAL EXAM:  VITAL SIGNS: ED Triage Vitals [05/31/20 1647]  Enc Vitals Group     BP 128/75     Pulse Rate 89     Resp 18     Temp 98 F (36.7 C)     Temp src      SpO2 100 %     Weight 175 lb (79.4 kg)     Height 5\' 2"  (1.575 m)     Head Circumference      Peak Flow      Pain Score 10     Pain Loc      Pain Edu?      Excl. in [place]?     Constitutional: Alert and oriented. Eyes: Conjunctivae are normal. Head: Atraumatic. Nose: No congestion/rhinnorhea. Mouth/Throat: Mucous membranes are moist. Neck: Normal ROM Cardiovascular: Normal rate, regular rhythm. Grossly normal heart sounds.  2+ radial pulses  bilaterally. Respiratory: Normal respiratory effort.  No retractions. Lungs CTAB. Gastrointestinal: Soft and diffusely tender to palpation, also diffusely distended with no fluid wave. Genitourinary: deferred Musculoskeletal: No lower extremity tenderness nor edema. Neurologic:  Normal speech and language. No gross focal neurologic deficits are appreciated. Skin:  Skin is warm, dry and intact. No rash noted. Psychiatric: Mood and affect are normal. Speech and behavior are normal.  ____________________________________________   LABS (all labs ordered are listed, but only abnormal results are displayed)  Labs Reviewed  COMPREHENSIVE METABOLIC PANEL - Abnormal; Notable for the following components:      Result Value   Sodium 134 (*)    Glucose, Bld 128 (*)    All other components within normal limits  CBC - Abnormal; Notable for the following components:   WBC 11.5 (*)    RBC 5.21 (*)    Hemoglobin 15.5 (*)    All other components within normal limits  LIPASE, BLOOD  URINALYSIS, COMPLETE (UACMP) WITH MICROSCOPIC     PROCEDURES  Procedure(s) performed (including Critical Care):  Procedures   ____________________________________________   INITIAL IMPRESSION / ASSESSMENT AND PLAN / ED COURSE       80 year old female with past medical history of hypertension, hyperlipidemia, COPD, GERD, Barrett's esophagus, and non-Hodgkin's lymphoma who presents to the ED complaining of increasing abdominal distention and pain over the past 2 days.  Patient's abdomen does appear diffusely distended with associated tenderness and we will further assess for bowel obstruction versus colitis versus recurrent malignancy with CT scan.  Labs thus far are reassuring, UA is pending.  We will treat symptomatically with morphine and Zofran, hydrate with IV fluids.  CT scan shows low-grade bowel obstruction to explain patient's symptoms.  Patient reports feeling much better following morphine and Zofran,  was offered admission to the hospital but declines.  She prefers to see how things go at home and we will prescribe Percocet as well as Zofran.  She was counseled on bowel rest with gentle liquid diet for at least the next 24 hours.  She was also counseled to return to the ED for any new or worsening symptoms, patient agrees with plan.  ----------------------------------------- 9:37 PM on 05/31/2020 -----------------------------------------  Patient now states that her pain is returning and she feels more comfortable being admitted to the hospital.  We will discuss with hospitalist for admission for small  bowel obstruction.      ____________________________________________   FINAL CLINICAL IMPRESSION(S) / ED DIAGNOSES  Final diagnoses:  Partial intestinal obstruction, unspecified cause [facility])     ED Discharge Orders         Ordered    oxyCODONE-acetaminophen (PERCOCET) 5-325 MG tablet  Every 4 hours PRN        05/31/20 2036    ondansetron (ZOFRAN ODT) 4 MG disintegrating tablet  Every 8 hours PRN        05/31/20 2036           Note:  This document was prepared using Dragon voice recognition software and may include unintentional dictation errors.   Blake Divine, MD 05/31/20 2038    Blake Divine, MD 05/31/20 2138

## 2020-05-31 NOTE — ED Notes (Signed)
Pt states abdominal pain and nausea for the last several days.

## 2020-05-31 NOTE — ED Triage Notes (Signed)
Pt comes via POV from home with c/o abdominal pain since Saturday. Pt states cold sweats and diarrhea. Pt states she had spaghetti and started to feel bloated following. Pt also states she is in remission from cancer for 8 years but feels this may be the return of that.

## 2020-06-01 ENCOUNTER — Inpatient Hospital Stay: Payer: Medicare Other

## 2020-06-01 ENCOUNTER — Encounter: Payer: Self-pay | Admitting: Internal Medicine

## 2020-06-01 DIAGNOSIS — R933 Abnormal findings on diagnostic imaging of other parts of digestive tract: Secondary | ICD-10-CM

## 2020-06-01 DIAGNOSIS — R1013 Epigastric pain: Secondary | ICD-10-CM

## 2020-06-01 DIAGNOSIS — R1031 Right lower quadrant pain: Secondary | ICD-10-CM

## 2020-06-01 DIAGNOSIS — R197 Diarrhea, unspecified: Secondary | ICD-10-CM

## 2020-06-01 DIAGNOSIS — R110 Nausea: Secondary | ICD-10-CM

## 2020-06-01 LAB — CBC
HCT: 38.2 % (ref 36.0–46.0)
Hemoglobin: 13.2 g/dL (ref 12.0–15.0)
MCH: 29.9 pg (ref 26.0–34.0)
MCHC: 34.6 g/dL (ref 30.0–36.0)
MCV: 86.4 fL (ref 80.0–100.0)
Platelets: 252 10*3/uL (ref 150–400)
RBC: 4.42 MIL/uL (ref 3.87–5.11)
RDW: 12.4 % (ref 11.5–15.5)
WBC: 8.9 10*3/uL (ref 4.0–10.5)
nRBC: 0 % (ref 0.0–0.2)

## 2020-06-01 LAB — SARS CORONAVIRUS 2 (TAT 6-24 HRS): SARS Coronavirus 2: NEGATIVE

## 2020-06-01 LAB — BASIC METABOLIC PANEL
Anion gap: 10 (ref 5–15)
BUN: 15 mg/dL (ref 8–23)
CO2: 25 mmol/L (ref 22–32)
Calcium: 9 mg/dL (ref 8.9–10.3)
Chloride: 99 mmol/L (ref 98–111)
Creatinine, Ser: 0.83 mg/dL (ref 0.44–1.00)
GFR, Estimated: >60 mL/min (ref >60)
Glucose, Bld: 135 mg/dL — ABNORMAL HIGH (ref 70–99)
Potassium: 4.1 mmol/L (ref 3.5–5.1)
Sodium: 134 mmol/L — ABNORMAL LOW (ref 135–145)

## 2020-06-01 LAB — MRSA PCR SCREENING: MRSA by PCR: NEGATIVE

## 2020-06-01 MED ORDER — MELATONIN 3 MG PO TABS
3.0000 mg | ORAL_TABLET | Freq: Every day | ORAL | Status: DC
  Administered 2020-06-01: 3 mg via ORAL
  Filled 2020-06-01 (×2): qty 1

## 2020-06-01 NOTE — Consult Note (Signed)
[facility] SURGICAL CONSULTATION NOTE (initial) - [id number]   HISTORY OF PRESENT ILLNESS (HPI):  80 y.o. female presented to [facility] ED yesterday for evaluation of abdominal pain. Patient reports the acute onset of sharp abdominal pain on Saturday evening. This was localized in her periumbilical region and RLQ. She endorsed associated nausea and watery, non-bloody, diarrhea with the pain. She thought maybe this was related to spaghetti she had eaten for dinner. She denied any fever, chills, cough, CP, SOB, or urinary changes. Her pain persisted through the weekend and prompted her presentation. She has a history of SBO in 2013 which was managed conservatively, and this feels similar but much less severe. She does have a significant surgical history including abdominal hysterectomy, cholecystectomy, appendectomy, and ventral hernia repair x2. She did recently undergo EGD and colonoscopy with Dr Haig Prophet, MD on 01/07 and I have reviewed those reports. Work up in the ED revealed mild leukocytosis to 11.5K (now resolved), Hemoconcentration with Hgb 15.5, renal function normal with sCr - 0.87, and CT Abdomen/Pelvis was concerning for mild small bowel dilation and possible low grade bowel obstruction. She was ultimately admitted to the medicine service. NGT placement was attempted x2 without success. However, this afternoon, she is reporting that she feels "much better." She now is only complaining of 2/10 RLQ soreness. No longer with nausea, and she has since passed flatus multiple times. Her abdominal XR this morning also showed improvement with gas through colon.   Surgery is consulted by hospitalist physician Dr. Shawna Clamp, MD in this context for evaluation and management of pSBO.  PAST MEDICAL HISTORY (PMH):  Past Medical History:  Diagnosis Date  . Allergic rhinitis   . Allergy    seasonal  . Arthritis    degenerative of hip right  . Barrett esophagus   . Barrett esophagus   .  Benign essential tremor   . Chronic bronchitis ([place])   . GERD (gastroesophageal reflux disease)   . History of colonic polyps   . 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 Hyperlipidemia   . Hypertension   . Melanoma ([place])   . Migraine   . 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 Non Hodgkin's lymphoma ([place])   . Non Hodgkin's lymphoma ([place])   . RAD (reactive airway disease)   . Skin cancer      PAST SURGICAL HISTORY ([place]):  Past Surgical History:  Procedure Laterality Date  . ABDOMINAL HYSTERECTOMY    . APPENDECTOMY    . BASAL CELL CARCINOMA EXCISION     on both sides of nose  . BREAST BIOPSY Left 12/2013   hyperplasia  . CHOLECYSTECTOMY    . COLONOSCOPY WITH PROPOFOL N/A 06/05/2017   Procedure: COLONOSCOPY WITH PROPOFOL;  Surgeon: Lollie Sails, MD;  Location: [facility] ENDOSCOPY;  Service: Endoscopy;  Laterality: N/A;  . COLONOSCOPY WITH PROPOFOL N/A 05/14/2020   Procedure: COLONOSCOPY WITH PROPOFOL;  Surgeon: Lesly Rubenstein, MD;  Location: [facility] ENDOSCOPY;  Service: Endoscopy;  Laterality: N/A;  . ESOPHAGOGASTRODUODENOSCOPY (EGD) WITH PROPOFOL N/A 03/30/2015   Procedure: ESOPHAGOGASTRODUODENOSCOPY (EGD) WITH PROPOFOL;  Surgeon: Lollie Sails, MD;  Location: [facility] ENDOSCOPY;  Service: Endoscopy;  Laterality: N/A;  . ESOPHAGOGASTRODUODENOSCOPY (EGD) WITH PROPOFOL N/A 06/05/2017   Procedure: ESOPHAGOGASTRODUODENOSCOPY (EGD) WITH PROPOFOL;  Surgeon: Lollie Sails, MD;  Location: [facility] ENDOSCOPY;  Service: Endoscopy;  Laterality: N/A;  . ESOPHAGOGASTRODUODENOSCOPY (EGD) WITH PROPOFOL N/A 05/14/2020   Procedure: ESOPHAGOGASTRODUODENOSCOPY (EGD)  WITH PROPOFOL;  Surgeon: Lesly Rubenstein, MD;  Location: [facility] ENDOSCOPY;  Service: Endoscopy;  Laterality: N/A;  . FACIAL COSMETIC SURGERY    . history of multiple colonic polyps    . lymph node removal from left groin    . OOPHORECTOMY    . VENTRAL HERNIA REPAIR N/A 10/02/2014    Procedure: HERNIA REPAIR VENTRAL ADULT;  Surgeon: Leonie Green, MD;  Location: [facility] ORS;  Service: General;  Laterality: N/A;  with mesh     MEDICATIONS:  Prior to Admission medications   Medication Sig Start Date End Date Taking? Authorizing Provider  Calcium Carbonate-Vit D-Min (CALCIUM 1200 PO) Take 1 tablet by mouth 1 day or 1 dose.   Yes [provider]  Cholecalciferol (VITAMIN D3 GUMMIES) 25 MCG (1000 UT) CHEW Chew 1,000 Units by mouth daily.   Yes [provider]  conjugated estrogens (PREMARIN) vaginal cream Place 1 Applicatorful vaginally daily.   Yes [provider]  lisinopril (PRINIVIL,ZESTRIL) 10 MG tablet Take 10 mg by mouth daily. 09/21/16  Yes [provider]  loperamide (IMODIUM A-D) 2 MG tablet Take 2 mg by mouth 4 (four) times daily as needed for diarrhea or loose stools.   Yes [provider]  loratadine (CLARITIN) 10 MG tablet Take 10 mg by mouth every morning.    Yes [provider]  omeprazole (PRILOSEC) 20 MG capsule Take 20 mg by mouth daily. 06/08/15  Yes [provider]  ondansetron (ZOFRAN ODT) 4 MG disintegrating tablet Take 1 tablet (4 mg total) by mouth every 8 (eight) hours as needed for nausea or vomiting. 05/31/20  Yes Blake Divine, MD  oxyCODONE-acetaminophen (PERCOCET) 5-325 MG tablet Take 1 tablet by mouth every 4 (four) hours as needed for severe pain. 05/31/20 05/31/21 Yes Blake Divine, MD     ALLERGIES:  Allergies  Allergen Reactions  . Buprenorphine Hcl Nausea And Vomiting  . Morphine And Related Nausea And Vomiting     SOCIAL HISTORY:  Social History   Socioeconomic History  . Marital status: Married    Spouse name: Not on file  . Number of children: Not on file  . Years of education: Not on file  . Highest education level: Not on file  Occupational History  . Not on file  Tobacco Use  . Smoking status: Never Smoker  . Smokeless tobacco: Never Used  Vaping Use  .  Vaping Use: Never used  Substance and Sexual Activity  . Alcohol use: Not Currently    Alcohol/week: 1.0 standard drink    Types: 1 Cans of beer per week    Comment: rare social occasions only  . Drug use: Never  . Sexual activity: Never  Other Topics Concern  . Not on file  Social History Narrative  . Not on file   Social Determinants of Health   Financial Resource Strain: Not on file  Food Insecurity: Not on file  Transportation Needs: Not on file  Physical Activity: Not on file  Stress: Not on file  Social Connections: Not on file  Intimate Partner Violence: Not on file     FAMILY HISTORY:  Family History  Problem Relation Age of Onset  . Alcohol abuse Father   . Stroke Father   . Diabetes Sister   . Kidney cancer Sister   . Melanoma Brother   . Diabetes Brother   . Heart attack Brother   . Breast cancer Neg Hx   . Bladder Cancer Neg Hx   .  Prostate cancer Neg Hx       REVIEW OF SYSTEMS:  Review of Systems  Constitutional: Negative for chills and fever.  HENT: Negative for congestion and sore throat.   Respiratory: Negative for cough and shortness of breath.   Cardiovascular: Negative for chest pain and palpitations.  Gastrointestinal: Positive for abdominal pain, diarrhea and nausea. Negative for blood in stool, constipation and vomiting.  Genitourinary: Negative for dysuria and urgency.  All other systems reviewed and are negative.   VITAL SIGNS:  Temp:  [98 F (36.7 C)-98.2 F (36.8 C)] 98.2 F (36.8 C) (01/25 1230) Pulse Rate:  [68-89] 80 (01/25 1230) Resp:  [16-20] 16 (01/25 1230) BP: (115-137)/(65-90) 116/86 (01/25 1230) SpO2:  [93 %-100 %] 97 % (01/25 1230) Weight:  [79.4 kg] 79.4 kg (01/24 1647)     Height: 5\' 2"  (157.5 cm) Weight: 79.4 kg BMI (Calculated): 32   INTAKE/OUTPUT:  01/24 0701 - 01/25 0700 In: 1000 [IV Piggyback:1000] Out: -   PHYSICAL EXAM:  Physical Exam Vitals and nursing note reviewed. Exam conducted with a chaperone  present.  Constitutional:      General: She is not in acute distress.    Appearance: She is well-developed. She is obese. She is not ill-appearing.  HENT:     Head: Normocephalic and atraumatic.  Eyes:     General: No scleral icterus.    Extraocular Movements: Extraocular movements intact.  Cardiovascular:     Rate and Rhythm: Normal rate.     Heart sounds: Normal heart sounds.  Pulmonary:     Effort: Pulmonary effort is normal.     Breath sounds: No wheezing.  Abdominal:     General: Abdomen is protuberant. A surgical scar is present. There is no distension.     Palpations: Abdomen is soft.     Tenderness: There is no abdominal tenderness. There is no guarding or rebound.     Comments: Abdomen is protuberant consistent with obesity, soft, no gross tenderness, non-distended, no rebound/guarding. Multiple previous surgical scars seen   Genitourinary:    Comments: Deferred Skin:    General: Skin is warm and dry.     Coloration: Skin is not jaundiced or pale.  Neurological:     General: No focal deficit present.     Mental Status: She is alert and oriented to person, place, and time.  Psychiatric:        Mood and Affect: Mood normal.        Behavior: Behavior normal.      Labs:  CBC Latest Ref Rng & Units 06/01/2020 05/31/2020 02/19/2020  WBC 4.0 - 10.5 K/uL 8.9 11.5(H) 6.1  Hemoglobin 12.0 - 15.0 g/dL 13.2 15.5(H) 14.3  Hematocrit 36.0 - 46.0 % 38.2 44.1 40.6  Platelets 150 - 400 K/uL 252 327 244   CMP Latest Ref Rng & Units 06/01/2020 05/31/2020 02/19/2020  Glucose 70 - 99 mg/dL 135(H) 128(H) 110(H)  BUN 8 - 23 mg/dL 15 18 21   Creatinine 0.44 - 1.00 mg/dL 0.83 0.87 0.92  Sodium 135 - 145 mmol/L 134(L) 134(L) 139  Potassium 3.5 - 5.1 mmol/L 4.1 4.0 3.9  Chloride 98 - 111 mmol/L 99 99 107  CO2 22 - 32 mmol/L 25 22 25   Calcium 8.9 - 10.3 mg/dL 9.0 10.2 9.3  Total Protein 6.5 - 8.1 g/dL - 8.1 7.1  Total Bilirubin 0.3 - 1.2 mg/dL - 1.0 0.7  Alkaline Phos 38 - 126 U/L - 58  54  AST 15 - 41 U/L -  27 35  ALT 0 - 44 U/L - 29 39    Imaging studies:   CT Abdomen/Pelvis (05/31/2020) personally reviewed with mildly distended loops of small bowel and stomach concerning for bowel obstruction, and radiologist report reviewed: IMPRESSION: 1. Low grade small bowel obstruction with transition zone in the right mid abdomen. 2. Fluid-filled colon indicates diarrhea. 3. Small amount of free fluid in the pelvis is likely reactive. 4. Multiple benign-appearing hepatic cysts. 5. Fluid in the nondistended esophagus may indicate reflux or dysmotility. 6. Aortic atherosclerosis.    KUB (06/01/2020) personally reviewed showing dilated loops in the RLQ but air seen throughout the colon, and radiologist report reviewed:  IMPRESSION: Slightly dilated loops of small bowel again noted. Colon is nondistended. No free air.   Assessment/Plan: (ICD-10's: K69.609) 80 y.o. female initially presenting with abdominal pain, nausea, and diarrhea found to have possible partial SBO vs gastroenteritis, now clinically improved with evidence of bowel function return.   - Given her significant improvement this afternoon, I think it would be reasonable to forgo NGT placement  - I will give her CLD this evening and can ADAT   - No need for surgical intervention  - Wean from IVF resuscitation as diet advances  - Monitor abdominal examination; on-going bowel function  - Pain control prn (minimize narcotics); antiemetics prn  - Mobilization as tolerated  - Morning KUB   - Further management per primary service; we will follow    - Discharge Planning: If tolerates advancement of diet, can consider discharge in next 24 hours  All of the above findings and recommendations were discussed with the patient, and all of patient's questions were answered to her expressed satisfaction.  Thank you for the opportunity to participate in this patient's care.   -- Edison Simon, PA-C [facility] 06/01/2020, 2:15 PM [phone] M-F: 7am - 4pm

## 2020-06-01 NOTE — ED Notes (Signed)
Spoke with surgery, they will change diet orders to clears.   No surgical interventions at this time.  OK with no placement of NG tube.

## 2020-06-01 NOTE — ED Notes (Signed)
NG tube insertion was attempted again and was unsuccessful. Charge nurse Nira Conn was notified

## 2020-06-01 NOTE — ED Notes (Signed)
NG tube placement unsuccessful by nursing staff at this time

## 2020-06-01 NOTE — ED Notes (Signed)
Pt states she has starting passing a lot of gas.  Audible bowel sounds present in all 4 quadrants. Will continue to monitor.

## 2020-06-01 NOTE — Progress Notes (Signed)
PROGRESS NOTE    [name]  [id number] DOB: 03/18/1941 DOA: 05/31/2020 PCP: Marinda Elk, MD    Brief Narrative:  This 80 years old female with PMH significant for non-Hodgkin's lymphoma in remission for last 7 years, GERD, Barrett's esophagus, history of small bowel obstruction in the past, benign essential tremor, history of colonic polyps s/p recent EGD and colonoscopy on 05/14/2020.  Patient reports sudden onset of abdominal pain associated with nausea and bloating.  She denies any vomiting.  Pain was severe so she presented in the ED.  CT abdomen reveals evidence of low-grade small bowel obstruction with a transition zone.  Patient is admitted for partial small bowel obstruction.  Started on IV fluids,  kept her n.p.o. General surgery consulted.  Patient has passed flatus,  reports abdominal pain is slightly improved.  General surgery recommended observation for 24 hours,  repeat x-ray in the morning if she tolerates diet.  Assessment & Plan:   Active Problems:   SBO (small bowel obstruction) (HCC)  Acute abdominal pain secondary to small bowel obstruction: Patient presented with abdominal pain associated with distention and bloating. CT abdomen shows evidence of partial small bowel obstruction. NPO, IV fluids.  NG attempted several times,  not successful. Patient has past flatus, general surgery consulted. Started patient on clear liquid diet, advance diet as tolerated. Repeat x-ray in the morning,  observe for 24 hours. If she tolerates diet,  abdominal pain resolves, she can be discharged.  S/p recent EGD and colonoscopy: Patient follows up with Dr. Haig Prophet. Outpatient GI follow-up  Hypertension: Hydralazine IV as needed.  UTI : Continue Rocephin for 3 days. Follow-up urine cultures  DVT prophylaxis: Lovenox Code Status: Full code Family Communication: No family at bedside Disposition Plan:   Status is: Inpatient  Remains inpatient appropriate  because:Inpatient level of care appropriate due to severity of illness   Dispo: The patient is from: Home              Anticipated d/c is to: Home              Anticipated d/c date is: 1 day              Patient currently is not medically stable to d/c.   Difficult to place patient No   Consultants:   Surgery  Procedures:  None Antimicrobials:  Anti-infectives (From admission, onward)   Start     Dose/Rate Route Frequency Ordered Stop   05/31/20 2315  cefTRIAXone (ROCEPHIN) 1 g in sodium chloride 0.9 % 100 mL IVPB        1 g 200 mL/hr over 30 Minutes Intravenous Every 24 hours 05/31/20 2305        Subjective: Patient was seen and examined at bedside.  Overnight events noted.  Patient reports abdominal pain has improved, she has passed some gas.  Objective: Vitals:   06/01/20 1030 06/01/20 1230 06/01/20 1442 06/01/20 1601  BP: 124/65 116/86 122/69 126/67  Pulse: 80 80 81 79  Resp: 16 16 16 17   Temp: 98.2 F (36.8 C) 98.2 F (36.8 C) 98.2 F (36.8 C) (!) 97.4 F (36.3 C)  TempSrc: Oral Oral Oral   SpO2: 95% 97% 97% 100%  Weight:    75.1 kg  Height:    5\' 2"  (1.575 m)    Intake/Output Summary (Last 24 hours) at 06/01/2020 1617 Last data filed at 05/31/2020 2133 Gross per 24 hour  Intake 1000 ml  Output --  Net 1000 ml  Filed Weights   05/31/20 1647 06/01/20 1601  Weight: 79.4 kg 75.1 kg    Examination:  General exam: Appears calm and comfortable  Respiratory system: Clear to auscultation. Respiratory effort normal. Cardiovascular system: S1 & S2 heard, RRR. No JVD, murmurs, rubs, gallops or clicks. No pedal edema. Gastrointestinal system: Abdomen is nondistended, soft and mildly tender. No organomegaly or masses felt. Normal bowel sounds heard. Central nervous system: Alert and oriented. No focal neurological deficits. Extremities: Symmetric 5 x 5 power. Skin: No rashes, lesions or ulcers Psychiatry: Judgement and insight appear normal. Mood & affect  appropriate.     Data Reviewed: I have personally reviewed following labs and imaging studies  CBC: Recent Labs  Lab 05/31/20 1651 06/01/20 0420  WBC 11.5* 8.9  HGB 15.5* 13.2  HCT 44.1 38.2  MCV 84.6 86.4  PLT 327 591   Basic Metabolic Panel: Recent Labs  Lab 05/31/20 1651 06/01/20 0420  NA 134* 134*  K 4.0 4.1  CL 99 99  CO2 22 25  GLUCOSE 128* 135*  BUN 18 15  CREATININE 0.87 0.83  CALCIUM 10.2 9.0   GFR: Estimated Creatinine Clearance: 53 mL/min (by C-G formula based on SCr of 0.83 mg/dL). Liver Function Tests: Recent Labs  Lab 05/31/20 1651  AST 27  ALT 29  ALKPHOS 58  BILITOT 1.0  PROT 8.1  ALBUMIN 4.3   Recent Labs  Lab 05/31/20 1651  LIPASE 29   No results for input(s): AMMONIA in the last 168 hours. Coagulation Profile: No results for input(s): INR, PROTIME in the last 168 hours. Cardiac Enzymes: No results for input(s): CKTOTAL, CKMB, CKMBINDEX, TROPONINI in the last 168 hours. BNP (last 3 results) No results for input(s): PROBNP in the last 8760 hours. HbA1C: No results for input(s): HGBA1C in the last 72 hours. CBG: No results for input(s): GLUCAP in the last 168 hours. Lipid Profile: No results for input(s): CHOL, HDL, LDLCALC, TRIG, CHOLHDL, LDLDIRECT in the last 72 hours. Thyroid Function Tests: No results for input(s): TSH, T4TOTAL, FREET4, T3FREE, THYROIDAB in the last 72 hours. Anemia Panel: No results for input(s): VITAMINB12, FOLATE, FERRITIN, TIBC, IRON, RETICCTPCT in the last 72 hours. Sepsis Labs: No results for input(s): PROCALCITON, LATICACIDVEN in the last 168 hours.  Recent Results (from the past 240 hour(s))  SARS CORONAVIRUS 2 (TAT 6-24 HRS) Nasopharyngeal Nasopharyngeal Swab     Status: None   Collection Time: 05/31/20  9:56 PM   Specimen: Nasopharyngeal Swab  Result Value Ref Range Status   SARS Coronavirus 2 NEGATIVE NEGATIVE Final    Comment: (NOTE) SARS-CoV-2 target nucleic acids are NOT DETECTED.  The  SARS-CoV-2 RNA is generally detectable in upper and lower respiratory specimens during the acute phase of infection. Negative results do not preclude SARS-CoV-2 infection, do not rule out co-infections with other pathogens, and should not be used as the sole basis for treatment or other patient management decisions. Negative results must be combined with clinical observations, patient history, and epidemiological information. The expected result is Negative.  Fact Sheet for Patients: SugarRoll.be  Fact Sheet for Healthcare Providers: https://www.woods-mathews.com/  This test is not yet approved or cleared by the [place] FDA and  has been authorized for detection and/or diagnosis of SARS-CoV-2 by FDA under an Emergency Use Authorization (EUA). This EUA will remain  in effect (meaning this test can be used) for the duration of the COVID-19 declaration under Se ction 564(b)(1) of the Act, 21 U.S.C. section 360bbb-3(b)(1), unless the authorization is  terminated or revoked sooner.  Performed at [facility], [place] [address] [place], [place] [address]     Radiology Studies: DG Abd 1 View  Result Date: 06/01/2020 CLINICAL DATA:  Abdominal pain.  Small-bowel obstruction. EXAM: ABDOMEN - 1 VIEW COMPARISON:  CT 06/01/2019. FINDINGS: Surgical clips right upper quadrant. Slightly dilated loops of small bowel again noted. Colon is nondistended. No free air. Pelvic calcification consistent with phlebolith. Lumbar spine scoliosis concave left. Degenerative changes lumbar spine and both hips. IMPRESSION: Slightly dilated loops of small bowel again noted. Colon is nondistended. No free air. Electronically Signed   By: Marcello Moores  Register   On: 06/01/2020 08:22   CT Abdomen Pelvis W Contrast  Result Date: 05/31/2020 CLINICAL DATA:  Abdominal pain and nausea for several days. History of non-Hodgkin's lymphoma. EXAM: CT ABDOMEN AND PELVIS WITH CONTRAST  TECHNIQUE: Multidetector CT imaging of the abdomen and pelvis was performed using the standard protocol following bolus administration of intravenous contrast. CONTRAST:  132mL OMNIPAQUE IOHEXOL 300 MG/ML  SOLN COMPARISON:  Ultrasound 09/03/2019.  CT 11/14/2018 FINDINGS: Lower chest: Dependent changes in the lung bases. Fluid in the nondistended esophagus may indicate reflux or dysmotility. Hepatobiliary: Multiple circumscribed low-attenuation lesions demonstrated throughout the liver, largest measuring 4.1 cm diameter. No change since prior study. Appearance is consistent with benign cysts. Mild diffuse fatty infiltration of the liver. Gallbladder is surgically absent. No bile duct dilatation. Pancreas: Unremarkable. No pancreatic ductal dilatation or surrounding inflammatory changes. Spleen: Normal in size without focal abnormality. Adrenals/Urinary Tract: Adrenal glands are unremarkable. Kidneys are normal, without renal calculi, focal lesion, or hydronephrosis. Bladder is decompressed, likely accounting for bladder wall thickening. Stomach/Bowel: Stomach is fluid-filled without wall thickening. Mildly distended fluid-filled small bowel with air-fluid levels. Terminal ileum is decompressed. Transition zone appears to be in the right mid abdomen. No wall thickening or infiltration. Appearances are consistent with low grade small bowel obstruction. Colon is mostly decompressed. No wall thickening or inflammatory changes. Fluid-filled colon indicates diarrhea. Appendix is not identified. Vascular/Lymphatic: Aortic atherosclerosis. No enlarged abdominal or pelvic lymph nodes. Reproductive: Status post hysterectomy. No adnexal masses. Other: Small amount of free fluid in the pelvis is likely reactive. No free air. Abdominal wall musculature appears intact. Musculoskeletal: Vertebral hemangiomas. IMPRESSION: 1. Low grade small bowel obstruction with transition zone in the right mid abdomen. 2. Fluid-filled colon  indicates diarrhea. 3. Small amount of free fluid in the pelvis is likely reactive. 4. Multiple benign-appearing hepatic cysts. 5. Fluid in the nondistended esophagus may indicate reflux or dysmotility. 6. Aortic atherosclerosis. Aortic Atherosclerosis (ICD10-I70.0). Electronically Signed   By: Lucienne Capers M.D.   On: 05/31/2020 20:18   Scheduled Meds: . conjugated estrogens  1 Applicatorful Vaginal Daily  . heparin  5,000 Units Subcutaneous Q8H   Continuous Infusions: . cefTRIAXone (ROCEPHIN)  IV Stopped (06/01/20 0329)  . lactated ringers 75 mL/hr at 06/01/20 0055     LOS: 1 day    Time spent: 35 mins    Rufino Staup, MD [facility]   If 7PM-7AM, please contact night-coverage

## 2020-06-02 ENCOUNTER — Inpatient Hospital Stay: Payer: Medicare Other

## 2020-06-02 DIAGNOSIS — K56600 Partial intestinal obstruction, unspecified as to cause: Principal | ICD-10-CM

## 2020-06-02 DIAGNOSIS — K567 Ileus, unspecified: Secondary | ICD-10-CM

## 2020-06-02 LAB — CBC
HCT: 37.8 % (ref 36.0–46.0)
Hemoglobin: 12.9 g/dL (ref 12.0–15.0)
MCH: 29.9 pg (ref 26.0–34.0)
MCHC: 34.1 g/dL (ref 30.0–36.0)
MCV: 87.5 fL (ref 80.0–100.0)
Platelets: 224 10*3/uL (ref 150–400)
RBC: 4.32 MIL/uL (ref 3.87–5.11)
RDW: 12.4 % (ref 11.5–15.5)
WBC: 5.8 10*3/uL (ref 4.0–10.5)
nRBC: 0 % (ref 0.0–0.2)

## 2020-06-02 LAB — BASIC METABOLIC PANEL
Anion gap: 11 (ref 5–15)
BUN: 12 mg/dL (ref 8–23)
CO2: 25 mmol/L (ref 22–32)
Calcium: 9.1 mg/dL (ref 8.9–10.3)
Chloride: 104 mmol/L (ref 98–111)
Creatinine, Ser: 0.78 mg/dL (ref 0.44–1.00)
GFR, Estimated: >60 mL/min (ref >60)
Glucose, Bld: 87 mg/dL (ref 70–99)
Potassium: 4.1 mmol/L (ref 3.5–5.1)
Sodium: 140 mmol/L (ref 135–145)

## 2020-06-02 LAB — MAGNESIUM: Magnesium: 2 mg/dL (ref 1.7–2.4)

## 2020-06-02 LAB — PHOSPHORUS: Phosphorus: 4.1 mg/dL (ref 2.5–4.6)

## 2020-06-02 NOTE — Progress Notes (Signed)
[facility] SURGICAL PROGRESS NOTE (cpt [phone])  Hospital Day(s): 2.   Interval History: Patient seen and examined, no acute events or new complaints overnight. Patient reports she is feeling better this morning. She had some upper abdominal soreness with moving around in bed, but otherwise denies abdominal pain, nausea, distension, emesis, or fever. Her labs are all grossly normal this morning. She had been advanced to full liquid diet yesterday without issues. She continues to endorse flatus and "feels like she will have a BM today." No other complaints.   Review of Systems:  Constitutional: denies fever, chills  HEENT: denies cough or congestion  Respiratory: denies any shortness of breath  Cardiovascular: denies chest pain or palpitations  Gastrointestinal: denies abdominal pain, N/V, or diarrhea/and bowel function as per interval history Genitourinary: denies burning with urination or urinary frequency Musculoskeletal: denies pain, decreased motor or sensation  Vital signs in last 24 hours: [min-max] current  Temp:  [97.4 F (36.3 C)-98.4 F (36.9 C)] 97.8 F (36.6 C) (01/26 0521) Pulse Rate:  [64-85] 82 (01/26 0521) Resp:  [16-20] 20 (01/26 0521) BP: (116-137)/(65-87) 124/74 (01/26 0521) SpO2:  [92 %-100 %] 97 % (01/26 0521) Weight:  [75.1 kg] 75.1 kg (01/25 1601)     Height: 5\' 2"  (157.5 cm) (per patient) Weight: 75.1 kg BMI (Calculated): 30.27   Intake/Output last 2 shifts:  01/25 0701 - 01/26 0700 In: 1344.5 [I.V.:1244.5; IV Piggyback:100] Out: -    Physical Exam:  Constitutional: alert, cooperative and no distress  HENT: normocephalic without obvious abnormality  Eyes: PERRL, EOM's grossly intact and symmetric  Respiratory: breathing non-labored at rest  Cardiovascular: regular rate and sinus rhythm  Gastrointestinal: Soft, non-tender, and non-distended, no rebound/guarding Musculoskeletal: no edema or wounds, motor and sensation grossly intact, NT     Labs:  CBC Latest Ref Rng & Units 06/02/2020 06/01/2020 05/31/2020  WBC 4.0 - 10.5 K/uL 5.8 8.9 11.5(H)  Hemoglobin 12.0 - 15.0 g/dL 12.9 13.2 15.5(H)  Hematocrit 36.0 - 46.0 % 37.8 38.2 44.1  Platelets 150 - 400 K/uL 224 252 327   CMP Latest Ref Rng & Units 06/02/2020 06/01/2020 05/31/2020  Glucose 70 - 99 mg/dL 87 135(H) 128(H)  BUN 8 - 23 mg/dL 12 15 18   Creatinine 0.44 - 1.00 mg/dL 0.78 0.83 0.87  Sodium 135 - 145 mmol/L 140 134(L) 134(L)  Potassium 3.5 - 5.1 mmol/L 4.1 4.1 4.0  Chloride 98 - 111 mmol/L 104 99 99  CO2 22 - 32 mmol/L 25 25 22   Calcium 8.9 - 10.3 mg/dL 9.1 9.0 10.2  Total Protein 6.5 - 8.1 g/dL - - 8.1  Total Bilirubin 0.3 - 1.2 mg/dL - - 1.0  Alkaline Phos 38 - 126 U/L - - 58  AST 15 - 41 U/L - - 27  ALT 0 - 44 U/L - - 29     Imaging studies:   CXR + KUB (06/02/2020) personally reviewed still with air/fluid filled small bowel loops, some air in colon, and radiologist report reviewed: IMPRESSION: 1. Persistent small-bowel distention, unchanged from prior exam. No colonic distention. No free air. 2. Mild bibasilar atelectasis.   Assessment/Plan: (ICD-10's: K83.609) 80 y.o. female with clinically improved likely partial SBO.   - she can advance diet as tolerated as she continues to show clinical improvement.   - No need for surgical intervention             - Discontinue IVF             -  Monitor abdominal examination; on-going bowel function             - Pain control prn (minimize narcotics); antiemetics prn             - Mobilization as tolerated             - Further management per primary service               - Discharge Planning:  If she tolerates diet advancement today, she is okay for discharge from surgical standpoint. She can follow up with PCP for this. She does not need surgical follow up. We will be available if needed  All of the above findings and recommendations were discussed with the patient, and the medical team, and all of patient's  questions were answered to her  expressed satisfaction.  -- Edison Simon, PA-C [facility] 06/02/2020, 7:22 AM [phone] M-F: 7am - 4pm

## 2020-06-02 NOTE — Discharge Summary (Signed)
[facility] NAME: [name]    MR#:  [id number]  DATE OF BIRTH:  [birth year]  DATE OF ADMISSION:  05/31/2020   ADMITTING PHYSICIAN: Artist Beach, MD  DATE OF DISCHARGE: 06/02/2020  7:07 PM  PRIMARY CARE PHYSICIAN: Marinda Elk, MD   ADMISSION DIAGNOSIS:  Small bowel obstruction (HCC) [K56.609] SBO (small bowel obstruction) ([place]) [K56.609] Partial intestinal obstruction, unspecified cause ([place]) [K56.600] DISCHARGE DIAGNOSIS:  Active Problems:   Small bowel obstruction (HCC)   Partial intestinal obstruction ([place])  SECONDARY DIAGNOSIS:   Past Medical History:  Diagnosis Date  . Allergic rhinitis   . Allergy    seasonal  . Arthritis    degenerative of hip right  . Barrett esophagus   . Barrett esophagus   . Benign essential tremor   . Chronic bronchitis ([place])   . GERD (gastroesophageal reflux disease)   . History of colonic polyps   . 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 Hyperlipidemia   . Hypertension   . Melanoma ([place])   . Migraine   . 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 Non Hodgkin's lymphoma ([place])   . Non Hodgkin's lymphoma ([place])   . RAD (reactive airway disease)   . 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    Acute abdominal pain secondary to partial small bowel obstruction: Patient presented with abdominal pain associated with distention and bloating. CT abdomen shows evidence of partial small bowel obstruction. Improved with conservative mgmt. Tolerated diet   S/p recent EGD and colonoscopy: Patient follows up with Dr. Haig Prophet. Outpatient GI  follow-up  Hypertension: stable  UTI : treated while in the hospital. no urine cultures obtained. She is asymptomatic, afebrile and has no leukocytosis. No further treatment needed.   DISCHARGE CONDITIONS:  stable CONSULTS OBTAINED:  Treatment Team:  Jules Husbands, MD DRUG ALLERGIES:   Allergies  Allergen Reactions  . Buprenorphine Hcl Nausea And Vomiting  . Morphine And Related Nausea And Vomiting   DISCHARGE MEDICATIONS:   Allergies as of 06/02/2020      Reactions   Buprenorphine Hcl Nausea And Vomiting   Morphine And Related Nausea And Vomiting      Medication List    STOP taking these medications   conjugated estrogens vaginal cream Commonly known as: PREMARIN     TAKE these medications   CALCIUM 1200 PO Take 1 tablet by mouth 1 day or 1 dose.   lisinopril 10 MG tablet Commonly known as: ZESTRIL Take 10 mg by mouth daily.   loperamide 2 MG tablet Commonly known as: IMODIUM A-D Take 2 mg by mouth 4 (four) times daily as needed for diarrhea or loose stools.   loratadine 10 MG tablet Commonly known as: CLARITIN Take 10 mg by mouth every morning.   omeprazole 20 MG capsule Commonly known as: PRILOSEC Take 20 mg by mouth daily.   ondansetron 4 MG disintegrating tablet Commonly known as: Zofran ODT Take 1 tablet (4 mg total) by mouth every 8 (eight) hours as needed for nausea or vomiting.   Vitamin D3 Gummies 25 MCG (1000 UT) Chew Generic drug: Cholecalciferol Chew 1,000  Units by mouth daily.      DISCHARGE INSTRUCTIONS:   DIET:  Soft diet, advance as tolerated DISCHARGE CONDITION:  Stable ACTIVITY:  Activity as tolerated OXYGEN:  Home Oxygen: No.  Oxygen Delivery: room air DISCHARGE LOCATION:  home   If you experience worsening of your admission symptoms, develop shortness of breath, life threatening emergency, suicidal or homicidal thoughts you must seek medical attention immediately by calling 911 or calling your MD immediately  if  symptoms less severe.  You Must read complete instructions/literature along with all the possible adverse reactions/side effects for all the Medicines you take and that have been prescribed to you. Take any new Medicines after you have completely understood and accpet all the possible adverse reactions/side effects.   Please note  You were cared for by a hospitalist during your hospital stay. If you have any questions about your discharge medications or the care you received while you were in the hospital after you are discharged, you can call the unit and asked to speak with the hospitalist on call if the hospitalist that took care of you is not available. Once you are discharged, your primary care physician will handle any further medical issues. Please note that NO REFILLS for any discharge medications will be authorized once you are discharged, as it is imperative that you return to your primary care physician (or establish a relationship with a primary care physician if you do not have one) for your aftercare needs so that they can reassess your need for medications and monitor your lab values.    On the day of Discharge:  VITAL SIGNS:  Blood pressure (!) 141/78, pulse 77, temperature (!) 97.4 F (36.3 C), temperature source Oral, resp. rate 17, height 5\' 2"  (1.575 m), weight 75.1 kg, SpO2 96 %. PHYSICAL EXAMINATION:  GENERAL:  80 y.o.-year-old patient lying in the bed with no acute distress.  EYES: Pupils equal, round, reactive to light and accommodation. No scleral icterus. Extraocular muscles intact.  HEENT: Head atraumatic, normocephalic. Oropharynx and nasopharynx clear.  NECK:  Supple, no jugular venous distention. No thyroid enlargement, no tenderness.  LUNGS: Normal breath sounds bilaterally, no wheezing, rales,rhonchi or crepitation. No use of accessory muscles of respiration.  CARDIOVASCULAR: S1, S2 normal. No murmurs, rubs, or gallops.  ABDOMEN: Soft, non-tender, non-distended.  Bowel sounds present. No organomegaly or mass.  EXTREMITIES: No pedal edema, cyanosis, or clubbing.  NEUROLOGIC: Cranial nerves II through XII are intact. Muscle strength 5/5 in all extremities. Sensation intact. Gait not checked.  PSYCHIATRIC: The patient is alert and oriented x 3.  SKIN: No obvious rash, lesion, or ulcer.  DATA REVIEW:   CBC Recent Labs  Lab 06/02/20 0404  WBC 5.8  HGB 12.9  HCT 37.8  PLT 224    Chemistries  Recent Labs  Lab 05/31/20 1651 06/01/20 0420 06/02/20 0404  NA 134*   < > 140  K 4.0   < > 4.1  CL 99   < > 104  CO2 22   < > 25  GLUCOSE 128*   < > 87  BUN 18   < > 12  CREATININE 0.87   < > 0.78  CALCIUM 10.2   < > 9.1  MG  --   --  2.0  AST 27  --   --   ALT 29  --   --   ALKPHOS 58  --   --   BILITOT 1.0  --   --    < > =  values in this interval not displayed.     Outpatient follow-up  Follow-up Information    [facility] EMERGENCY DEPARTMENT.   Specialty: Emergency Medicine Why: If symptoms worsen Contact information: [place] [id number] [address] [place]       Marinda Elk, MD. Go on 06/07/2020.   Specialty: Physician Assistant Why: Go at 10:45am. Contact information: [place] [address] [phone]        Tylene Fantasia, PA-C. Go on 06/17/2020.   Specialty: Physician Assistant Why: Go at 10:00am. Contact information: [address] [place] [address] [phone]               21 Day Unplanned Readmission Risk Score   Flowsheet Row ED to Hosp-Admission (Discharged) from 05/31/2020 in [facility]  30 Day Unplanned Readmission Risk Score (%) 10.05 Filed at 06/02/2020 1600     This score is the patient's risk of an unplanned readmission within 30 days of being discharged (0 -100%). The score is based on dignosis, age, lab data, medications, orders, and past  utilization.   Low:  0-14.9   Medium: 15-21.9   High: 22-29.9   Extreme: 30 and above         Management plans discussed with the patient, family and they are in agreement.  CODE STATUS: Full Code   TOTAL TIME TAKING CARE OF THIS PATIENT: 45 minutes.    Max Sane M.D on 06/02/2020 at 7:20 PM  [facility]   CC: Primary care physician; Marinda Elk, MD   Note: This dictation was prepared with Dragon dictation along with smaller phrase technology. Any transcriptional errors that result from this process are unintentional.

## 2020-06-17 ENCOUNTER — Encounter: Payer: Self-pay | Admitting: Physician Assistant

## 2020-06-17 ENCOUNTER — Other Ambulatory Visit: Payer: Self-pay

## 2020-06-17 ENCOUNTER — Ambulatory Visit (INDEPENDENT_AMBULATORY_CARE_PROVIDER_SITE_OTHER): Payer: Medicare Other | Admitting: Physician Assistant

## 2020-06-17 VITALS — BP 130/84 | HR 81 | Temp 97.8°F | Ht 62.0 in | Wt 173.4 lb

## 2020-06-17 DIAGNOSIS — K5651 Intestinal adhesions [bands], with partial obstruction: Secondary | ICD-10-CM

## 2020-06-17 NOTE — Progress Notes (Signed)
[facility] SURGICAL CLINIC NOTE  06/17/2020  History of Present Illness: [name] is a 80 y.o. female known to me following recent admission (01/24 - 01/26) for a small bowel obstruction, which was likely partial in nature. She ultimately did well with conservative treatment. Despite not needing surgical follow up for this, she was referred to our clinic. Today, she reports that since discharge she has done very well. She needed magnesium citrate for constipation one day after going home but since then has had no issues. No fever, chills, cough, SOB, CP, abdominal pain, nausea, emesis, or bowel changes. She again endorses this is her second bowel obstruction, last coming in 2013. She has not other complaints or concerns today. She is scheduled for repeat colonoscopy in a few weeks.   Past Medical History: Past Medical History:  Diagnosis Date  . Allergic rhinitis   . Allergy    seasonal  . Arthritis    degenerative of hip right  . Barrett esophagus   . Barrett esophagus   . Benign essential tremor   . Chronic bronchitis ([place])   . GERD (gastroesophageal reflux disease)   . History of colonic polyps   . 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 Hyperlipidemia   . Hypertension   . Melanoma ([place])   . Migraine   . 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 Non Hodgkin's lymphoma ([place])   . Non Hodgkin's lymphoma ([place])   . RAD (reactive airway disease)   . Skin cancer      Past Surgical History: Past Surgical History:  Procedure Laterality Date  . ABDOMINAL HYSTERECTOMY    . APPENDECTOMY    . BASAL CELL CARCINOMA EXCISION     on both sides of nose  . BREAST BIOPSY Left 12/2013   hyperplasia  . CHOLECYSTECTOMY    . COLONOSCOPY WITH PROPOFOL N/A 06/05/2017   Procedure: COLONOSCOPY WITH PROPOFOL;  Surgeon: Lollie Sails, MD;  Location: [facility] ENDOSCOPY;  Service: Endoscopy;  Laterality: N/A;  . COLONOSCOPY WITH  PROPOFOL N/A 05/14/2020   Procedure: COLONOSCOPY WITH PROPOFOL;  Surgeon: Lesly Rubenstein, MD;  Location: [facility] ENDOSCOPY;  Service: Endoscopy;  Laterality: N/A;  . ESOPHAGOGASTRODUODENOSCOPY (EGD) WITH PROPOFOL N/A 03/30/2015   Procedure: ESOPHAGOGASTRODUODENOSCOPY (EGD) WITH PROPOFOL;  Surgeon: Lollie Sails, MD;  Location: [facility] ENDOSCOPY;  Service: Endoscopy;  Laterality: N/A;  . ESOPHAGOGASTRODUODENOSCOPY (EGD) WITH PROPOFOL N/A 06/05/2017   Procedure: ESOPHAGOGASTRODUODENOSCOPY (EGD) WITH PROPOFOL;  Surgeon: Lollie Sails, MD;  Location: [facility] ENDOSCOPY;  Service: Endoscopy;  Laterality: N/A;  . ESOPHAGOGASTRODUODENOSCOPY (EGD) WITH PROPOFOL N/A 05/14/2020   Procedure: ESOPHAGOGASTRODUODENOSCOPY (EGD) WITH PROPOFOL;  Surgeon: Lesly Rubenstein, MD;  Location: [facility] ENDOSCOPY;  Service: Endoscopy;  Laterality: N/A;  . FACIAL COSMETIC SURGERY    . history of multiple colonic polyps    . lymph node removal from left groin    . OOPHORECTOMY    . VENTRAL HERNIA REPAIR N/A 10/02/2014   Procedure: HERNIA REPAIR VENTRAL ADULT;  Surgeon: Leonie Green, MD;  Location: [facility] ORS;  Service: General;  Laterality: N/A;  with mesh    Home Medications: Prior to Admission medications   Medication Sig Start Date End Date Taking? Authorizing Provider  Calcium Carbonate-Vit D-Min (CALCIUM 1200 PO) Take 1 tablet by mouth 1 day or 1 dose.   Yes [provider]  Cholecalciferol (VITAMIN D3 GUMMIES) 25 MCG (1000 UT) CHEW Chew 1,000 Units by mouth  daily.   Yes [provider]  lisinopril (PRINIVIL,ZESTRIL) 10 MG tablet Take 10 mg by mouth daily. 09/21/16  Yes [provider]  omeprazole (PRILOSEC) 20 MG capsule Take 20 mg by mouth daily. 06/08/15  Yes [provider]    Allergies: Allergies  Allergen Reactions  . Buprenorphine Hcl Nausea And Vomiting  . Morphine And Related Nausea And Vomiting    Review of Systems: Review of Systems  Constitutional: Negative  for chills and fever.  HENT: Negative for congestion and sore throat.   Respiratory: Negative for cough and shortness of breath.   Cardiovascular: Negative for chest pain and palpitations.  Gastrointestinal: Negative for abdominal pain, blood in stool, diarrhea, nausea and vomiting.  Genitourinary: Negative for dysuria and urgency.  All other systems reviewed and are negative.   Physical Exam BP 130/84   Pulse 81   Temp 97.8 F (36.6 C) (Oral)   Ht 5\' 2"  (1.575 m)   Wt 173 lb 6.4 oz (78.7 kg)   SpO2 95%   BMI 31.72 kg/m   Physical Exam Vitals and nursing note reviewed. Exam conducted with a chaperone present.  Constitutional:      General: She is not in acute distress.    Appearance: Normal appearance. She is obese. She is not ill-appearing.  HENT:     Head: Normocephalic and atraumatic.     Mouth/Throat:     Mouth: Mucous membranes are dry.  Eyes:     Conjunctiva/sclera: Conjunctivae normal.     Pupils: Pupils are equal, round, and reactive to light.  Cardiovascular:     Rate and Rhythm: Normal rate and regular rhythm.     Heart sounds: No murmur heard.   Pulmonary:     Effort: Pulmonary effort is normal. No respiratory distress.     Breath sounds: Normal breath sounds.  Abdominal:     General: Abdomen is flat. There is no distension.     Tenderness: There is no abdominal tenderness. There is no guarding or rebound.  Genitourinary:    Comments: Deferred Musculoskeletal:     Right lower leg: No edema.     Left lower leg: No edema.  Skin:    General: Skin is warm and dry.     Coloration: Skin is not pale.     Findings: No erythema.  Neurological:     General: No focal deficit present.     Mental Status: She is alert and oriented to person, place, and time.  Psychiatric:        Mood and Affect: Mood normal.        Behavior: Behavior normal.     Labs/Imaging: No new pertinent imaging studies available   Assessment and Plan: This is a 80 y.o. female with  recent admission for likely partial SBO which resolved without interventions   - Nothing further to add from a surgical perspective  - I did discuss role of elective adhesiolysis; however, we typical reserve this for patient with numerous bowel obstructions in a short period of time, which she does not fall into that criteria.   - She understands the signs / symptoms of bowel obstructions  - I will be happy to see as needed  Face-to-face time spent with the patient and care providers was 20 minutes, with more than 50% of the time spent counseling, educating, and coordinating care of the patient.     Edison Simon, PA-C [facility] 06/17/2020, 10:02 AM [phone] M-F: 7am - 4pm

## 2020-08-19 ENCOUNTER — Ambulatory Visit: Payer: Medicare Other | Admitting: Internal Medicine

## 2020-08-19 ENCOUNTER — Other Ambulatory Visit: Payer: Medicare Other

## 2020-08-26 ENCOUNTER — Inpatient Hospital Stay (HOSPITAL_BASED_OUTPATIENT_CLINIC_OR_DEPARTMENT_OTHER): Payer: Medicare Other | Admitting: Internal Medicine

## 2020-08-26 ENCOUNTER — Encounter: Payer: Self-pay | Admitting: Internal Medicine

## 2020-08-26 ENCOUNTER — Inpatient Hospital Stay: Payer: Medicare Other | Attending: Internal Medicine

## 2020-08-26 DIAGNOSIS — Z833 Family history of diabetes mellitus: Secondary | ICD-10-CM | POA: Diagnosis not present

## 2020-08-26 DIAGNOSIS — C8203 Follicular lymphoma grade I, intra-abdominal lymph nodes: Secondary | ICD-10-CM

## 2020-08-26 DIAGNOSIS — Z808 Family history of malignant neoplasm of other organs or systems: Secondary | ICD-10-CM | POA: Insufficient documentation

## 2020-08-26 DIAGNOSIS — M549 Dorsalgia, unspecified: Secondary | ICD-10-CM | POA: Diagnosis not present

## 2020-08-26 DIAGNOSIS — Z8249 Family history of ischemic heart disease and other diseases of the circulatory system: Secondary | ICD-10-CM | POA: Diagnosis not present

## 2020-08-26 DIAGNOSIS — Z823 Family history of stroke: Secondary | ICD-10-CM | POA: Insufficient documentation

## 2020-08-26 DIAGNOSIS — Z79899 Other long term (current) drug therapy: Secondary | ICD-10-CM | POA: Insufficient documentation

## 2020-08-26 DIAGNOSIS — Z8719 Personal history of other diseases of the digestive system: Secondary | ICD-10-CM | POA: Insufficient documentation

## 2020-08-26 DIAGNOSIS — Z90721 Acquired absence of ovaries, unilateral: Secondary | ICD-10-CM | POA: Diagnosis not present

## 2020-08-26 DIAGNOSIS — M2550 Pain in unspecified joint: Secondary | ICD-10-CM | POA: Insufficient documentation

## 2020-08-26 DIAGNOSIS — Z9049 Acquired absence of other specified parts of digestive tract: Secondary | ICD-10-CM | POA: Insufficient documentation

## 2020-08-26 DIAGNOSIS — Z811 Family history of alcohol abuse and dependence: Secondary | ICD-10-CM | POA: Diagnosis not present

## 2020-08-26 DIAGNOSIS — Z885 Allergy status to narcotic agent status: Secondary | ICD-10-CM | POA: Diagnosis not present

## 2020-08-26 DIAGNOSIS — Z85828 Personal history of other malignant neoplasm of skin: Secondary | ICD-10-CM | POA: Diagnosis not present

## 2020-08-26 DIAGNOSIS — Z8051 Family history of malignant neoplasm of kidney: Secondary | ICD-10-CM | POA: Insufficient documentation

## 2020-08-26 LAB — CBC WITH DIFFERENTIAL/PLATELET
Abs Immature Granulocytes: 0.05 10*3/uL (ref 0.00–0.07)
Basophils Absolute: 0 10*3/uL (ref 0.0–0.1)
Basophils Relative: 1 %
Eosinophils Absolute: 0.1 10*3/uL (ref 0.0–0.5)
Eosinophils Relative: 1 %
HCT: 39.1 % (ref 36.0–46.0)
Hemoglobin: 13.9 g/dL (ref 12.0–15.0)
Immature Granulocytes: 1 %
Lymphocytes Relative: 47 %
Lymphs Abs: 4 10*3/uL (ref 0.7–4.0)
MCH: 30.5 pg (ref 26.0–34.0)
MCHC: 35.5 g/dL (ref 30.0–36.0)
MCV: 85.7 fL (ref 80.0–100.0)
Monocytes Absolute: 0.7 10*3/uL (ref 0.1–1.0)
Monocytes Relative: 8 %
Neutro Abs: 3.5 10*3/uL (ref 1.7–7.7)
Neutrophils Relative %: 42 %
Platelets: 295 10*3/uL (ref 150–400)
RBC: 4.56 MIL/uL (ref 3.87–5.11)
RDW: 13 % (ref 11.5–15.5)
WBC: 8.3 10*3/uL (ref 4.0–10.5)
nRBC: 0 % (ref 0.0–0.2)

## 2020-08-26 LAB — COMPREHENSIVE METABOLIC PANEL
ALT: 27 U/L (ref 0–44)
AST: 25 U/L (ref 15–41)
Albumin: 4 g/dL (ref 3.5–5.0)
Alkaline Phosphatase: 60 U/L (ref 38–126)
Anion gap: 10 (ref 5–15)
BUN: 21 mg/dL (ref 8–23)
CO2: 25 mmol/L (ref 22–32)
Calcium: 9.5 mg/dL (ref 8.9–10.3)
Chloride: 104 mmol/L (ref 98–111)
Creatinine, Ser: 0.83 mg/dL (ref 0.44–1.00)
GFR, Estimated: >60 mL/min (ref >60)
Glucose, Bld: 104 mg/dL — ABNORMAL HIGH (ref 70–99)
Potassium: 4 mmol/L (ref 3.5–5.1)
Sodium: 139 mmol/L (ref 135–145)
Total Bilirubin: 0.4 mg/dL (ref 0.3–1.2)
Total Protein: 7.1 g/dL (ref 6.5–8.1)

## 2020-08-26 LAB — LACTATE DEHYDROGENASE: LDH: 114 U/L (ref 98–192)

## 2020-08-26 NOTE — Progress Notes (Signed)
[place] OFFICE PROGRESS NOTE  Patient Care Team: Marinda Elk, MD as PCP - General (Physician Assistant) Cammie Sickle, MD as Consulting Physician (Hematology and Oncology)  Cancer Staging No matching staging information was found for the patient.    Oncology History Overview Note    1.FEB 2013-  MESENTERIC LYMPHADENOPATHY;] Pet positive lymphadenopathy.; . 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 3. Rituxan weekly x4 finished in March of 2013 4. PET scan in July of 2013 complete remission. 5. Maintenance Rituxan started in July of 2013;  x 2 years [finished 2015]  #April 2019-melanoma/left side of the nose [2020]/ Left arm [2021][Dr. Kowalski/Dr.Dasher]   # LFTs- slightly up/ ? faty liver.  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  In the interim patient denies any new lymph nodes.  Denies any night sweats.  Denies any fevers or chills.  Patient notes to have small growth around the vulvar area.  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 Leafy Ro next month.   REVIEW OF SYSTEMS:  Review of Systems  Constitutional: Negative.  Negative for chills, fever, malaise/fatigue and weight loss.  HENT: Negative for congestion, ear pain and tinnitus.   Eyes: Negative.  Negative for blurred vision and double vision.  Respiratory: Negative.  Negative for cough, sputum production and shortness of breath.   Cardiovascular: Negative.  Negative for chest pain, palpitations and leg swelling.   Gastrointestinal: Negative.  Negative for abdominal pain, constipation, diarrhea, nausea and vomiting.  Musculoskeletal: Positive for back pain and joint pain. Negative for falls.  Neurological: Negative.  Negative for weakness and headaches.  Endo/Heme/Allergies: Negative.  Does not bruise/bleed easily.  Psychiatric/Behavioral: Negative.  Negative for depression. The patient is not nervous/anxious and does not have insomnia.    PAST MEDICAL HISTORY :  Past Medical History:  Diagnosis Date  . Allergic rhinitis   . Allergy    seasonal  . Arthritis    degenerative of hip right  . Barrett esophagus   . Barrett esophagus   . Benign essential tremor   . Chronic bronchitis ([place])   . GERD (gastroesophageal reflux disease)   . History of colonic polyps   . 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 Hyperlipidemia   . Hypertension   . Melanoma ([place])   . Migraine   . 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 ([place]) 09/13/2014  . Non Hodgkin's lymphoma ([place])   . Non Hodgkin's lymphoma ([place])   . RAD (reactive airway disease)   . Skin cancer     PAST SURGICAL HISTORY :   Past Surgical History:  Procedure Laterality Date  . ABDOMINAL HYSTERECTOMY    . APPENDECTOMY    . BASAL CELL CARCINOMA EXCISION     on both sides of nose  . BREAST BIOPSY Left 12/2013   hyperplasia  . CHOLECYSTECTOMY    . COLONOSCOPY WITH PROPOFOL N/A 06/05/2017   Procedure: COLONOSCOPY WITH PROPOFOL;  Surgeon: Lollie Sails, MD;  Location[facility] ENDOSCOPY;  Service: Endoscopy;  Laterality: N/A;  . COLONOSCOPY WITH  PROPOFOL N/A 05/14/2020   Procedure: COLONOSCOPY WITH PROPOFOL;  Surgeon: Lesly Rubenstein, MD;  Location[facility] ENDOSCOPY;  Service: Endoscopy;  Laterality: N/A;  . ESOPHAGOGASTRODUODENOSCOPY (EGD) WITH PROPOFOL N/A 03/30/2015   Procedure: ESOPHAGOGASTRODUODENOSCOPY (EGD) WITH PROPOFOL;  Surgeon: Lollie Sails, MD;  Location: [facility] ENDOSCOPY;  Service: Endoscopy;   Laterality: N/A;  . ESOPHAGOGASTRODUODENOSCOPY (EGD) WITH PROPOFOL N/A 06/05/2017   Procedure: ESOPHAGOGASTRODUODENOSCOPY (EGD) WITH PROPOFOL;  Surgeon: Lollie Sails, MD;  Location: [facility] ENDOSCOPY;  Service: Endoscopy;  Laterality: N/A;  . ESOPHAGOGASTRODUODENOSCOPY (EGD) WITH PROPOFOL N/A 05/14/2020   Procedure: ESOPHAGOGASTRODUODENOSCOPY (EGD) WITH PROPOFOL;  Surgeon: Lesly Rubenstein, MD;  Location: [facility] ENDOSCOPY;  Service: Endoscopy;  Laterality: N/A;  . FACIAL COSMETIC SURGERY    . history of multiple colonic polyps    . lymph node removal from left groin    . OOPHORECTOMY    . VENTRAL HERNIA REPAIR N/A 10/02/2014   Procedure: HERNIA REPAIR VENTRAL ADULT;  Surgeon: Leonie Green, MD;  Location: [facility] ORS;  Service: General;  Laterality: N/A;  with mesh    FAMILY HISTORY :   Family History  Problem Relation Age of Onset  . Alcohol abuse Father   . Stroke Father   . Diabetes Sister   . Kidney cancer Sister   . Melanoma Brother   . Diabetes Brother   . Heart attack Brother   . Breast cancer Neg Hx   . Bladder Cancer Neg Hx   . Prostate cancer Neg Hx     SOCIAL HISTORY:   Social History   Tobacco Use  . Smoking status: Never Smoker  . Smokeless tobacco: Never Used  Vaping Use  . Vaping Use: Never used  Substance Use Topics  . Alcohol use: Not Currently    Alcohol/week: 1.0 standard drink    Types: 1 Cans of beer per week    Comment: rare social occasions only  . Drug use: Never    ALLERGIES:  is allergic to buprenorphine hcl and morphine and related.  MEDICATIONS:  Current Outpatient Medications  Medication Sig Dispense Refill  . Calcium Carbonate-Vit D-Min (CALCIUM 1200 PO) Take 1 tablet by mouth 1 day or 1 dose.    . Cholecalciferol (VITAMIN D3 GUMMIES) 25 MCG (1000 UT) CHEW Chew 1,000 Units by mouth daily.    Marland Kitchen lisinopril (PRINIVIL,ZESTRIL) 10 MG tablet Take 10 mg by mouth daily.    Marland Kitchen omeprazole (PRILOSEC) 20 MG capsule Take 20 mg by mouth daily.     . rosuvastatin (CRESTOR) 5 MG tablet Take by mouth.    . loratadine (CLARITIN) 10 MG tablet Take by mouth.     No current facility-administered medications for this visit.    PHYSICAL EXAMINATION: ECOG PERFORMANCE STATUS: 0 - Asymptomatic  BP (!) 134/94 (BP Location: Left Arm, Patient Position: Sitting, Cuff Size: Normal)   Pulse 84   Temp 97.9 F (36.6 C) (Tympanic)   Resp 16   Ht 5\' 2"  (1.575 m)   Wt 175 lb 8 oz (79.6 kg)   SpO2 96%   BMI 32.10 kg/m   Filed Weights   08/26/20 1021  Weight: 175 lb 8 oz (79.6 kg)   Physical Exam Constitutional:      Comments: Obese.  Walk independently.  HENT:     Head: Normocephalic and atraumatic.     Mouth/Throat:     Pharynx: No oropharyngeal exudate.  Eyes:     Pupils: Pupils are equal, round, and reactive to light.  Cardiovascular:  Rate and Rhythm: Normal rate and regular rhythm.  Pulmonary:     Effort: Pulmonary effort is normal. No respiratory distress.     Breath sounds: Normal breath sounds. No wheezing.  Abdominal:     General: Bowel sounds are normal. There is no distension.     Palpations: Abdomen is soft. There is no mass.     Tenderness: There is no abdominal tenderness. There is no guarding or rebound.  Musculoskeletal:        General: No tenderness. Normal range of motion.     Cervical back: Normal range of motion and neck supple.  Skin:    General: Skin is warm.  Neurological:     Mental Status: She is alert and oriented to person, place, and time.  Psychiatric:        Mood and Affect: Affect normal.    LABORATORY DATA:  I have reviewed the data as listed    Component Value Date/Time   NA 139 08/26/2020 0959   NA 141 03/03/2014 1022   K 4.0 08/26/2020 0959   K 4.1 03/03/2014 1022   CL 104 08/26/2020 0959   CL 105 03/03/2014 1022   CO2 25 08/26/2020 0959   CO2 28 03/03/2014 1022   GLUCOSE 104 (H) 08/26/2020 0959   GLUCOSE 112 (H) 03/03/2014 1022   BUN 21 08/26/2020 0959   BUN 18 03/03/2014  1022   CREATININE 0.83 08/26/2020 0959   CREATININE 0.94 03/03/2014 1022   CALCIUM 9.5 08/26/2020 0959   CALCIUM 9.4 03/03/2014 1022   PROT 7.1 08/26/2020 0959   PROT 7.4 03/03/2014 1022   ALBUMIN 4.0 08/26/2020 0959   ALBUMIN 4.1 03/03/2014 1022   AST 25 08/26/2020 0959   AST 29 03/03/2014 1022   ALT 27 08/26/2020 0959   ALT 53 03/03/2014 1022   ALKPHOS 60 08/26/2020 0959   ALKPHOS 81 03/03/2014 1022   BILITOT 0.4 08/26/2020 0959   BILITOT 0.4 03/03/2014 1022   GFRNONAA >60 08/26/2020 0959   GFRNONAA >60 03/03/2014 1022   GFRNONAA >60 09/02/2013 0939   GFRAA >60 08/21/2019 1031   GFRAA >60 03/03/2014 1022   GFRAA >60 09/02/2013 0939    No results found for: SPEP, UPEP  Lab Results  Component Value Date   WBC 8.3 08/26/2020   NEUTROABS 3.5 08/26/2020   HGB 13.9 08/26/2020   HCT 39.1 08/26/2020   MCV 85.7 08/26/2020   PLT 295 08/26/2020      Chemistry      Component Value Date/Time   NA 139 08/26/2020 0959   NA 141 03/03/2014 1022   K 4.0 08/26/2020 0959   K 4.1 03/03/2014 1022   CL 104 08/26/2020 0959   CL 105 03/03/2014 1022   CO2 25 08/26/2020 0959   CO2 28 03/03/2014 1022   BUN 21 08/26/2020 0959   BUN 18 03/03/2014 1022   CREATININE 0.83 08/26/2020 0959   CREATININE 0.94 03/03/2014 1022      Component Value Date/Time   CALCIUM 9.5 08/26/2020 0959   CALCIUM 9.4 03/03/2014 1022   ALKPHOS 60 08/26/2020 0959   ALKPHOS 81 03/03/2014 1022   AST 25 08/26/2020 0959   AST 29 03/03/2014 1022   ALT 27 08/26/2020 0959   ALT 53 03/03/2014 1022   BILITOT 0.4 08/26/2020 0959   BILITOT 0.4 03/03/2014 1022       RADIOGRAPHIC STUDIES: I have personally reviewed the radiological images as listed and agreed with the findings in the report. No results found.  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  CT scan July 2020-negative for any lymphadenopathy; STABLE.  # Hx of melanoma x2 [facial 2019] s/p surgery; Dr.Dasher-recommend close surveillance with dermatology.  #History of vaginal sclerosis/vaginal atrophy-continue steroid ointment/estrogen topical as needed.  Await evaluation with GYN next week  # Disposition:  # follow-up 6 m-MD /lab-CBC CMP [facility]- Dr.B   No orders of the defined types were placed in this encounter.    Cammie Sickle, MD 08/26/2020 11:19 AM

## 2020-08-26 NOTE — Progress Notes (Signed)
Patient has two places in vaginal area that she is very concerned about. She is worried it is cancer. She wanted to talk to you.

## 2020-08-26 NOTE — Assessment & Plan Note (Addendum)
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  CT scan July 2020-negative for any lymphadenopathy; STABLE.  # Hx of melanoma x2 [facial 2019] s/p surgery; Dr.Dasher-recommend close surveillance with dermatology.  #History of vaginal sclerosis/vaginal atrophy-continue steroid ointment/estrogen topical as needed.  Await evaluation with GYN next week  # Disposition:  # follow-up 6 m-MD /lab-CBC CMP [facility]- Dr.B

## 2020-10-15 ENCOUNTER — Ambulatory Visit: Admit: 2020-10-15 | Payer: Medicare Other

## 2020-10-15 SURGERY — COLONOSCOPY WITH PROPOFOL
Anesthesia: General

## 2021-02-11 IMAGING — MG MM DIGITAL SCREENING BILAT W/ TOMO W/ CAD
8 series · 8 of 24 positions shown · non-contrast
Comparison: Previous exam(s).

CLINICAL DATA: Screening.

EXAM:
DIGITAL SCREENING BILATERAL MAMMOGRAM WITH TOMO AND CAD

[L MLO synth-2D]
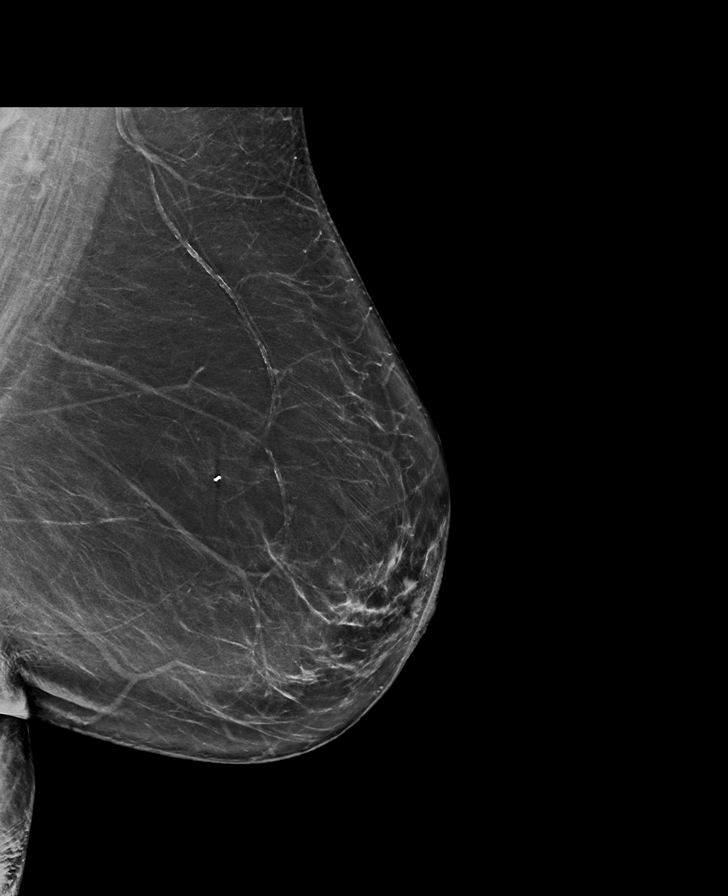

[R MLO synth-2D]
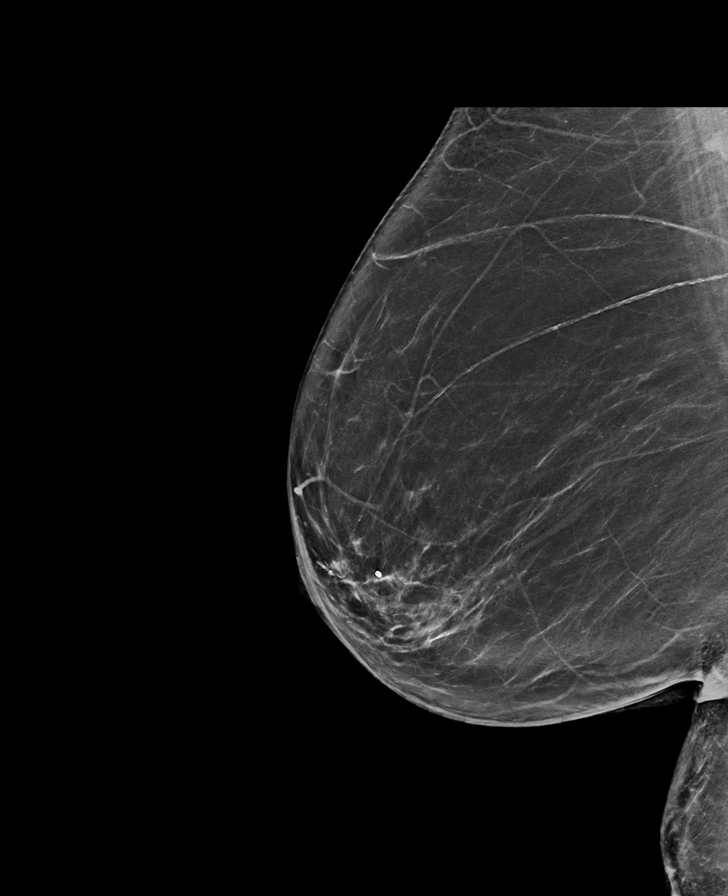

[L CC synth-2D]
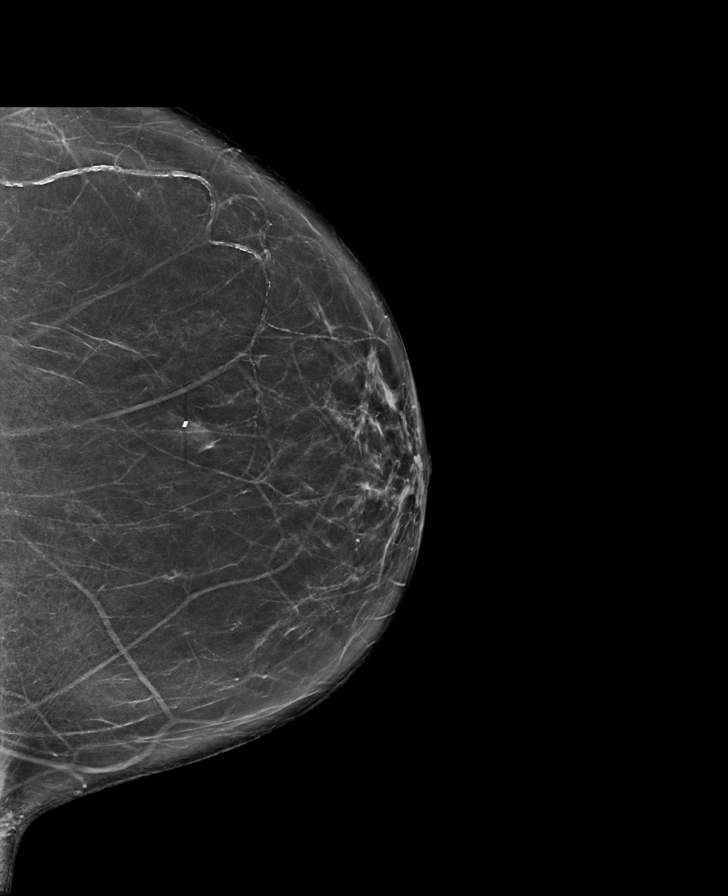

[R CC synth-2D]
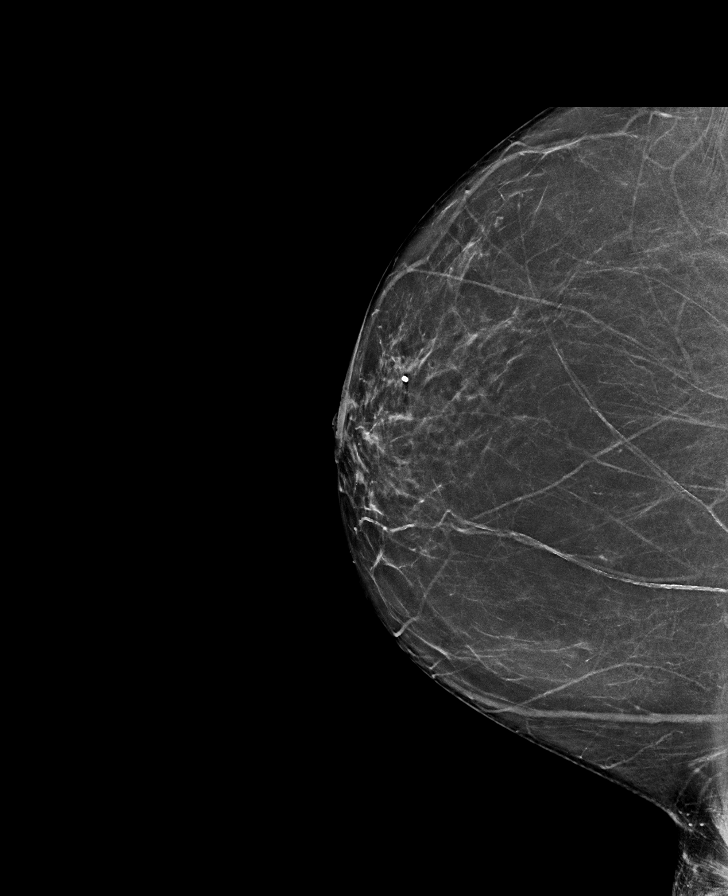

[L CC tomo · tomo slice 39/76.0]
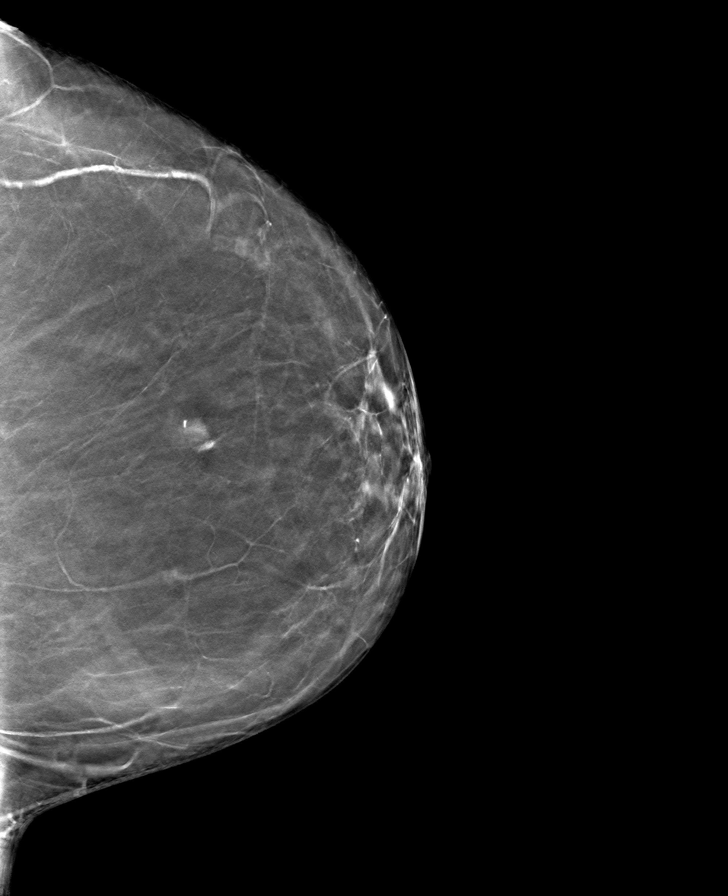

[L MLO tomo · tomo slice 40/79.0]
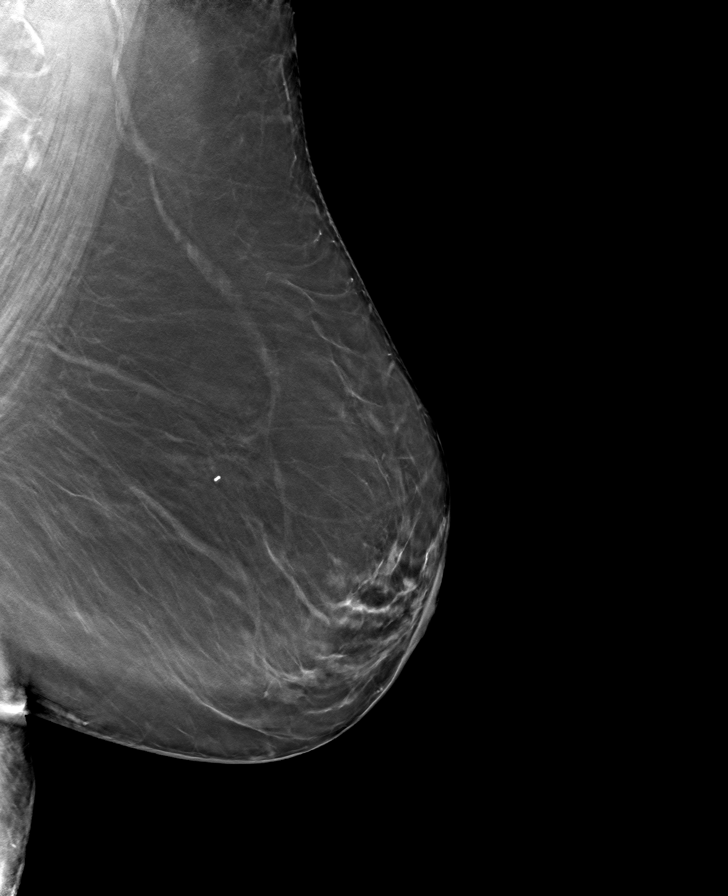

[R CC tomo · tomo slice 35/70.0]
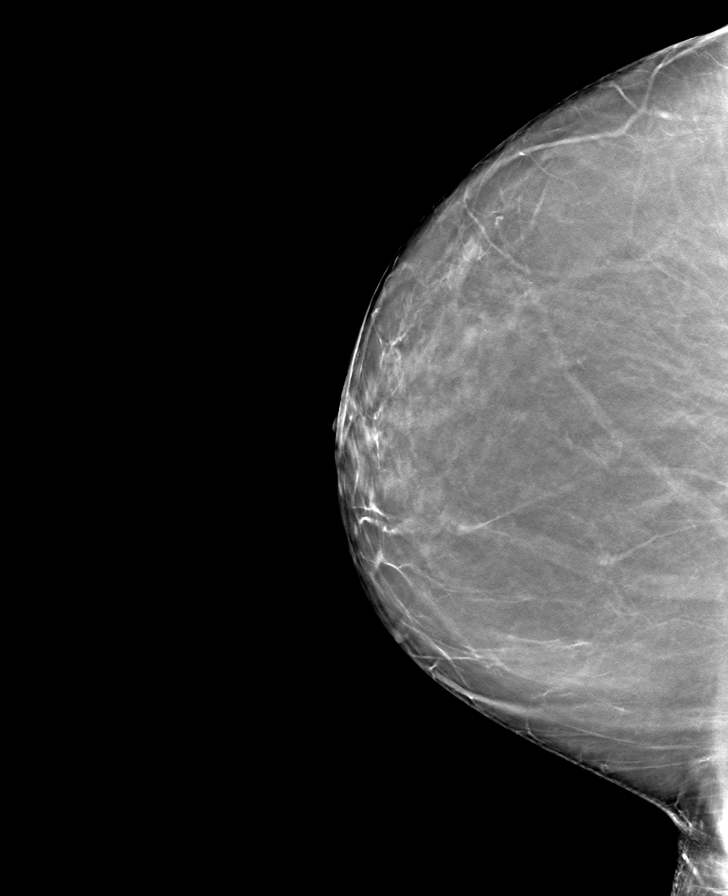

[R MLO tomo · tomo slice 41/81.0]
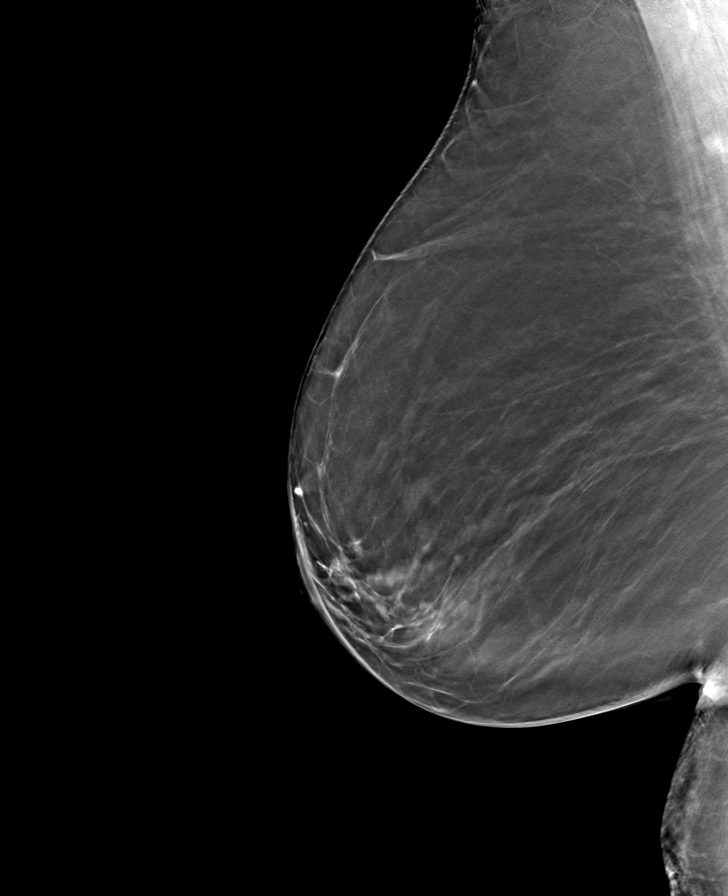

[8 of 24 positions shown; findings below may reference images not displayed]

ACR Breast Density Category b: There are scattered areas of
fibroglandular density.
FINDINGS: There are no findings suspicious for malignancy. Images were
processed with CAD.
IMPRESSION: No mammographic evidence of malignancy. A result letter of this
screening mammogram will be mailed directly to the patient.

RECOMMENDATION:
Screening mammogram in one year. (Code:[id number])

BI-RADS CATEGORY  1: Negative.

## 2021-02-23 ENCOUNTER — Other Ambulatory Visit: Payer: Self-pay | Admitting: *Deleted

## 2021-02-23 DIAGNOSIS — C8203 Follicular lymphoma grade I, intra-abdominal lymph nodes: Secondary | ICD-10-CM

## 2021-02-24 ENCOUNTER — Other Ambulatory Visit: Payer: Medicare Other

## 2021-02-24 ENCOUNTER — Ambulatory Visit: Payer: Medicare Other | Admitting: Internal Medicine

## 2021-02-25 ENCOUNTER — Other Ambulatory Visit: Payer: Self-pay

## 2021-02-25 ENCOUNTER — Inpatient Hospital Stay: Payer: Medicare Other | Attending: Internal Medicine

## 2021-02-25 ENCOUNTER — Inpatient Hospital Stay (HOSPITAL_BASED_OUTPATIENT_CLINIC_OR_DEPARTMENT_OTHER): Payer: Medicare Other | Admitting: Internal Medicine

## 2021-02-25 ENCOUNTER — Encounter: Payer: Self-pay | Admitting: Internal Medicine

## 2021-02-25 VITALS — BP 123/79 | HR 57 | Temp 97.0°F | Resp 20 | Ht 65.0 in | Wt 171.3 lb

## 2021-02-25 DIAGNOSIS — Z8051 Family history of malignant neoplasm of kidney: Secondary | ICD-10-CM | POA: Insufficient documentation

## 2021-02-25 DIAGNOSIS — Z833 Family history of diabetes mellitus: Secondary | ICD-10-CM | POA: Diagnosis not present

## 2021-02-25 DIAGNOSIS — M2550 Pain in unspecified joint: Secondary | ICD-10-CM | POA: Diagnosis not present

## 2021-02-25 DIAGNOSIS — M549 Dorsalgia, unspecified: Secondary | ICD-10-CM | POA: Insufficient documentation

## 2021-02-25 DIAGNOSIS — Z9049 Acquired absence of other specified parts of digestive tract: Secondary | ICD-10-CM | POA: Diagnosis not present

## 2021-02-25 DIAGNOSIS — Z85828 Personal history of other malignant neoplasm of skin: Secondary | ICD-10-CM | POA: Diagnosis not present

## 2021-02-25 DIAGNOSIS — Z811 Family history of alcohol abuse and dependence: Secondary | ICD-10-CM | POA: Insufficient documentation

## 2021-02-25 DIAGNOSIS — Z823 Family history of stroke: Secondary | ICD-10-CM | POA: Insufficient documentation

## 2021-02-25 DIAGNOSIS — Z79899 Other long term (current) drug therapy: Secondary | ICD-10-CM | POA: Diagnosis not present

## 2021-02-25 DIAGNOSIS — Z885 Allergy status to narcotic agent status: Secondary | ICD-10-CM | POA: Insufficient documentation

## 2021-02-25 DIAGNOSIS — Z90721 Acquired absence of ovaries, unilateral: Secondary | ICD-10-CM | POA: Insufficient documentation

## 2021-02-25 DIAGNOSIS — C8200 Follicular lymphoma grade I, unspecified site: Secondary | ICD-10-CM | POA: Insufficient documentation

## 2021-02-25 DIAGNOSIS — Z8719 Personal history of other diseases of the digestive system: Secondary | ICD-10-CM | POA: Diagnosis not present

## 2021-02-25 DIAGNOSIS — C8203 Follicular lymphoma grade I, intra-abdominal lymph nodes: Secondary | ICD-10-CM

## 2021-02-25 DIAGNOSIS — Z8249 Family history of ischemic heart disease and other diseases of the circulatory system: Secondary | ICD-10-CM | POA: Diagnosis not present

## 2021-02-25 DIAGNOSIS — Z808 Family history of malignant neoplasm of other organs or systems: Secondary | ICD-10-CM | POA: Insufficient documentation

## 2021-02-25 LAB — COMPREHENSIVE METABOLIC PANEL
ALT: 18 U/L (ref 0–44)
AST: 21 U/L (ref 15–41)
Albumin: 4.2 g/dL (ref 3.5–5.0)
Alkaline Phosphatase: 62 U/L (ref 38–126)
Anion gap: 7 (ref 5–15)
BUN: 21 mg/dL (ref 8–23)
CO2: 27 mmol/L (ref 22–32)
Calcium: 9.4 mg/dL (ref 8.9–10.3)
Chloride: 103 mmol/L (ref 98–111)
Creatinine, Ser: 0.82 mg/dL (ref 0.44–1.00)
GFR, Estimated: >60 mL/min (ref >60)
Glucose, Bld: 96 mg/dL (ref 70–99)
Potassium: 4 mmol/L (ref 3.5–5.1)
Sodium: 137 mmol/L (ref 135–145)
Total Bilirubin: 0.9 mg/dL (ref 0.3–1.2)
Total Protein: 7.4 g/dL (ref 6.5–8.1)

## 2021-02-25 LAB — CBC WITH DIFFERENTIAL/PLATELET
Abs Immature Granulocytes: 0.04 10*3/uL (ref 0.00–0.07)
Basophils Absolute: 0 10*3/uL (ref 0.0–0.1)
Basophils Relative: 0 %
Eosinophils Absolute: 0.2 10*3/uL (ref 0.0–0.5)
Eosinophils Relative: 2 %
HCT: 40.8 % (ref 36.0–46.0)
Hemoglobin: 13.8 g/dL (ref 12.0–15.0)
Immature Granulocytes: 1 %
Lymphocytes Relative: 44 %
Lymphs Abs: 3.1 10*3/uL (ref 0.7–4.0)
MCH: 29.3 pg (ref 26.0–34.0)
MCHC: 33.8 g/dL (ref 30.0–36.0)
MCV: 86.6 fL (ref 80.0–100.0)
Monocytes Absolute: 0.6 10*3/uL (ref 0.1–1.0)
Monocytes Relative: 9 %
Neutro Abs: 3.1 10*3/uL (ref 1.7–7.7)
Neutrophils Relative %: 44 %
Platelets: 273 10*3/uL (ref 150–400)
RBC: 4.71 MIL/uL (ref 3.87–5.11)
RDW: 12.9 % (ref 11.5–15.5)
WBC: 7 10*3/uL (ref 4.0–10.5)
nRBC: 0 % (ref 0.0–0.2)

## 2021-02-25 LAB — LACTATE DEHYDROGENASE: LDH: 108 U/L (ref 98–192)

## 2021-02-25 NOTE — Progress Notes (Signed)
[name] OFF[facility] PROGRESS NOTE  Patient Care Team: Marinda Elk, MD as PCP - General (Physician Assistant) Cammie Sickle, MD as Consulting Physician (Hematology and Oncology)  Cancer Staging No matching staging information was found for the patient.    Oncology History Overview Note    1.FEB 2013-  MESENTERIC LYMPHADENOPATHY;] Pet positive lymphadenopathy.; . 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 3. Rituxan weekly x4 finished in March of 2013 4. PET scan in July of 2013 complete remission. 5. Maintenance Rituxan started in July of 2013;  x 2 years [finished 2015]  #April 2019-melanoma/left side of the nose [2020]/ Left arm [2021][Dr. Kowalski/Dr.Dasher]   # LFTs- slightly up/ ? faty liver.  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  Ambulating independently.  80 year old patient above history for follow-up of lymphoma status post Rituxan maintenance was completed in 2015 is here for follow-up..  In the interim patient denies any new lymph nodes.  Denies any night sweats.  Denies any fevers or chills.  REVIEW OF SYSTEMS:  Review of Systems  Constitutional: Negative.  Negative for chills, fever, malaise/fatigue and weight loss.  HENT:  Negative for congestion, ear pain and tinnitus.   Eyes: Negative.  Negative for blurred vision and double vision.  Respiratory: Negative.  Negative for cough, sputum production and shortness of breath.   Cardiovascular: Negative.  Negative for chest pain, palpitations and leg swelling.  Gastrointestinal: Negative.  Negative for abdominal pain, constipation, diarrhea, nausea and vomiting.  Musculoskeletal:  Positive for back  pain and joint pain. Negative for falls.  Neurological: Negative.  Negative for weakness and headaches.  Endo/Heme/Allergies: Negative.  Does not bruise/bleed easily.  Psychiatric/Behavioral: Negative.  Negative for depression. The patient is not nervous/anxious and does not have insomnia.   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   Hyperlipidemia    Hypertension    Melanoma ([place])    Migraine    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 ([place]) 09/13/2014   Non Hodgkin's lymphoma ([place])    Non Hodgkin's lymphoma ([place])    RAD (reactive airway disease)    Skin cancer     PAST SURGICAL HISTORY :   Past Surgical History:  Procedure Laterality Date   ABDOMINAL HYSTERECTOMY     APPENDECTOMY     BASAL CELL CARCINOMA EXCISION     on both sides of nose   BREAST BIOPSY Left 12/2013   hyperplasia   CHOLECYSTECTOMY     COLONOSCOPY WITH PROPOFOL N/A 06/05/2017   Procedure: COLONOSCOPY WITH PROPOFOL;  Surgeon: Lollie Sails, MD;  Location: [facility] ENDOSCOPY;  Service: Endoscopy;  Laterality: N/A;   COLONOSCOPY WITH PROPOFOL N/A 05/14/2020   Procedure: COLONOSCOPY WITH PROPOFOL;  Surgeon: Lesly Rubenstein, MD;  Location: [facility] ENDOSCOPY;  Service:  Endoscopy;  Laterality: N/A;   ESOPHAGOGASTRODUODENOSCOPY (EGD) WITH PROPOFOL N/A 03/30/2015   Procedure: ESOPHAGOGASTRODUODENOSCOPY (EGD) WITH PROPOFOL;  Surgeon: Lollie Sails, MD;  Location: [facility] ENDOSCOPY;  Service: Endoscopy;  Laterality: N/A;   ESOPHAGOGASTRODUODENOSCOPY (EGD) WITH PROPOFOL N/A 06/05/2017   Procedure: ESOPHAGOGASTRODUODENOSCOPY (EGD) WITH PROPOFOL;  Surgeon: Lollie Sails, MD;   Location: [facility] ENDOSCOPY;  Service: Endoscopy;  Laterality: N/A;   ESOPHAGOGASTRODUODENOSCOPY (EGD) WITH PROPOFOL N/A 05/14/2020   Procedure: ESOPHAGOGASTRODUODENOSCOPY (EGD) WITH PROPOFOL;  Surgeon: Lesly Rubenstein, MD;  Location: [facility] ENDOSCOPY;  Service: Endoscopy;  Laterality: N/A;   FACIAL COSMETIC SURGERY     history of multiple colonic polyps     lymph node removal from left groin     OOPHORECTOMY     VENTRAL HERNIA REPAIR N/A 10/02/2014   Procedure: HERNIA REPAIR VENTRAL ADULT;  Surgeon: Leonie Green, MD;  Location: [facility] ORS;  Service: General;  Laterality: N/A;  with mesh    FAMILY HISTORY :   Family History  Problem Relation Age of Onset   Alcohol abuse Father    Stroke Father    Diabetes Sister    Kidney cancer Sister    Melanoma Brother    Diabetes Brother    Heart attack Brother    Breast cancer Neg Hx    Bladder Cancer Neg Hx    Prostate cancer Neg Hx     SOCIAL HISTORY:   Social History   Tobacco Use   Smoking status: Never   Smokeless tobacco: Never  Vaping Use   Vaping Use: Never used  Substance Use Topics   Alcohol use: Not Currently    Alcohol/week: 1.0 standard drink    Types: 1 Cans of beer per week    Comment: rare social occasions only   Drug use: Never    ALLERGIES:  is allergic to buprenorphine hcl and morphine and related.  MEDICATIONS:  Current Outpatient Medications  Medication Sig Dispense Refill   Calcium Carbonate-Vit D-Min (CALCIUM 1200 PO) Take 1 tablet by mouth 1 day or 1 dose.     Cholecalciferol (VITAMIN D3 GUMMIES) 25 MCG (1000 UT) CHEW Chew 1,000 Units by mouth daily.     lisinopril (PRINIVIL,ZESTRIL) 10 MG tablet Take 10 mg by mouth daily.     loratadine (CLARITIN) 10 MG tablet Take by mouth.     omeprazole (PRILOSEC) 20 MG capsule Take 20 mg by mouth daily.     rosuvastatin (CRESTOR) 5 MG tablet Take by mouth.     No current facility-administered medications for this visit.    PHYSICAL EXAMINATION: ECOG  PERFORMANCE STATUS: 0 - Asymptomatic  BP 123/79   Pulse (!) 57   Temp (!) 97 F (36.1 C) (Tympanic)   Resp 20   Ht 5\' 5"  (1.651 m)   Wt 171 lb 4.8 oz (77.7 kg)   BMI 28.51 kg/m   Filed Weights   02/25/21 1007  Weight: 171 lb 4.8 oz (77.7 kg)   Physical Exam Constitutional:      Comments: Obese.  Walk independently.  HENT:     Head: Normocephalic and atraumatic.     Mouth/Throat:     Pharynx: No oropharyngeal exudate.  Eyes:     Pupils: Pupils are equal, round, and reactive to light.  Cardiovascular:     Rate and Rhythm: Normal rate and regular rhythm.  Pulmonary:     Effort: Pulmonary effort is normal. No respiratory distress.     Breath sounds: Normal breath sounds. No wheezing.  Abdominal:  General: Bowel sounds are normal. There is no distension.     Palpations: Abdomen is soft. There is no mass.     Tenderness: There is no abdominal tenderness. There is no guarding or rebound.  Musculoskeletal:        General: No tenderness. Normal range of motion.     Cervical back: Normal range of motion and neck supple.  Skin:    General: Skin is warm.  Neurological:     Mental Status: She is alert and oriented to person, place, and time.  Psychiatric:        Mood and Affect: Affect normal.   LABORATORY DATA:  I have reviewed the data as listed    Component Value Date/Time   NA 137 02/25/2021 0933   NA 141 03/03/2014 1022   K 4.0 02/25/2021 0933   K 4.1 03/03/2014 1022   CL 103 02/25/2021 0933   CL 105 03/03/2014 1022   CO2 27 02/25/2021 0933   CO2 28 03/03/2014 1022   GLUCOSE 96 02/25/2021 0933   GLUCOSE 112 (H) 03/03/2014 1022   BUN 21 02/25/2021 0933   BUN 18 03/03/2014 1022   CREATININE 0.82 02/25/2021 0933   CREATININE 0.94 03/03/2014 1022   CALCIUM 9.4 02/25/2021 0933   CALCIUM 9.4 03/03/2014 1022   PROT 7.4 02/25/2021 0933   PROT 7.4 03/03/2014 1022   ALBUMIN 4.2 02/25/2021 0933   ALBUMIN 4.1 03/03/2014 1022   AST 21 02/25/2021 0933   AST 29  03/03/2014 1022   ALT 18 02/25/2021 0933   ALT 53 03/03/2014 1022   ALKPHOS 62 02/25/2021 0933   ALKPHOS 81 03/03/2014 1022   BILITOT 0.9 02/25/2021 0933   BILITOT 0.4 03/03/2014 1022   GFRNONAA >60 02/25/2021 0933   GFRNONAA >60 03/03/2014 1022   GFRNONAA >60 09/02/2013 0939   GFRAA >60 08/21/2019 1031   GFRAA >60 03/03/2014 1022   GFRAA >60 09/02/2013 0939    No results found for: SPEP, UPEP  Lab Results  Component Value Date   WBC 7.0 02/25/2021   NEUTROABS 3.1 02/25/2021   HGB 13.8 02/25/2021   HCT 40.8 02/25/2021   MCV 86.6 02/25/2021   PLT 273 02/25/2021      Chemistry      Component Value Date/Time   NA 137 02/25/2021 0933   NA 141 03/03/2014 1022   K 4.0 02/25/2021 0933   K 4.1 03/03/2014 1022   CL 103 02/25/2021 0933   CL 105 03/03/2014 1022   CO2 27 02/25/2021 0933   CO2 28 03/03/2014 1022   BUN 21 02/25/2021 0933   BUN 18 03/03/2014 1022   CREATININE 0.82 02/25/2021 0933   CREATININE 0.94 03/03/2014 1022      Component Value Date/Time   CALCIUM 9.4 02/25/2021 0933   CALCIUM 9.4 03/03/2014 1022   ALKPHOS 62 02/25/2021 0933   ALKPHOS 81 03/03/2014 1022   AST 21 02/25/2021 0933   AST 29 03/03/2014 1022   ALT 18 02/25/2021 0933   ALT 53 03/03/2014 1022   BILITOT 0.9 02/25/2021 0933   BILITOT 0.4 03/03/2014 1022       RADIOGRAPHIC STUDIES: I have personally reviewed the radiological images as listed and agreed with the findings in the report. No results found.   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  CT scan July 2020-negative for any lymphadenopathy- STABLE.  Labs today within normal limits.  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 STABLE.   # Disposition:  # follow-up 6 m-MD /lab-CBC CMP [facility]- Dr.B   Orders Placed This Encounter  Procedures   CBC with Differential    Standing Status:   Future    Standing Expiration Date:   02/25/2022   Comprehensive metabolic panel    Standing Status:   Future    Standing Expiration Date:   02/25/2022   Lactate dehydrogenase    Standing Status:   Future    Standing Expiration Date:   02/25/2022     Cammie Sickle, MD 02/25/2021 5:06 PM

## 2021-02-25 NOTE — Assessment & Plan Note (Addendum)
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  CT scan July 2020-negative for any lymphadenopathy- STABLE.  Labs today within normal limits.  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 STABLE.   # Disposition:  # follow-up 6 m-MD /lab-CBC CMP [facility]- Dr.B

## 2021-06-16 ENCOUNTER — Other Ambulatory Visit: Payer: Self-pay

## 2021-06-16 ENCOUNTER — Observation Stay
Admission: EM | Admit: 2021-06-16 | Discharge: 2021-06-18 | Disposition: A | Discharge status: Home / Self Care | Payer: Medicare Other | Attending: Family Medicine | Admitting: Family Medicine

## 2021-06-16 ENCOUNTER — Emergency Department: Payer: Medicare Other

## 2021-06-16 DIAGNOSIS — E785 Hyperlipidemia, unspecified: Secondary | ICD-10-CM | POA: Diagnosis not present

## 2021-06-16 DIAGNOSIS — K219 Gastro-esophageal reflux disease without esophagitis: Secondary | ICD-10-CM | POA: Insufficient documentation

## 2021-06-16 DIAGNOSIS — K5651 Intestinal adhesions [bands], with partial obstruction: Secondary | ICD-10-CM | POA: Diagnosis not present

## 2021-06-16 DIAGNOSIS — R109 Unspecified abdominal pain: Secondary | ICD-10-CM | POA: Diagnosis present

## 2021-06-16 DIAGNOSIS — K56600 Partial intestinal obstruction, unspecified as to cause: Secondary | ICD-10-CM | POA: Diagnosis not present

## 2021-06-16 DIAGNOSIS — R8281 Pyuria: Secondary | ICD-10-CM | POA: Diagnosis not present

## 2021-06-16 DIAGNOSIS — I10 Essential (primary) hypertension: Secondary | ICD-10-CM | POA: Diagnosis not present

## 2021-06-16 DIAGNOSIS — Z20822 Contact with and (suspected) exposure to covid-19: Secondary | ICD-10-CM | POA: Insufficient documentation

## 2021-06-16 DIAGNOSIS — Z79899 Other long term (current) drug therapy: Secondary | ICD-10-CM | POA: Diagnosis not present

## 2021-06-16 LAB — CBC
HCT: 41.8 % (ref 36.0–46.0)
Hemoglobin: 14.5 g/dL (ref 12.0–15.0)
MCH: 29.2 pg (ref 26.0–34.0)
MCHC: 34.7 g/dL (ref 30.0–36.0)
MCV: 84.3 fL (ref 80.0–100.0)
Platelets: 281 10*3/uL (ref 150–400)
RBC: 4.96 MIL/uL (ref 3.87–5.11)
RDW: 13.2 % (ref 11.5–15.5)
WBC: 10.5 10*3/uL (ref 4.0–10.5)
nRBC: 0 % (ref 0.0–0.2)

## 2021-06-16 LAB — COMPREHENSIVE METABOLIC PANEL
ALT: 20 U/L (ref 0–44)
AST: 21 U/L (ref 15–41)
Albumin: 4.3 g/dL (ref 3.5–5.0)
Alkaline Phosphatase: 50 U/L (ref 38–126)
Anion gap: 9 (ref 5–15)
BUN: 18 mg/dL (ref 8–23)
CO2: 20 mmol/L — ABNORMAL LOW (ref 22–32)
Calcium: 9.5 mg/dL (ref 8.9–10.3)
Chloride: 106 mmol/L (ref 98–111)
Creatinine, Ser: 0.75 mg/dL (ref 0.44–1.00)
GFR, Estimated: >60 mL/min (ref >60)
Glucose, Bld: 99 mg/dL (ref 70–99)
Potassium: 4.1 mmol/L (ref 3.5–5.1)
Sodium: 135 mmol/L (ref 135–145)
Total Bilirubin: 0.6 mg/dL (ref 0.3–1.2)
Total Protein: 7.5 g/dL (ref 6.5–8.1)

## 2021-06-16 LAB — URINALYSIS, ROUTINE W REFLEX MICROSCOPIC
Bilirubin Urine: NEGATIVE
Glucose, UA: NEGATIVE mg/dL
Ketones, ur: NEGATIVE mg/dL
Nitrite: NEGATIVE
Protein, ur: NEGATIVE mg/dL
Specific Gravity, Urine: 1.011 (ref 1.005–1.030)
WBC, UA: >50 WBC/hpf — ABNORMAL HIGH (ref 0–5)
pH: 5 (ref 5.0–8.0)

## 2021-06-16 LAB — GLUCOSE, CAPILLARY: Glucose-Capillary: 115 mg/dL — ABNORMAL HIGH (ref 70–99)

## 2021-06-16 LAB — LIPASE, BLOOD: Lipase: 31 U/L (ref 11–51)

## 2021-06-16 LAB — RESP PANEL BY RT-PCR (FLU A&B, COVID) ARPGX2
Influenza A by PCR: NEGATIVE
Influenza B by PCR: NEGATIVE
SARS Coronavirus 2 by RT PCR: NEGATIVE

## 2021-06-16 MED ORDER — FENTANYL CITRATE PF 50 MCG/ML IJ SOSY
25.0000 ug | PREFILLED_SYRINGE | Freq: Once | INTRAMUSCULAR | Status: AC
  Administered 2021-06-16: 25 ug via INTRAVENOUS
  Filled 2021-06-16: qty 1

## 2021-06-16 MED ORDER — PANTOPRAZOLE SODIUM 40 MG PO TBEC
40.0000 mg | DELAYED_RELEASE_TABLET | Freq: Every day | ORAL | Status: DC
  Administered 2021-06-17 – 2021-06-18 (×2): 40 mg via ORAL
  Filled 2021-06-16 (×2): qty 1

## 2021-06-16 MED ORDER — ROSUVASTATIN CALCIUM 5 MG PO TABS
5.0000 mg | ORAL_TABLET | Freq: Every day | ORAL | Status: DC
Start: 2021-06-17 — End: 2021-06-18
  Administered 2021-06-17 – 2021-06-18 (×2): 5 mg via ORAL
  Filled 2021-06-16 (×2): qty 1

## 2021-06-16 MED ORDER — ONDANSETRON HCL 4 MG/2ML IJ SOLN
4.0000 mg | Freq: Once | INTRAMUSCULAR | Status: AC
  Administered 2021-06-16: 4 mg via INTRAVENOUS
  Filled 2021-06-16: qty 2

## 2021-06-16 MED ORDER — ONDANSETRON HCL 4 MG PO TABS: 4.0000 mg | ORAL_TABLET | Freq: Four times a day (QID) | ORAL | Status: DC | PRN

## 2021-06-16 MED ORDER — ONDANSETRON HCL 4 MG/2ML IJ SOLN: 4.0000 mg | Freq: Four times a day (QID) | INTRAMUSCULAR | Status: DC | PRN

## 2021-06-16 MED ORDER — HYDROMORPHONE HCL 1 MG/ML IJ SOLN
0.4000 mg | INTRAMUSCULAR | Status: DC | PRN
  Administered 2021-06-16: 0.4 mg via INTRAVENOUS
  Filled 2021-06-16: qty 1

## 2021-06-16 MED ORDER — LISINOPRIL 10 MG PO TABS
10.0000 mg | ORAL_TABLET | Freq: Every day | ORAL | Status: DC
  Administered 2021-06-17 – 2021-06-18 (×2): 10 mg via ORAL
  Filled 2021-06-16 (×2): qty 1

## 2021-06-16 MED ORDER — ENOXAPARIN SODIUM 40 MG/0.4ML IJ SOSY
40.0000 mg | PREFILLED_SYRINGE | INTRAMUSCULAR | Status: DC
Start: 2021-06-16 — End: 2021-06-18
  Administered 2021-06-16 – 2021-06-17 (×2): 40 mg via SUBCUTANEOUS
  Filled 2021-06-16 (×2): qty 0.4

## 2021-06-16 MED ORDER — ACETAMINOPHEN 325 MG PO TABS: 650.0000 mg | ORAL_TABLET | Freq: Four times a day (QID) | ORAL | Status: DC | PRN

## 2021-06-16 MED ORDER — IOHEXOL 300 MG/ML  SOLN
100.0000 mL | Freq: Once | INTRAMUSCULAR | Status: AC | PRN
  Administered 2021-06-16: 100 mL via INTRAVENOUS
  Filled 2021-06-16: qty 100

## 2021-06-16 MED ORDER — LACTATED RINGERS IV SOLN: INTRAVENOUS | Status: DC

## 2021-06-16 MED ORDER — HYDROMORPHONE HCL 2 MG PO TABS: 1.0000 mg | ORAL_TABLET | ORAL | Status: DC | PRN

## 2021-06-16 MED ORDER — SODIUM CHLORIDE 0.9 % IV BOLUS
500.0000 mL | Freq: Once | INTRAVENOUS | Status: AC
  Administered 2021-06-16: 500 mL via INTRAVENOUS

## 2021-06-16 MED ORDER — ACETAMINOPHEN 650 MG RE SUPP: 650.0000 mg | Freq: Four times a day (QID) | RECTAL | Status: DC | PRN

## 2021-06-16 NOTE — Assessment & Plan Note (Signed)
Continue Crestor 

## 2021-06-16 NOTE — ED Notes (Signed)
ED Charge nurse notified that pt has not had a inpatient nurse assigned to this pt as pt bed was assigned at 1739. ED charge nurse to call to the 1A.

## 2021-06-16 NOTE — ED Notes (Signed)
RN called to the floor to get assignment. RN talked to charge nurse of 1A and she sts that she is looking over the pt chart at this time to assign a nurse this pt.

## 2021-06-16 NOTE — ED Notes (Signed)
ED charge called RN and sts that inpatient nurse should be assigned shortly.

## 2021-06-16 NOTE — ED Provider Notes (Signed)
[facility] Provider Note    Event Date/Time   First MD Initiated Contact with Patient 06/16/21 1435     (approximate)   History   Abdominal Pain   HPI  [name] is a 81 y.o. female who presents to the ED for evaluation of Abdominal Pain   I reviewed outpatient PCP visit from 10/10.  History of obesity, HTN, GERD.  History of ventral hernia repairs, hysterectomy, cholecystectomy and intra-abdominal lymph node removal due to non-Hodgkin's lymphoma.  Patient presents to the ED for the evaluation of upper abdominal pain with associated diarrhea and abdominal bloating sensation.  She reports this was a concern for a small bowel obstruction, as her pain and symptoms are reminiscent of previous episodes of this.  She reports nausea without emesis.  Reports 4 bowel movements today, 3 of which were liquid without hematochezia or melena.  Denies dysuria, urinary frequency, foul-smelling urine or sensation of incomplete emptying.  She reports her urine "always looks like that" that she attributes to chronic inflammation to her vulva.  Physical Exam   Triage Vital Signs: ED Triage Vitals  Enc Vitals Group     BP 06/16/21 1402 (!) 142/94     Pulse Rate 06/16/21 1402 85     Resp 06/16/21 1402 18     Temp 06/16/21 1402 97.6 F (36.4 C)     Temp Source 06/16/21 1402 Oral     SpO2 06/16/21 1402 95 %     Weight 06/16/21 1401 172 lb (78 kg)     Height 06/16/21 1401 5\' 2"  (1.575 m)     Head Circumference --      Peak Flow --      Pain Score 06/16/21 1407 6     Pain Loc --      Pain Edu? --      Excl. in [place]? --     Most recent vital signs: Vitals:   06/16/21 1402  BP: (!) 142/94  Pulse: 85  Resp: 18  Temp: 97.6 F (36.4 C)  SpO2: 95%    General: Awake, no distress.  CV:  Good peripheral perfusion.  Resp:  Normal effort.  Abd:  Mild distention.  Diffuse upper abdominal tenderness with voluntary guarding.  Lesser tenderness to the lower abdomen  without guarding. MSK:  No deformity noted.  Neuro:  No focal deficits appreciated. Other:     ED Results / Procedures / Treatments   Labs (all labs ordered are listed, but only abnormal results are displayed) Labs Reviewed  COMPREHENSIVE METABOLIC PANEL - Abnormal; Notable for the following components:      Result Value   CO2 20 (*)    All other components within normal limits  URINALYSIS, ROUTINE W REFLEX MICROSCOPIC - Abnormal; Notable for the following components:   Color, Urine YELLOW (*)    APPearance CLOUDY (*)    Hgb urine dipstick SMALL (*)    Leukocytes,Ua LARGE (*)    WBC, UA >50 (*)    Bacteria, UA MANY (*)    All other components within normal limits  URINE CULTURE  LIPASE, BLOOD  CBC    EKG   RADIOLOGY CT abdomen/pelvis reviewed by me with proximal small bowel dilatation  Official radiology report(s): CT ABDOMEN PELVIS W CONTRAST  Result Date: 06/16/2021 CLINICAL DATA:  Upper abdominal pain and bloating. EXAM: CT ABDOMEN AND PELVIS WITH CONTRAST TECHNIQUE: Multidetector CT imaging of the abdomen and pelvis was performed using the standard protocol following bolus  administration of intravenous contrast. RADIATION DOSE REDUCTION: This exam was performed according to the departmental dose-optimization program which includes automated exposure control, adjustment of the mA and/or kV according to patient size and/or use of iterative reconstruction technique. CONTRAST:  157mL OMNIPAQUE IOHEXOL 300 MG/ML  SOLN COMPARISON:  May 31, 2020. FINDINGS: Lower chest: No acute abnormality. Hepatobiliary: Status post cholecystectomy. No biliary dilatation is noted. Stable hepatic cysts are noted. Pancreas: Unremarkable. No pancreatic ductal dilatation or surrounding inflammatory changes. Spleen: Normal in size without focal abnormality. Adrenals/Urinary Tract: Adrenal glands are unremarkable. Kidneys are normal, without renal calculi, focal lesion, or hydronephrosis. Bladder is  unremarkable. Stomach/Bowel: Status post appendectomy. The stomach is unremarkable. No colonic dilatation is noted. Moderate proximal small bowel dilatation is noted without definite transition zone. Is uncertain if this represents ileus or possibly partial bowel obstruction. Vascular/Lymphatic: No significant vascular findings are present. No enlarged abdominal or pelvic lymph nodes. Reproductive: Status post hysterectomy. No adnexal masses. Other: No abdominal wall hernia or abnormality. No abdominopelvic ascites. Musculoskeletal: No acute or significant osseous findings. IMPRESSION: Moderate proximal small bowel dilatation is noted without definite transition zone. It is uncertain if this represents ileus or possibly partial small bowel obstruction. Status post cholecystectomy. Stable hepatic cysts. Electronically Signed   By: Marijo Conception M.D.   On: 06/16/2021 16:38    PROCEDURES and INTERVENTIONS:  Procedures  Medications  lactated ringers infusion (has no administration in time range)  fentaNYL (SUBLIMAZE) injection 25 mcg (has no administration in time range)  ondansetron (ZOFRAN) injection 4 mg (4 mg Intravenous Given 06/16/21 1557)  fentaNYL (SUBLIMAZE) injection 25 mcg (25 mcg Intravenous Given 06/16/21 1558)  iohexol (OMNIPAQUE) 300 MG/ML solution 100 mL (100 mLs Intravenous Contrast Given 06/16/21 1619)     IMPRESSION / MDM / ASSESSMENT AND PLAN / ED COURSE  I reviewed the triage vital signs and the nursing notes.  81 year old female with multiple intra-abdominal surgeries presented to the ED with bloating, pain and diarrhea with evidence of a partial small bowel obstruction requiring medical observation admission.  Blood work with slight decrease in bicarb, suggestive of dehydration, but otherwise normal CBC and lipase.  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   We will obtain from antibiotics at this time considering her lack of symptoms and send her urine for a culture.  CT obtained due to her intra-abdominal surgical history and with evidence of a partial small bowel obstruction.  She is still quite uncomfortable, so we will consult with medicine for observation admission.  Clinical Course as of 06/16/21 1655  Thu Jun 16, 2021  1655 Reassessed.  Still quite uncomfortable.  We discussed CT results and she is requesting admission.  We will consult with hospitalist. [DS]    Clinical Course User Index [DS] Vladimir Crofts, MD     FINAL CLINICAL IMPRESSION(S) / ED DIAGNOSES   Final diagnoses:  Partial small bowel obstruction ([place])     Rx / DC Orders   ED Discharge Orders     None        Note:  This document was prepared using Dragon voice recognition software and may include unintentional dictation errors.   Vladimir Crofts, MD 06/16/21 [phone]

## 2021-06-16 NOTE — Assessment & Plan Note (Signed)
Asymptomatic, no treatment needed

## 2021-06-16 NOTE — Hospital Course (Addendum)
[name] is a 81 y.o. F with hx HTN, hyperlipidemia, NHL, as well as hx cholecystectomy and abdominal lymph node biopsy with recurrent SBO who presented with abdominal bloating and pain for 1 day.  Started to have colicky epigastric pain, bloating sensation, nausea, and several loose stools.  This felt like her previous small bowel obstructions, so she came to the ER.  In the ER, electrolytes and hemogram normal.  CT of the abdomen and pelvis showed possible partial small bowel obstruction.  She was started on fluids

## 2021-06-16 NOTE — ED Notes (Signed)
ED charge nurse notified that no inpatient nurse assigned to pt at this time for report. Per charge call to the floor to get the info please.

## 2021-06-16 NOTE — Assessment & Plan Note (Signed)
Continue lisinopril

## 2021-06-16 NOTE — H&P (Signed)
History and Physical    Patient: [name] FHL:[id number] DOB: [birth year] DOA: 06/16/2021 DOS: the patient was seen and examined on 06/16/2021 PCP: Marinda Elk, MD  Patient coming from: Home  Chief Complaint:  Chief Complaint  Patient presents with   Abdominal Pain    HPI:  [name] is a 81 y.o. F with hx HTN, hyperlipidemia, NHL, as well as hx cholecystectomy and abdominal lymph node biopsy with recurrent SBO who presented with abdominal bloating and pain for 1 day.  Patient was in her usual state of health until the day, started to have colicky epigastric pain, bloating sensation, nausea, and several loose stools.  This felt like her previous small bowel obstructions, so she came to the ER.  In the ER, electrolytes and hemogram normal.  CT of the abdomen and pelvis showed possible partial small bowel obstruction.  She was started on fluids and the hospitalist service were asked to evaluate.           Review of Systems: Review of Systems  Constitutional:  Negative for chills and fever.  Gastrointestinal:  Positive for abdominal pain, diarrhea and nausea. Negative for blood in stool, constipation, melena and vomiting.  Genitourinary:  Negative for dysuria, frequency, hematuria and urgency.  All other systems reviewed and are negative.     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   Non Hodgkin's lymphoma ([place])    Non Hodgkin's lymphoma ([place])    RAD (reactive airway disease)    Skin cancer    Past Surgical  History:  Procedure Laterality Date   ABDOMINAL HYSTERECTOMY     APPENDECTOMY     BASAL CELL CARCINOMA EXCISION     on both sides of nose   BREAST BIOPSY Left 12/2013   hyperplasia   CHOLECYSTECTOMY     COLONOSCOPY WITH PROPOFOL N/A 06/05/2017   Procedure: COLONOSCOPY WITH PROPOFOL;  Surgeon: Lollie Sails, MD;  Location[facility] END[facility]COPY;  Service: Endoscopy;  Laterality: N/A;   COLONOSCOPY WITH PROPOFOL N/A 05/14/2020   Procedure: COLONOSCOPY WITH PROPOFOL;  Surgeon: Lesly Rubenstein, MD;  Location: [facility] ENDOSCOPY;  Service: Endoscopy;  Laterality: N/A;   ESOPHAGOGASTRODUODENOSCOPY (EGD) WITH PROPOFOL N/A 03/30/2015   Procedure: ESOPHAGOGASTRODUODENOSCOPY (EGD) WITH PROPOFOL;  Surgeon: Lollie Sails, MD;  Location: [facility] ENDOSCOPY;  Service: Endoscopy;  Laterality: N/A;   ESOPHAGOGASTRODUODENOSCOPY (EGD) WITH PROPOFOL N/A 06/05/2017   Procedure: ESOPHAGOGASTRODUODENOSCOPY (EGD) WITH PROPOFOL;  Surgeon: Lollie Sails, MD;  Location: [facility] ENDOSCOPY;  Service: Endoscopy;  Laterality: N/A;   ESOPHAGOGASTRODUODENOSCOPY (EGD) WITH PROPOFOL N/A 05/14/2020   Procedure: ESOPHAGOGASTRODUODENOSCOPY (EGD) WITH PROPOFOL;  Surgeon: Lesly Rubenstein, MD;  Location: [facility] ENDOSCOPY;  Service: Endoscopy;  Laterality: N/A;   FACIAL COSMETIC SURGERY     history of multiple colonic polyps     lymph node removal from left groin     OOPHORECTOMY     VENTRAL HERNIA REPAIR N/A 10/02/2014  Procedure: HERNIA REPAIR VENTRAL ADULT;  Surgeon: Leonie Green, MD;  Location: [facility] ORS;  Service: General;  Laterality: N/A;  with mesh   Social History:  reports that she has never smoked. She has never used smokeless tobacco. She reports that she does not currently use alcohol after a past usage of about 1.0 standard drink per week. She reports that she does not use drugs.  Allergies  Allergen Reactions   Buprenorphine Hcl Nausea And Vomiting   Morphine And Related Nausea And Vomiting    Family  History  Problem Relation Age of Onset   Alcohol abuse Father    Stroke Father    Diabetes Sister    Kidney cancer Sister    Melanoma Brother    Diabetes Brother    Heart attack Brother    Breast cancer Neg Hx    Bladder Cancer Neg Hx    Prostate cancer Neg Hx     Prior to Admission medications   Medication Sig Start Date End Date Taking? Authorizing Provider  Calcium Carbonate-Vit D-Min (CALCIUM 1200 PO) Take 1 tablet by mouth 1 day or 1 dose.    [provider]  Cholecalciferol (VITAMIN D3 GUMMIES) 25 MCG (1000 UT) CHEW Chew 1,000 Units by mouth daily.    [provider]  lisinopril (PRINIVIL,ZESTRIL) 10 MG tablet Take 10 mg by mouth daily. 09/21/16   [provider]  loratadine (CLARITIN) 10 MG tablet Take by mouth.    [provider]  omeprazole (PRILOSEC) 20 MG capsule Take 20 mg by mouth daily. 06/08/15   [provider]  rosuvastatin (CRESTOR) 5 MG tablet Take by mouth. 08/12/20 08/12/21  [provider]    Physical Exam: Vitals:   06/16/21 1401 06/16/21 1402  BP:  (!) 142/94  Pulse:  85  Resp:  18  Temp:  97.6 F (36.4 C)  TempSrc:  Oral  SpO2:  95%  Weight: 78 kg   Height: 5\' 2"  (1.575 m)    General appearance: Adult female, lying in bed, appears uncomfortable, talking on the phone.     HEENT: Anicteric, conjunctive a pink, lids and lashes normal.  No nasal deformity, discharge, or epistaxis.  Dentition in good repair, oropharynx moist, no oral lesions, lips normal Skin: No suspicious rashes or lesions. Cardiac: Normal rate and rhythm, no murmurs, no lower extremity edema, no JVD Respiratory: Normal respiratory rate and rhythm, lungs clear without rales or wheezes Abdomen: Abdomen soft, no voluntary guarding except with deep palpation in the right-side or the epigastrium, no rigidity or rebound. MSK:  Neuro: Awake and alert, extraocular movements intact, face symmetric, moves upper extremities with generalized  weakness but symmetric strength, speech fluent Psych: Attention normal, affect normal, judgment Syprine normal   Data Reviewed: My review of labs and imaging is notable for normal electrolyte panel, normal renal function, normal hemogram. CT abdomen and pelvis report was reviewed, shows partial small bowel obstruction.         Assessment and Plan: * Partial intestinal obstruction ([place])- (present on admission) She has colicky abdominal pain, bloating, and nausea with CT imaging suggesting a partial small bowel obstruction. -N.p.o. - IVF - P.o. or IV Dilaudid as needed  Pyuria Asymptomatic, no treatment needed  Essential hypertension - Continue lisinopril  HLD (hyperlipidemia)- (present on admission) - Continue Crestor       Advance Care Planning: Full, presumed  Consults: None  Family Communication: Husband at the bedside  Severity of Illness: The appropriate patient status for  this patient is OBSERVATION. Observation status is judged to be reasonable and necessary in order to provide the required intensity of service to ensure the patient's safety. The patient's presenting symptoms, physical exam findings, and initial radiographic and laboratory data in the context of their medical condition is felt to place them at decreased risk for further clinical deterioration. Furthermore, it is anticipated that the patient will be medically stable for discharge from the hospital within 2 midnights of admission.   Author: Edwin Dada, MD 06/16/2021 5:40 PM  For on call review [email or web address].

## 2021-06-16 NOTE — ED Triage Notes (Signed)
Pt to ED for mid abd pain, reports hx of bowel obstruction and states feels similar. Denies n/v/d.  Nad noted, texting on phone in triage.

## 2021-06-16 NOTE — ED Notes (Signed)
When pt prompted to take deep breaths sat inc to 94%; pt otherwise continues to dec to 91% RA as stops taking deep breaths; placed on 2L via [place].

## 2021-06-16 NOTE — ED Notes (Signed)
RN notified ED charge that there has not been a inpatient nurse assigned to this pt yet. ED charge said that she will call to the floor for an assignment.

## 2021-06-16 NOTE — ED Notes (Signed)
RN called ED charge and notified her that inpatient nurse has not been assigned at this time. Per charge nurse, I will call to 1A.

## 2021-06-16 NOTE — Assessment & Plan Note (Addendum)
Admitted and started on fluids. This morning was able to take some p.o., but then developed abdominal pain.  Will back off and try again tomorrow.    -Dilaudid as needed - Ondansetron as needed - Pause advancement of diet

## 2021-06-17 DIAGNOSIS — I10 Essential (primary) hypertension: Secondary | ICD-10-CM | POA: Diagnosis not present

## 2021-06-17 DIAGNOSIS — R8281 Pyuria: Secondary | ICD-10-CM

## 2021-06-17 DIAGNOSIS — K5651 Intestinal adhesions [bands], with partial obstruction: Secondary | ICD-10-CM | POA: Diagnosis not present

## 2021-06-17 DIAGNOSIS — K56600 Partial intestinal obstruction, unspecified as to cause: Secondary | ICD-10-CM | POA: Diagnosis not present

## 2021-06-17 LAB — MAGNESIUM: Magnesium: 1.9 mg/dL (ref 1.7–2.4)

## 2021-06-17 MED ORDER — LOPERAMIDE HCL 2 MG PO CAPS
2.0000 mg | ORAL_CAPSULE | ORAL | Status: DC | PRN
  Administered 2021-06-17 (×2): 2 mg via ORAL
  Filled 2021-06-17 (×2): qty 1

## 2021-06-17 NOTE — Progress Notes (Signed)
°  Progress Note   Patient: [name] KZL:[id number] DOB: Nov 12, 1940 DOA: 06/16/2021     0 DOS: the patient was seen and examined on 06/17/2021   Brief hospital course: Mrs. Castles is a 81 y.o. F with hx HTN, hyperlipidemia, NHL, as well as hx cholecystectomy and abdominal lymph node biopsy with recurrent SBO who presented with abdominal bloating and pain for 1 day.  Started to have colicky epigastric pain, bloating sensation, nausea, and several loose stools.  This felt like her previous small bowel obstructions, so she came to the ER.  In the ER, electrolytes and hemogram normal.  CT of the abdomen and pelvis showed possible partial small bowel obstruction.  She was started on fluids      Assessment and Plan: * Partial intestinal obstruction ([place])- (present on admission) Admitted and started on fluids. This morning was able to take some p.o., but then developed abdominal pain.  Will back off and try again tomorrow.    -Dilaudid as needed - Ondansetron as needed - Pause advancement of diet   Pyuria Asymptomatic, no treatment needed  Essential hypertension - Continue lisinopril  HLD (hyperlipidemia)- (present on admission) - Continue Crestor        Subjective: Patient having a lot of diarrhea, watery stools today.  Also developing abdominal discomfort after eating.  Physical Exam: Vitals:   06/16/21 2310 06/17/21 0438 06/17/21 0731 06/17/21 1605  BP: 113/75 105/63 117/80 109/62  Pulse: 63 73 77 71  Resp:  16 15 14   Temp:  97.9 F (36.6 C) 98.2 F (36.8 C) 97.7 F (36.5 C)  TempSrc:      SpO2: 91% 95% 94% 99%  Weight:      Height:       Adult female, lying in bed, no obvious distress RRR, no murmurs, no lower extremity edema Normal respiratory rate and rhythm, lungs clear without rales or wheezes Abdomen tender, more on the right side, tender Awake and alert, extraocular movements intact, face symmetric, speech fluent Attention normal, affect normal, judgment  and insight appear normal  Data Reviewed: Labs notable for magnesium 1.9, glucose normal  Family Communication:   Disposition: Status is: Observation Patient does not tolerate advancement of diet, will back off and observe overnight        Planned Discharge Destination: Home       Author: Edwin Dada, MD 06/17/2021 4:56 PM  For on call review [email or web address].

## 2021-06-17 NOTE — Care Management Obs Status (Signed)
[facility] NOTIFICATION   Patient Details  Name: [name] MRN: [id number] Date of Birth: May 13, 1940   Medicare Observation Status Notification Given:  Yes    Matthew Cina E Graham Doukas, LCSW 06/17/2021, 3:29 PM

## 2021-06-17 NOTE — Plan of Care (Signed)

## 2021-06-18 DIAGNOSIS — I10 Essential (primary) hypertension: Secondary | ICD-10-CM | POA: Diagnosis not present

## 2021-06-18 DIAGNOSIS — K56600 Partial intestinal obstruction, unspecified as to cause: Secondary | ICD-10-CM | POA: Diagnosis not present

## 2021-06-18 DIAGNOSIS — R8281 Pyuria: Secondary | ICD-10-CM | POA: Diagnosis not present

## 2021-06-18 DIAGNOSIS — K5651 Intestinal adhesions [bands], with partial obstruction: Secondary | ICD-10-CM | POA: Diagnosis not present

## 2021-06-18 DIAGNOSIS — R109 Unspecified abdominal pain: Secondary | ICD-10-CM | POA: Diagnosis not present

## 2021-06-18 LAB — BASIC METABOLIC PANEL
Anion gap: 8 (ref 5–15)
BUN: 16 mg/dL (ref 8–23)
CO2: 26 mmol/L (ref 22–32)
Calcium: 8.9 mg/dL (ref 8.9–10.3)
Chloride: 107 mmol/L (ref 98–111)
Creatinine, Ser: 0.82 mg/dL (ref 0.44–1.00)
GFR, Estimated: >60 mL/min (ref >60)
Glucose, Bld: 93 mg/dL (ref 70–99)
Potassium: 4.1 mmol/L (ref 3.5–5.1)
Sodium: 141 mmol/L (ref 135–145)

## 2021-06-18 NOTE — Progress Notes (Signed)
Blood pressure 119/61, pulse 77, temperature 98.1 F (36.7 C), resp. rate 16, height 5\' 2"  (1.575 m), weight 78 kg, SpO2 94 %. IV cath c/d/I discussed d/c paperwork with pt she verbalized understanding of information provided. Pt d/c via w/c down to private car.

## 2021-06-18 NOTE — Discharge Summary (Signed)
Physician Discharge Summary   Patient: [name] MRN: [id number] DOB: 1940-09-20  Admit date:     06/16/2021  Discharge date: 06/18/21  Discharge Physician: Edwin Dada   PCP: Marinda Elk, MD   Recommendations at discharge:  Follow up with PCP in 1 week      Discharge Diagnoses: Principal Problem:   Abdominal pain, likely gastroenteritis, possibly partial small bowel obstruction Active Problems:   HLD (hyperlipidemia)   Essential hypertension   Pyuria       Hospital Course: [name] is a 80 y.o. F with hx HTN, hyperlipidemia, NHL, as well as hx cholecystectomy and abdominal lymph node biopsy with recurrent SBO who presented with abdominal bloating and pain for 1 day.  Started to have colicky epigastric pain, bloating sensation, nausea, and several loose stools.  This felt like her previous small bowel obstructions, so she came to the ER.  In the ER, electrolytes and hemogram normal.  CT of the abdomen and pelvis showed possible partial small bowel obstruction.  She was started on fluids        Assessment and Plan: * Abdominal pain Patient was admitted initially treated as if this was a small bowel obstruction given imaging description by Radiology and the similarity to patient's previous episodes.  Given fluids, bowel rest.    However she had continued diarrhea, cramping, and then tolerated advancement of diet to solid food with no abdominal pain, bloating, or vomiting but only with borborygmi and loose stools.    She was subsequently given Imodium with improvement of her symptoms, continues to tolerate an oral diet, and was discharged home.  In retrospect I think this is probably an enteritis and that the findings on CT were from this, although probably a partial small bowel obstruction cannot be ruled out     Pyuria Asymptomatic, no treatment needed          Pain control - [facility] Controlled Substance Reporting System  database was reviewed.        Disposition: Home Diet recommendation: Soft diet   DISCHARGE MEDICATION: Allergies as of 06/18/2021       Reactions   Buprenorphine Hcl Nausea And Vomiting   Morphine And Related Nausea And Vomiting        Medication List     TAKE these medications    CALCIUM 1200 PO Take 1 tablet by mouth 1 day or 1 dose.   lisinopril 10 MG tablet Commonly known as: ZESTRIL Take 10 mg by mouth daily.   loratadine 10 MG tablet Commonly known as: CLARITIN Take by mouth.   omeprazole 20 MG capsule Commonly known as: PRILOSEC Take 20 mg by mouth daily.   rosuvastatin 5 MG tablet Commonly known as: CRESTOR Take by mouth.   Vitamin D3 Gummies 25 MCG (1000 UT) Chew Generic drug: Cholecalciferol Chew 1,000 Units by mouth daily.        Follow-up Information     Marinda Elk, MD. Schedule an appointment as soon as possible for a visit in 1 week(s).   Specialty: Physician Assistant Contact information: [address] [place] [address] [phone]                Discharge Instructions     Discharge instructions   Complete by: As directed    From Dr. Loleta Books: You were admitted for abdominal pain and diarrhea.   You had a CT scan that showed dilated bowels.  This may have been another partial  small bowel obstruction, but it may also have just been a stomach flu (we call this gastroenteritis or enteritis).   Ultimately, it acted more like gastroenteritis than bowel obstruction. Regardless, you have been tolerating food well without pain, and I feel it is safe to go home.  Follow up with your primary care doctor in 1 week  In the meantime, eat a soft diet only.  (Low fiber foods, avoid meats except ground meats, avoid vegetables and fruits unless they are pureed like applesauce)  You may take loperamide/Imodium for the diarrhea  Return for vomiting, severe pain that doesn't get better, or weakness   Increase activity  slowly   Complete by: As directed         Discharge Exam: Filed Weights   06/16/21 1401  Weight: 78 kg   General: Pt is alert, awake, not in acute distress Cardiovascular: RRR, nl S1-S2, no murmurs appreciated.   No LE edema.   Respiratory: Normal respiratory rate and rhythm.  CTAB without rales or wheezes. Abdominal: Abdomen soft and non-tender.  No distension or HSM.   Neuro/Psych: Strength symmetric in upper and lower extremities.  Judgment and insight appear normal.   Condition at discharge: good  The results of significant diagnostics from this hospitalization (including imaging, microbiology, ancillary and laboratory) are listed below for reference.   Imaging Studies: CT ABDOMEN PELVIS W CONTRAST  Result Date: 06/16/2021 CLINICAL DATA:  Upper abdominal pain and bloating. EXAM: CT ABDOMEN AND PELVIS WITH CONTRAST TECHNIQUE: Multidetector CT imaging of the abdomen and pelvis was performed using the standard protocol following bolus administration of intravenous contrast. RADIATION DOSE REDUCTION: This exam was performed according to the departmental dose-optimization program which includes automated exposure control, adjustment of the mA and/or kV according to patient size and/or use of iterative reconstruction technique. CONTRAST:  163mL OMNIPAQUE IOHEXOL 300 MG/ML  SOLN COMPARISON:  May 31, 2020. FINDINGS: Lower chest: No acute abnormality. Hepatobiliary: Status post cholecystectomy. No biliary dilatation is noted. Stable hepatic cysts are noted. Pancreas: Unremarkable. No pancreatic ductal dilatation or surrounding inflammatory changes. Spleen: Normal in size without focal abnormality. Adrenals/Urinary Tract: Adrenal glands are unremarkable. Kidneys are normal, without renal calculi, focal lesion, or hydronephrosis. Bladder is unremarkable. Stomach/Bowel: Status post appendectomy. The stomach is unremarkable. No colonic dilatation is noted. Moderate proximal small bowel dilatation  is noted without definite transition zone. Is uncertain if this represents ileus or possibly partial bowel obstruction. Vascular/Lymphatic: No significant vascular findings are present. No enlarged abdominal or pelvic lymph nodes. Reproductive: Status post hysterectomy. No adnexal masses. Other: No abdominal wall hernia or abnormality. No abdominopelvic ascites. Musculoskeletal: No acute or significant osseous findings. IMPRESSION: Moderate proximal small bowel dilatation is noted without definite transition zone. It is uncertain if this represents ileus or possibly partial small bowel obstruction. Status post cholecystectomy. Stable hepatic cysts. Electronically Signed   By: Marijo Conception M.D.   On: 06/16/2021 16:38    Microbiology: Results for orders placed or performed during the hospital encounter of 06/16/21  Urine Culture     Status: Abnormal (Preliminary result)   Collection Time: 06/16/21  2:08 PM   Specimen: Urine, Clean Catch  Result Value Ref Range Status   Specimen Description   Final    URINE, CLEAN CATCH Performed at [facility], [address], [place], [place] [address]    Special Requests   Final    NONE Performed at [facility], [address], [place], [place] [address]  Culture (A)  Final    >=100,000 COLONIES/mL KLEBSIELLA PNEUMONIAE SUSCEPTIBILITIES TO FOLLOW Performed at [facility], [place] [address], [place], [place] [address]    Report Status PENDING  Incomplete  Resp Panel by RT-PCR (Flu A&B, Covid) Nasopharyngeal Swab     Status: None   Collection Time: 06/16/21  5:09 PM   Specimen: Nasopharyngeal Swab; Nasopharyngeal(NP) swabs in vial transport medium  Result Value Ref Range Status   SARS Coronavirus 2 by RT PCR NEGATIVE NEGATIVE Final    Comment: (NOTE) SARS-CoV-2 target nucleic acids are NOT DETECTED.  The SARS-CoV-2 RNA is generally detectable in upper respiratory specimens during the acute phase of infection. The  lowest concentration of SARS-CoV-2 viral copies this assay can detect is 138 copies/mL. A negative result does not preclude SARS-Cov-2 infection and should not be used as the sole basis for treatment or other patient management decisions. A negative result may occur with  improper specimen collection/handling, submission of specimen other than nasopharyngeal swab, presence of viral mutation(s) within the areas targeted by this assay, and inadequate number of viral copies(<138 copies/mL). A negative result must be combined with clinical observations, patient history, and epidemiological information. The expected result is Negative.  Fact Sheet for Patients:  EntrepreneurPulse.com.au  Fact Sheet for Healthcare Providers:  IncredibleEmployment.be  This test is no t yet approved or cleared by the [place] FDA and  has been authorized for detection and/or diagnosis of SARS-CoV-2 by FDA under an Emergency Use Authorization (EUA). This EUA will remain  in effect (meaning this test can be used) for the duration of the COVID-19 declaration under Section 564(b)(1) of the Act, 21 U.S.C.section 360bbb-3(b)(1), unless the authorization is terminated  or revoked sooner.       Influenza A by PCR NEGATIVE NEGATIVE Final   Influenza B by PCR NEGATIVE NEGATIVE Final    Comment: (NOTE) The Xpert Xpress SARS-CoV-2/FLU/RSV plus assay is intended as an aid in the diagnosis of influenza from Nasopharyngeal swab specimens and should not be used as a sole basis for treatment. Nasal washings and aspirates are unacceptable for Xpert Xpress SARS-CoV-2/FLU/RSV testing.  Fact Sheet for Patients: EntrepreneurPulse.com.au  Fact Sheet for Healthcare Providers: IncredibleEmployment.be  This test is not yet approved or cleared by the [place] FDA and has been authorized for detection and/or diagnosis of SARS-CoV-2 by FDA under  an Emergency Use Authorization (EUA). This EUA will remain in effect (meaning this test can be used) for the duration of the COVID-19 declaration under Section 564(b)(1) of the Act, 21 U.S.C. section 360bbb-3(b)(1), unless the authorization is terminated or revoked.  Performed at [facility], [address] [place], [place] [address]     Labs: CBC: Recent Labs  Lab 06/16/21 1408  WBC 10.5  HGB 14.5  HCT 41.8  MCV 84.3  PLT 086   Basic Metabolic Panel: Recent Labs  Lab 06/16/21 1408 06/17/21 0436 06/18/21 0354  NA 135  --  141  K 4.1  --  4.1  CL 106  --  107  CO2 20*  --  26  GLUCOSE 99  --  93  BUN 18  --  16  CREATININE 0.75  --  0.82  CALCIUM 9.5  --  8.9  MG  --  1.9  --    Liver Function Tests: Recent Labs  Lab 06/16/21 1408  AST 21  ALT 20  ALKPHOS 50  BILITOT 0.6  PROT 7.5  ALBUMIN 4.3   CBG: Recent  Labs  Lab 06/16/21 2259  GLUCAP 115*    Discharge time spent: 40 minutes.  Signed: Edwin Dada, MD [facility] 06/18/2021

## 2021-06-19 LAB — URINE CULTURE: Culture: >=100000 — AB

## 2021-06-21 ENCOUNTER — Other Ambulatory Visit: Payer: Self-pay

## 2021-06-21 DIAGNOSIS — T402X5A Adverse effect of other opioids, initial encounter: Secondary | ICD-10-CM | POA: Diagnosis present

## 2021-06-21 DIAGNOSIS — R112 Nausea with vomiting, unspecified: Secondary | ICD-10-CM | POA: Diagnosis present

## 2021-06-21 DIAGNOSIS — K56609 Unspecified intestinal obstruction, unspecified as to partial versus complete obstruction: Secondary | ICD-10-CM | POA: Diagnosis not present

## 2021-06-21 DIAGNOSIS — Z8249 Family history of ischemic heart disease and other diseases of the circulatory system: Secondary | ICD-10-CM

## 2021-06-21 DIAGNOSIS — I952 Hypotension due to drugs: Secondary | ICD-10-CM | POA: Diagnosis present

## 2021-06-21 DIAGNOSIS — K219 Gastro-esophageal reflux disease without esophagitis: Secondary | ICD-10-CM | POA: Diagnosis present

## 2021-06-21 DIAGNOSIS — J309 Allergic rhinitis, unspecified: Secondary | ICD-10-CM | POA: Diagnosis present

## 2021-06-21 DIAGNOSIS — Z79899 Other long term (current) drug therapy: Secondary | ICD-10-CM

## 2021-06-21 DIAGNOSIS — Z888 Allergy status to other drugs, medicaments and biological substances status: Secondary | ICD-10-CM

## 2021-06-21 DIAGNOSIS — Z85820 Personal history of malignant melanoma of skin: Secondary | ICD-10-CM

## 2021-06-21 DIAGNOSIS — Z808 Family history of malignant neoplasm of other organs or systems: Secondary | ICD-10-CM

## 2021-06-21 DIAGNOSIS — K56600 Partial intestinal obstruction, unspecified as to cause: Principal | ICD-10-CM | POA: Diagnosis present

## 2021-06-21 DIAGNOSIS — Z90721 Acquired absence of ovaries, unilateral: Secondary | ICD-10-CM

## 2021-06-21 DIAGNOSIS — M1611 Unilateral primary osteoarthritis, right hip: Secondary | ICD-10-CM | POA: Diagnosis present

## 2021-06-21 DIAGNOSIS — Z85828 Personal history of other malignant neoplasm of skin: Secondary | ICD-10-CM

## 2021-06-21 DIAGNOSIS — Z8572 Personal history of non-Hodgkin lymphomas: Secondary | ICD-10-CM

## 2021-06-21 DIAGNOSIS — E785 Hyperlipidemia, unspecified: Secondary | ICD-10-CM | POA: Diagnosis present

## 2021-06-21 DIAGNOSIS — I10 Essential (primary) hypertension: Secondary | ICD-10-CM | POA: Diagnosis present

## 2021-06-21 DIAGNOSIS — Z90710 Acquired absence of both cervix and uterus: Secondary | ICD-10-CM

## 2021-06-21 DIAGNOSIS — Z20822 Contact with and (suspected) exposure to covid-19: Secondary | ICD-10-CM | POA: Diagnosis present

## 2021-06-21 DIAGNOSIS — Z885 Allergy status to narcotic agent status: Secondary | ICD-10-CM

## 2021-06-21 LAB — CBC
HCT: 44.6 % (ref 36.0–46.0)
Hemoglobin: 15.2 g/dL — ABNORMAL HIGH (ref 12.0–15.0)
MCH: 29 pg (ref 26.0–34.0)
MCHC: 34.1 g/dL (ref 30.0–36.0)
MCV: 85.1 fL (ref 80.0–100.0)
Platelets: 321 10*3/uL (ref 150–400)
RBC: 5.24 MIL/uL — ABNORMAL HIGH (ref 3.87–5.11)
RDW: 12.8 % (ref 11.5–15.5)
WBC: 15.5 10*3/uL — ABNORMAL HIGH (ref 4.0–10.5)
nRBC: 0 % (ref 0.0–0.2)

## 2021-06-21 LAB — COMPREHENSIVE METABOLIC PANEL
ALT: 22 U/L (ref 0–44)
AST: 28 U/L (ref 15–41)
Albumin: 4.6 g/dL (ref 3.5–5.0)
Alkaline Phosphatase: 57 U/L (ref 38–126)
Anion gap: 10 (ref 5–15)
BUN: 20 mg/dL (ref 8–23)
CO2: 24 mmol/L (ref 22–32)
Calcium: 10.3 mg/dL (ref 8.9–10.3)
Chloride: 102 mmol/L (ref 98–111)
Creatinine, Ser: 0.93 mg/dL (ref 0.44–1.00)
GFR, Estimated: >60 mL/min (ref >60)
Glucose, Bld: 127 mg/dL — ABNORMAL HIGH (ref 70–99)
Potassium: 3.5 mmol/L (ref 3.5–5.1)
Sodium: 136 mmol/L (ref 135–145)
Total Bilirubin: 0.6 mg/dL (ref 0.3–1.2)
Total Protein: 7.9 g/dL (ref 6.5–8.1)

## 2021-06-21 LAB — LIPASE, BLOOD: Lipase: 33 U/L (ref 11–51)

## 2021-06-21 MED ORDER — ONDANSETRON 4 MG PO TBDP
4.0000 mg | ORAL_TABLET | Freq: Once | ORAL | Status: AC | PRN
  Administered 2021-06-21: 4 mg via ORAL
  Filled 2021-06-21: qty 1

## 2021-06-21 NOTE — ED Triage Notes (Signed)
First nurse note: Pt to ED via EMS from home, c/o 1.5hr PTA upper abd pain.  Seen Saturday with gastroenteritis, hx bowel obstruction, states nausea no vomiting, EMS vitals 134/74 BP, 80 HR, 97% RA, 170 CBG, 98 temp axillary.

## 2021-06-21 NOTE — ED Triage Notes (Signed)
Pt presents to ER c/o abd pain, n/v for appx 2 hrs.  Pt states she was admitted here on 2/9 and released on Saturday for gastroenteritis. Pt states she is in more pain at this time than she was last time.  Pt states she has had previous hx of bowel obstructions.  Pt states last BM was today.  Pt A&O x4 at this time.  Pt appears uncomfortable in triage.

## 2021-06-22 ENCOUNTER — Emergency Department: Payer: Medicare Other

## 2021-06-22 ENCOUNTER — Inpatient Hospital Stay
Admission: EM | Admit: 2021-06-22 | Discharge: 2021-06-24 | DRG: 390 | Disposition: A | Discharge status: Home / Self Care | Payer: Medicare Other | Attending: Student in an Organized Health Care Education/Training Program | Admitting: Student in an Organized Health Care Education/Training Program

## 2021-06-22 ENCOUNTER — Encounter: Payer: Self-pay | Admitting: Radiology

## 2021-06-22 ENCOUNTER — Inpatient Hospital Stay: Payer: Medicare Other

## 2021-06-22 DIAGNOSIS — Z79899 Other long term (current) drug therapy: Secondary | ICD-10-CM | POA: Diagnosis not present

## 2021-06-22 DIAGNOSIS — Z90721 Acquired absence of ovaries, unilateral: Secondary | ICD-10-CM | POA: Diagnosis not present

## 2021-06-22 DIAGNOSIS — Z85820 Personal history of malignant melanoma of skin: Secondary | ICD-10-CM | POA: Diagnosis not present

## 2021-06-22 DIAGNOSIS — K56600 Partial intestinal obstruction, unspecified as to cause: Secondary | ICD-10-CM | POA: Diagnosis present

## 2021-06-22 DIAGNOSIS — K219 Gastro-esophageal reflux disease without esophagitis: Secondary | ICD-10-CM

## 2021-06-22 DIAGNOSIS — M1611 Unilateral primary osteoarthritis, right hip: Secondary | ICD-10-CM | POA: Diagnosis present

## 2021-06-22 DIAGNOSIS — I10 Essential (primary) hypertension: Secondary | ICD-10-CM | POA: Diagnosis present

## 2021-06-22 DIAGNOSIS — I952 Hypotension due to drugs: Secondary | ICD-10-CM | POA: Diagnosis present

## 2021-06-22 DIAGNOSIS — R112 Nausea with vomiting, unspecified: Secondary | ICD-10-CM

## 2021-06-22 DIAGNOSIS — Z808 Family history of malignant neoplasm of other organs or systems: Secondary | ICD-10-CM | POA: Diagnosis not present

## 2021-06-22 DIAGNOSIS — E785 Hyperlipidemia, unspecified: Secondary | ICD-10-CM | POA: Diagnosis present

## 2021-06-22 DIAGNOSIS — K56609 Unspecified intestinal obstruction, unspecified as to partial versus complete obstruction: Secondary | ICD-10-CM

## 2021-06-22 DIAGNOSIS — Z885 Allergy status to narcotic agent status: Secondary | ICD-10-CM | POA: Diagnosis not present

## 2021-06-22 DIAGNOSIS — Z8249 Family history of ischemic heart disease and other diseases of the circulatory system: Secondary | ICD-10-CM | POA: Diagnosis not present

## 2021-06-22 DIAGNOSIS — Z20822 Contact with and (suspected) exposure to covid-19: Secondary | ICD-10-CM | POA: Diagnosis present

## 2021-06-22 DIAGNOSIS — Z90710 Acquired absence of both cervix and uterus: Secondary | ICD-10-CM | POA: Diagnosis not present

## 2021-06-22 DIAGNOSIS — Z85828 Personal history of other malignant neoplasm of skin: Secondary | ICD-10-CM | POA: Diagnosis not present

## 2021-06-22 DIAGNOSIS — J309 Allergic rhinitis, unspecified: Secondary | ICD-10-CM | POA: Diagnosis present

## 2021-06-22 DIAGNOSIS — Z888 Allergy status to other drugs, medicaments and biological substances status: Secondary | ICD-10-CM | POA: Diagnosis not present

## 2021-06-22 DIAGNOSIS — R1010 Upper abdominal pain, unspecified: Secondary | ICD-10-CM

## 2021-06-22 DIAGNOSIS — T402X5A Adverse effect of other opioids, initial encounter: Secondary | ICD-10-CM | POA: Diagnosis present

## 2021-06-22 DIAGNOSIS — Z8572 Personal history of non-Hodgkin lymphomas: Secondary | ICD-10-CM | POA: Diagnosis not present

## 2021-06-22 LAB — BASIC METABOLIC PANEL
Anion gap: 8 (ref 5–15)
BUN: 22 mg/dL (ref 8–23)
CO2: 23 mmol/L (ref 22–32)
Calcium: 8.9 mg/dL (ref 8.9–10.3)
Chloride: 105 mmol/L (ref 98–111)
Creatinine, Ser: 0.87 mg/dL (ref 0.44–1.00)
GFR, Estimated: >60 mL/min (ref >60)
Glucose, Bld: 102 mg/dL — ABNORMAL HIGH (ref 70–99)
Potassium: 4.2 mmol/L (ref 3.5–5.1)
Sodium: 136 mmol/L (ref 135–145)

## 2021-06-22 LAB — URINALYSIS, ROUTINE W REFLEX MICROSCOPIC
Bilirubin Urine: NEGATIVE
Glucose, UA: NEGATIVE mg/dL
Hgb urine dipstick: NEGATIVE
Ketones, ur: NEGATIVE mg/dL
Nitrite: POSITIVE — AB
Protein, ur: NEGATIVE mg/dL
Specific Gravity, Urine: >1.046 — ABNORMAL HIGH (ref 1.005–1.030)
Squamous Epithelial / HPF: NONE SEEN (ref 0–5)
pH: 5 (ref 5.0–8.0)

## 2021-06-22 LAB — CBC
HCT: 36.8 % (ref 36.0–46.0)
Hemoglobin: 12.6 g/dL (ref 12.0–15.0)
MCH: 29.4 pg (ref 26.0–34.0)
MCHC: 34.2 g/dL (ref 30.0–36.0)
MCV: 85.8 fL (ref 80.0–100.0)
Platelets: 258 10*3/uL (ref 150–400)
RBC: 4.29 MIL/uL (ref 3.87–5.11)
RDW: 12.8 % (ref 11.5–15.5)
WBC: 6.9 10*3/uL (ref 4.0–10.5)
nRBC: 0 % (ref 0.0–0.2)

## 2021-06-22 LAB — RESP PANEL BY RT-PCR (FLU A&B, COVID) ARPGX2
Influenza A by PCR: NEGATIVE
Influenza B by PCR: NEGATIVE
SARS Coronavirus 2 by RT PCR: NEGATIVE

## 2021-06-22 LAB — TROPONIN I (HIGH SENSITIVITY): Troponin I (High Sensitivity): 3 ng/L (ref <18)

## 2021-06-22 MED ORDER — LORATADINE 10 MG PO TABS
10.0000 mg | ORAL_TABLET | Freq: Every day | ORAL | Status: DC
  Administered 2021-06-23 – 2021-06-24 (×2): 10 mg via ORAL
  Filled 2021-06-22 (×4): qty 1

## 2021-06-22 MED ORDER — MAGNESIUM HYDROXIDE 400 MG/5ML PO SUSP: 30.0000 mL | Freq: Every day | ORAL | Status: DC | PRN

## 2021-06-22 MED ORDER — FENTANYL CITRATE PF 50 MCG/ML IJ SOSY
50.0000 ug | PREFILLED_SYRINGE | Freq: Once | INTRAMUSCULAR | Status: AC
  Administered 2021-06-22: 50 ug via INTRAVENOUS
  Filled 2021-06-22: qty 1

## 2021-06-22 MED ORDER — ROSUVASTATIN CALCIUM 10 MG PO TABS
5.0000 mg | ORAL_TABLET | Freq: Every day | ORAL | Status: DC
  Administered 2021-06-22 – 2021-06-24 (×3): 5 mg via ORAL
  Filled 2021-06-22 (×3): qty 1

## 2021-06-22 MED ORDER — ONDANSETRON HCL 4 MG/2ML IJ SOLN
4.0000 mg | Freq: Once | INTRAMUSCULAR | Status: AC
  Administered 2021-06-22: 4 mg via INTRAVENOUS
  Filled 2021-06-22: qty 2

## 2021-06-22 MED ORDER — ENOXAPARIN SODIUM 40 MG/0.4ML IJ SOSY
0.5000 mg/kg | PREFILLED_SYRINGE | INTRAMUSCULAR | Status: DC
  Administered 2021-06-22 – 2021-06-23 (×2): 40 mg via SUBCUTANEOUS
  Filled 2021-06-22 (×3): qty 0.4

## 2021-06-22 MED ORDER — OYSTER SHELL CALCIUM/D3 500-5 MG-MCG PO TABS
2.0000 | ORAL_TABLET | Freq: Every day | ORAL | Status: AC
  Filled 2021-06-22: qty 2

## 2021-06-22 MED ORDER — ONDANSETRON HCL 4 MG/2ML IJ SOLN: 4.0000 mg | Freq: Four times a day (QID) | INTRAMUSCULAR | Status: DC | PRN

## 2021-06-22 MED ORDER — VITAMIN D 25 MCG (1000 UNIT) PO TABS
1000.0000 [IU] | ORAL_TABLET | Freq: Every day | ORAL | Status: DC
  Filled 2021-06-22 (×3): qty 1

## 2021-06-22 MED ORDER — LISINOPRIL 10 MG PO TABS
10.0000 mg | ORAL_TABLET | Freq: Every day | ORAL | Status: DC
Start: 2021-06-22 — End: 2021-06-24
  Administered 2021-06-23 – 2021-06-24 (×2): 10 mg via ORAL
  Filled 2021-06-22 (×3): qty 1

## 2021-06-22 MED ORDER — POTASSIUM CHLORIDE IN NACL 20-0.9 MEQ/L-% IV SOLN
INTRAVENOUS | Status: DC
  Filled 2021-06-22 (×7): qty 1000

## 2021-06-22 MED ORDER — ACETAMINOPHEN 650 MG RE SUPP: 650.0000 mg | Freq: Four times a day (QID) | RECTAL | Status: DC | PRN

## 2021-06-22 MED ORDER — TRAZODONE HCL 50 MG PO TABS: 25.0000 mg | ORAL_TABLET | Freq: Every evening | ORAL | Status: DC | PRN

## 2021-06-22 MED ORDER — PANTOPRAZOLE SODIUM 40 MG IV SOLR
40.0000 mg | INTRAVENOUS | Status: DC
  Administered 2021-06-22 – 2021-06-23 (×2): 40 mg via INTRAVENOUS
  Filled 2021-06-22 (×2): qty 10

## 2021-06-22 MED ORDER — MORPHINE SULFATE (PF) 2 MG/ML IV SOLN
2.0000 mg | INTRAVENOUS | Status: DC | PRN
Start: 2021-06-22 — End: 2021-06-22

## 2021-06-22 MED ORDER — FENTANYL CITRATE PF 50 MCG/ML IJ SOSY: 12.5000 ug | PREFILLED_SYRINGE | INTRAMUSCULAR | Status: DC | PRN

## 2021-06-22 MED ORDER — ACETAMINOPHEN 325 MG PO TABS
650.0000 mg | ORAL_TABLET | Freq: Four times a day (QID) | ORAL | Status: DC | PRN
  Filled 2021-06-22: qty 2

## 2021-06-22 MED ORDER — HYDROMORPHONE HCL 1 MG/ML IJ SOLN: 0.5000 mg | Freq: Two times a day (BID) | INTRAMUSCULAR | Status: DC | PRN

## 2021-06-22 MED ORDER — ONDANSETRON HCL 4 MG PO TABS: 4.0000 mg | ORAL_TABLET | Freq: Four times a day (QID) | ORAL | Status: DC | PRN

## 2021-06-22 MED ORDER — IOHEXOL 300 MG/ML  SOLN
100.0000 mL | Freq: Once | INTRAMUSCULAR | Status: AC | PRN
  Administered 2021-06-22: 100 mL via INTRAVENOUS

## 2021-06-22 MED ORDER — PANTOPRAZOLE SODIUM 40 MG PO TBEC
40.0000 mg | DELAYED_RELEASE_TABLET | Freq: Every day | ORAL | Status: DC
  Administered 2021-06-22: 40 mg via ORAL
  Filled 2021-06-22: qty 1

## 2021-06-22 MED ORDER — SODIUM CHLORIDE 0.9 % IV BOLUS
1000.0000 mL | Freq: Once | INTRAVENOUS | Status: AC
  Administered 2021-06-22: 1000 mL via INTRAVENOUS

## 2021-06-22 NOTE — Consult Note (Signed)
[facility] SURGICAL CONSULTATION NOTE (initial) - cpt: 50093   HISTORY OF PRESENT ILLNESS (HPI):  81 y.o. female presented to [facility] ED overnight for evaluation of abdominal pain. Patient reports around a 12-24 hour history of abdominal distension, nausea, emesis, and diarrhea which started after attempting to eat dinner last night. No fever, chills, CP, SOB, or urinary changes. She is known to our service for similar partial SBO in January of last year. Also appears to have had a more recent admission from 02/09 - 02/11 which was again managed conservatively. She did report to EDP, her presentation last night felt worse than her most recent admission. She does have a significant surgical history including abdominal hysterectomy, cholecystectomy, appendectomy, and ventral hernia repair x2. Work up in the ED revealed a leukocytosis to 15.5K (now normal at 6.9K) and Hgb to 15.2 (now normal at 12.6) likely indicating hemoconcentration; however, her remaining laboratory work was reassuring. She did undergo CT Abdomen/Pelvis showing dilated loops of small bowel which appear distally decompressed without obvious transition. NGT was deferred.   Surgery is consulted by hospitalist physician Dr. Eugenie Norrie, MD in this context for evaluation and management of partial SBO.  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   Non Hodgkin's lymphoma ([place])    Non Hodgkin's lymphoma ([place])    RAD (reactive airway disease)    Skin  cancer      PAST SURGICAL HISTORY ([place]):  Past Surgical History:  Procedure Laterality Date   ABDOMINAL HYSTERECTOMY     APPENDECTOMY     BASAL CELL CARCINOMA EXCISION     on both sides of nose   BREAST BIOPSY Left 12/2013   hyperplasia   CHOLECYSTECTOMY     COLONOSCOPY WITH PROPOFOL N/A 06/05/2017   Procedure: COLONOSCOPY WITH PROPOFOL;  Surgeon: Lollie Sails, MD;  Location: [facility] ENDOSCOPY;  Service: Endoscopy;  Laterality: N/A;   COLONOSCOPY WITH PROPOFOL N/A 05/14/2020   Procedure: COLONOSCOPY WITH PROPOFOL;  Surgeon: Lesly Rubenstein, MD;  Location: [facility] ENDOSCOPY;  Service: Endoscopy;  Laterality: N/A;   ESOPHAGOGASTRODUODENOSCOPY (EGD) WITH PROPOFOL N/A 03/30/2015   Procedure: ESOPHAGOGASTRODUODENOSCOPY (EGD) WITH PROPOFOL;  Surgeon: Lollie Sails, MD;  Location: [facility] ENDOSCOPY;  Service: Endoscopy;  Laterality: N/A;   ESOPHAGOGASTRODUODENOSCOPY (EGD) WITH PROPOFOL N/A 06/05/2017   Procedure: ESOPHAGOGASTRODUODENOSCOPY (EGD) WITH PROPOFOL;  Surgeon: Lollie Sails, MD;  Location: [facility] ENDOSCOPY;  Service: Endoscopy;  Laterality: N/A;   ESOPHAGOGASTRODUODENOSCOPY (EGD) WITH PROPOFOL N/A 05/14/2020   Procedure: ESOPHAGOGASTRODUODENOSCOPY (EGD) WITH PROPOFOL;  Surgeon: Lesly Rubenstein, MD;  Location: [facility] ENDOSCOPY;  Service: Endoscopy;  Laterality: N/A;   FACIAL COSMETIC SURGERY     history of multiple colonic polyps     lymph node removal from left groin     OOPHORECTOMY     VENTRAL HERNIA REPAIR N/A 10/02/2014   Procedure: HERNIA REPAIR VENTRAL ADULT;  Surgeon: Leonie Green, MD;  Location: [facility] ORS;  Service: General;  Laterality: N/A;  with mesh     MEDICATIONS:  Prior to Admission medications   Medication Sig Start Date End Date Taking? Authorizing Provider  Calcium Carbonate-Vit D-Min (CALCIUM 1200 PO) Take 1 tablet by mouth 1 day or 1 dose.   Yes [provider]  Cholecalciferol (VITAMIN D3 GUMMIES) 25 MCG (1000 UT) CHEW Chew 1,000 Units by  mouth daily.   Yes [provider]  lisinopril (PRINIVIL,ZESTRIL) 10 MG tablet Take 10 mg by mouth daily. 09/21/16  Yes [provider]  loratadine (CLARITIN) 10 MG tablet Take 10 mg by mouth daily.   Yes [provider]  omeprazole (PRILOSEC) 20 MG capsule Take 20 mg by mouth daily. 06/08/15  Yes [provider]  rosuvastatin (CRESTOR) 5 MG tablet Take 5 mg by mouth daily. 08/12/20 08/12/21 Yes [provider]     ALLERGIES:  Allergies  Allergen Reactions   Buprenorphine Hcl Nausea And Vomiting   Dilaudid [Hydromorphone Hcl] Anxiety and Other (See Comments)    Hypotension    Morphine And Related Nausea And Vomiting     SOCIAL HISTORY:  Social History   Socioeconomic History   Marital status: Married    Spouse name: Not on file   Number of children: Not on file   Years of education: Not on file   Highest education level: Not on file  Occupational History   Not on file  Tobacco Use   Smoking status: Never   Smokeless tobacco: Never  Vaping Use   Vaping Use: Never used  Substance and Sexual Activity   Alcohol use: Not Currently    Alcohol/week: 1.0 standard drink    Types: 1 Cans of beer per week    Comment: rare social occasions only   Drug use: Never   Sexual activity: Never  Other Topics Concern   Not on file  Social History Narrative   Not on file   Social Determinants of Health   Financial Resource Strain: Not on file  Food Insecurity: Not on file  Transportation Needs: Not on file  Physical Activity: Not on file  Stress: Not on file  Social Connections: Not on file  Intimate Partner Violence: Not on file     FAMILY HISTORY:  Family History  Problem Relation Age of Onset   Alcohol abuse Father    Stroke Father    Diabetes Sister    Kidney cancer Sister    Melanoma Brother    Diabetes Brother    Heart attack Brother    Breast cancer Neg Hx    Bladder Cancer Neg Hx    Prostate cancer Neg Hx       REVIEW OF  SYSTEMS:  Review of Systems  Constitutional:  Negative for chills and fever.  HENT:  Negative for congestion and sore throat.   Respiratory:  Negative for cough and shortness of breath.   Cardiovascular:  Negative for chest pain and palpitations.  Gastrointestinal:  Positive for abdominal pain, nausea and vomiting. Negative for constipation and diarrhea.  Genitourinary:  Negative for dysuria and urgency.  All other systems reviewed and are negative.  VITAL SIGNS:  Temp:  [97.5 F (36.4 C)] 97.5 F (36.4 C) (02/14 2136) Pulse Rate:  [79-97] 79 (02/15 0630) Resp:  [15-24] 17 (02/15 0630) BP: (85-139)/(52-82) 101/56 (02/15 0630) SpO2:  [91 %-100 %] 98 % (02/15 0630) Weight:  [78 kg] 78 kg (02/14 2137)     Height: 5\' 2"  (157.5  cm) Weight: 78 kg BMI (Calculated): 31.45   INTAKE/OUTPUT:  02/14 0701 - 02/15 0700 In: 582.8 [IV Piggyback:582.8] Out: -   PHYSICAL EXAM:  Physical Exam Vitals and nursing note reviewed. Exam conducted with a chaperone present.  Constitutional:      General: She is not in acute distress.    Appearance: She is well-developed. She is obese. She is not ill-appearing.  HENT:     Head: Normocephalic and atraumatic.  Eyes:     General: No scleral icterus.    Extraocular Movements: Extraocular movements intact.  Cardiovascular:     Rate and Rhythm: Normal rate and regular rhythm.     Heart sounds: Normal heart sounds. No murmur heard. Pulmonary:     Effort: Pulmonary effort is normal. No respiratory distress.  Abdominal:     General: A surgical scar is present. There is no distension.     Palpations: Abdomen is soft.     Tenderness: There is abdominal tenderness (Minimal) in the epigastric area. There is no guarding or rebound.     Comments: Abdomen is soft, minimal soreness in the epigastrium, non-distended, no rebound/guarding. Multiple surgical scars present  Genitourinary:    Comments: Deferred Skin:    General: Skin is warm and dry.     Coloration:  Skin is not jaundiced or pale.  Neurological:     General: No focal deficit present.     Mental Status: She is alert and oriented to person, place, and time.  Psychiatric:        Mood and Affect: Mood normal.        Behavior: Behavior normal.     Labs:  CBC Latest Ref Rng & Units 06/22/2021 06/21/2021 06/16/2021  WBC 4.0 - 10.5 K/uL 6.9 15.5(H) 10.5  Hemoglobin 12.0 - 15.0 g/dL 12.6 15.2(H) 14.5  Hematocrit 36.0 - 46.0 % 36.8 44.6 41.8  Platelets 150 - 400 K/uL 258 321 281   CMP Latest Ref Rng & Units 06/22/2021 06/21/2021 06/18/2021  Glucose 70 - 99 mg/dL 102(H) 127(H) 93  BUN 8 - 23 mg/dL 22 20 16   Creatinine 0.44 - 1.00 mg/dL 0.87 0.93 0.82  Sodium 135 - 145 mmol/L 136 136 141  Potassium 3.5 - 5.1 mmol/L 4.2 3.5 4.1  Chloride 98 - 111 mmol/L 105 102 107  CO2 22 - 32 mmol/L 23 24 26   Calcium 8.9 - 10.3 mg/dL 8.9 10.3 8.9  Total Protein 6.5 - 8.1 g/dL - 7.9 -  Total Bilirubin 0.3 - 1.2 mg/dL - 0.6 -  Alkaline Phos 38 - 126 U/L - 57 -  AST 15 - 41 U/L - 28 -  ALT 0 - 44 U/L - 22 -     Imaging studies:   CT Abdomen/Pelvis (06/22/2021) personally reviewed which did show loops of dilated small bowel which appear distally decompresses, stomach is not overlying distended, no clear transition zone, no other intra-abdominal findings, and radiologist report reviewed below:  IMPRESSION: Mildly prominent proximal and mid small bowel loops. Distal small bowel loops are decompressed. Findings concerning for partial small bowel obstruction. Exact transition point not visualized.   Assessment/Plan: (ICD-10's: K42.609) 81 y.o. female with clinically improving, likely partial small bowel obstruction secondary to significant surgical history   - Given her improvement since admission and current lack of abdominal tenderness, nausea, and emesis, we can hold off on NGT placement. She understands that if these develop we would recommend placement of this.    - No emergent surgical intervention.  She, and  her son, understand our goal would be to avoid this in the acute setting if possible.    - Recommend NPO for now; would take diet advancement slow given recent admission for similar.   - IVF resuscitation - Monitor abdominal examination; on-going bowel function   - Can consider serial KUB as needed  - Pain control prn (minimize narcotics as feasible); antiemetics prn  - Mobilization as tolerated  - Further management per primary service; we will follow along  All of the above findings and recommendations were discussed with the patient and her family (son at bedside), and all of patient's and her family's questions were answered to their expressed satisfaction.  Thank you for the opportunity to participate in this patient's care.   -- Edison Simon, PA-C [facility] 06/22/2021, 7:50 AM [phone] M-F: 7am - 4pm

## 2021-06-22 NOTE — H&P (Signed)
[place]   PATIENT NAME: [name]    MR#:  [id number]  DATE OF BIRTH:  [birth year]  DATE OF ADMISSION:  06/22/2021  PRIMARY CARE PHYSICIAN: Marinda Elk, MD   Patient is coming from: Home  REQUESTING/REFERRING PHYSICIAN: Lurline Hare, MD  CHIEF COMPLAINT:   Chief Complaint  Patient presents with   Abdominal Pain    HISTORY OF PRESENT ILLNESS:  [name] is a 81 y.o. female with medical history significant for allergic rhinitis, GERD, hypertension, dyslipidemia, migraine, non-Hodgkin's lymphoma and osteoarthritis, who presented to the emergency room with acute onset of generalized abdominal pain as well as nausea and vomiting twice since yesterday and diarrhea.  She admits to abdominal pain extending from epigastrium up to the back.  Her last good bowel movement was at 9 PM.  No fever or chills.  No chest pain or dyspnea or cough or wheezing.  No dysuria, oliguria or hematuria or flank pain.  ED Course: Upon presentation to the emergency room, temperature was 97.5 and respiratory rate was 24 with otherwise normal vital signs.  Labs revealed a borderline potassium of 3.5 with otherwise unremarkable CMP.  CBC showed leukocytosis of 15.5 and hemoconcentration.  UA came back with many bacteria and 1 1046 specific gravity with 6-10 WBCs. EKG as reviewed by me : EKG shows sinus tachycardia with a rate of 103. Imaging: Pelvic CT scan revealed mildly prominent proximal and mid small bowel loops and distal small bowel loops are decompressed, findings concerning for partial small bowel obstruction.  The patient was given 4 mg IV Zofran twice, 50 mcg of IV fentanyl and 1 L bolus of IV normal saline.  She will be admitted to an medical-surgical bed for further evaluation and management. 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   Non Hodgkin's lymphoma ([place])    Non Hodgkin's lymphoma ([place])    RAD (reactive airway disease)    Skin cancer     PAST SURGICAL HISTORY:   Past Surgical History:  Procedure Laterality Date   ABDOMINAL HYSTERECTOMY     APPENDECTOMY     BASAL CELL CARCINOMA EXCISION     on both sides of nose   BREAST BIOPSY Left 12/2013   hyperplasia   CHOLECYSTECTOMY     COLONOSCOPY WITH PROPOFOL N/A 06/05/2017   Procedure: COLONOSCOPY WITH PROPOFOL;  Surgeon: Lollie Sails, MD;  Location: [facility] ENDOSCOPY;  Service: Endoscopy;  Laterality: N/A;   COLONOSCOPY WITH PROPOFOL N/A 05/14/2020   Procedure: COLONOSCOPY WITH PROPOFOL;  Surgeon: Lesly Rubenstein, MD;  Location: [facility] ENDOSCOPY;  Service: Endoscopy;  Laterality: N/A;   ESOPHAGOGASTRODUODENOSCOPY (EGD) WITH PROPOFOL N/A 03/30/2015   Procedure: ESOPHAGOGASTRODUODENOSCOPY (EGD) WITH PROPOFOL;  Surgeon: Lollie Sails, MD;  Location: [facility] ENDOSCOPY;  Service: Endoscopy;  Laterality: N/A;   ESOPHAGOGASTRODUODENOSCOPY (EGD) WITH PROPOFOL N/A 06/05/2017   Procedure: ESOPHAGOGASTRODUODENOSCOPY (EGD) WITH  PROPOFOL;  Surgeon: Lollie Sails, MD;  Location: [facility] ENDOSCOPY;  Service: Endoscopy;  Laterality: N/A;   ESOPHAGOGASTRODUODENOSCOPY (EGD) WITH PROPOFOL N/A 05/14/2020   Procedure: ESOPHAGOGASTRODUODENOSCOPY (EGD) WITH PROPOFOL;  Surgeon: Lesly Rubenstein, MD;  Location: [facility] ENDOSCOPY;  Service: Endoscopy;  Laterality: N/A;   FACIAL COSMETIC SURGERY     history of multiple colonic polyps     lymph node removal from left groin     OOPHORECTOMY     VENTRAL HERNIA REPAIR N/A 10/02/2014   Procedure: HERNIA REPAIR  VENTRAL ADULT;  Surgeon: Leonie Green, MD;  Location: [facility] ORS;  Service: General;  Laterality: N/A;  with mesh    SOCIAL HISTORY:   Social History   Tobacco Use   Smoking status: Never   Smokeless tobacco: Never  Substance Use Topics   Alcohol use: Not Currently    Alcohol/week: 1.0 standard drink    Types: 1 Cans of beer per week    Comment: rare social occasions only    FAMILY HISTORY:   Family History  Problem Relation Age of Onset   Alcohol abuse Father    Stroke Father    Diabetes Sister    Kidney cancer Sister    Melanoma Brother    Diabetes Brother    Heart attack Brother    Breast cancer Neg Hx    Bladder Cancer Neg Hx    Prostate cancer Neg Hx     DRUG ALLERGIES:   Allergies  Allergen Reactions   Buprenorphine Hcl Nausea And Vomiting   Dilaudid [Hydromorphone Hcl] Anxiety and Other (See Comments)    Hypotension    Morphine And Related Nausea And Vomiting    REVIEW OF SYSTEMS:   ROS As per history of present illness. All pertinent systems were reviewed above. Constitutional, HEENT, cardiovascular, respiratory, GI, GU, musculoskeletal, neuro, psychiatric, endocrine, integumentary and hematologic systems were reviewed and are otherwise negative/unremarkable except for positive findings mentioned above in the HPI.   MEDICATIONS AT HOME:   Prior to Admission medications   Medication Sig Start Date End Date Taking? Authorizing Provider  Calcium Carbonate-Vit D-Min (CALCIUM 1200 PO) Take 1 tablet by mouth 1 day or 1 dose.   Yes [provider]  Cholecalciferol (VITAMIN D3 GUMMIES) 25 MCG (1000 UT) CHEW Chew 1,000 Units by mouth daily.   Yes [provider]  lisinopril (PRINIVIL,ZESTRIL) 10 MG tablet Take 10 mg by mouth daily. 09/21/16  Yes [provider]  loratadine (CLARITIN) 10 MG tablet Take 10 mg by mouth daily.   Yes [provider]  omeprazole (PRILOSEC) 20 MG capsule Take 20 mg by mouth daily. 06/08/15  Yes  [provider]  rosuvastatin (CRESTOR) 5 MG tablet Take 5 mg by mouth daily. 08/12/20 08/12/21 Yes [provider]      VITAL SIGNS:  Blood pressure 126/82, pulse 79, temperature (!) 97.5 F (36.4 C), temperature source Oral, resp. rate 17, height 5\' 2"  (1.575 m), weight 78 kg, SpO2 100 %.  PHYSICAL EXAMINATION:  Physical Exam  GENERAL:  81 y.o.-year-old Caucasian female patient lying in the bed with no acute distress.  EYES: Pupils equal, round, reactive to light and accommodation. No scleral icterus. Extraocular muscles intact.  HEENT: Head atraumatic, normocephalic. Oropharynx and nasopharynx clear.  NECK:  Supple, no jugular venous distention. No thyroid enlargement, no tenderness.  LUNGS: Normal breath sounds bilaterally, no wheezing, rales,rhonchi or crepitation. No use of accessory muscles of respiration.  CARDIOVASCULAR: Regular rate and rhythm, S1, S2 normal. No  murmurs, rubs, or gallops.  ABDOMEN: Soft, nondistended, nontender with significantly diminished breath sounds.  No organomegaly or mass.  EXTREMITIES: No pedal edema, cyanosis, or clubbing.  NEUROLOGIC: Cranial nerves II through XII are intact. Muscle strength 5/5 in all extremities. Sensation intact. Gait not checked.  PSYCHIATRIC: The patient is alert and oriented x 3.  Normal affect and good eye contact. SKIN: No obvious rash, lesion, or ulcer.   LABORATORY PANEL:   CBC Recent Labs  Lab 06/21/21 2145  WBC 15.5*  HGB 15.2*  HCT 44.6  PLT 321   ------------------------------------------------------------------------------------------------------------------  Chemistries  Recent Labs  Lab 06/17/21 0436 06/18/21 0354 06/21/21 2145  NA  --    < > 136  K  --    < > 3.5  CL  --    < > 102  CO2  --    < > 24  GLUCOSE  --    < > 127*  BUN  --    < > 20  CREATININE  --    < > 0.93  CALCIUM  --    < > 10.3  MG 1.9  --   --   AST  --   --  28  ALT  --   --  22  ALKPHOS  --   --  57   BILITOT  --   --  0.6   < > = values in this interval not displayed.   ------------------------------------------------------------------------------------------------------------------  Cardiac Enzymes No results for input(s): TROPONINI in the last 168 hours. ------------------------------------------------------------------------------------------------------------------  RADIOLOGY:  CT Abdomen Pelvis W Contrast  Result Date: 06/22/2021 CLINICAL DATA:  Nausea/vomiting Abdominal pain, acute, nonlocalized EXAM: CT ABDOMEN AND PELVIS WITH CONTRAST TECHNIQUE: Multidetector CT imaging of the abdomen and pelvis was performed using the standard protocol following bolus administration of intravenous contrast. RADIATION DOSE REDUCTION: This exam was performed according to the departmental dose-optimization program which includes automated exposure control, adjustment of the mA and/or kV according to patient size and/or use of iterative reconstruction technique. CONTRAST:  183mL OMNIPAQUE IOHEXOL 300 MG/ML  SOLN COMPARISON:  06/16/2021 FINDINGS: Lower chest: No acute abnormality. Hepatobiliary: Scattered hepatic cysts, stable. Prior cholecystectomy. Pancreas: No focal abnormality or ductal dilatation. Spleen: No focal abnormality.  Normal size. Adrenals/Urinary Tract: Small cyst in the lower pole of the left kidney. No stones or hydronephrosis. Adrenal glands and urinary bladder unremarkable. Stomach/Bowel: Prominent proximal and mid small bowel loops. Distal small bowel loops are decompressed. Findings are concerning for partial small bowel obstruction. Exact transition point not visualized. Vascular/Lymphatic: No evidence of aneurysm or adenopathy. Reproductive: Prior hysterectomy.  No adnexal masses. Other: No free fluid or free air. Musculoskeletal: No acute bony abnormality. IMPRESSION: Mildly prominent proximal and mid small bowel loops. Distal small bowel loops are decompressed. Findings concerning for  partial small bowel obstruction. Exact transition point not visualized. Electronically Signed   By: Rolm Baptise M.D.   On: 06/22/2021 03:25   DG Chest Port 1 View  Result Date: 06/22/2021 CLINICAL DATA:  Vomiting EXAM: PORTABLE CHEST 1 VIEW COMPARISON:  None. FINDINGS: Heart and mediastinal contours are within normal limits. No focal opacities or effusions. No acute bony abnormality. IMPRESSION: No active disease. Electronically Signed   By: Rolm Baptise M.D.   On: 06/22/2021 02:40      IMPRESSION AND PLAN:  Principal Problem:   Partial small bowel obstruction ([place])  1.  Partial small bowel obstruction. - The patient will be admitted to a medical bed. - We  will keep her n.p.o. except for medications with sips of water. - We will hydrate with IV normal saline with added potassium chloride. - We will follow two-view abdomen x-ray in AM. - General surgery consult will be obtained. - I notified Dr. Hampton Abbot about the patient.  2.  Essential hypertension. - We will continue lisinopril.  3.  Dyslipidemia. - We will continue statin therapy.  4.  GERD. - We will continue PPI therapy.   DVT prophylaxis: Lovenox. Advanced Care Planning:  Code Status: full code. Family Communication:  The plan of care was discussed in details with the patient (and family). I answered all questions. The patient agreed to proceed with the above mentioned plan. Further management will depend upon hospital course. Disposition Plan: Back to previous home environment Consults called: none. All the records are reviewed and case discussed with ED provider.  Status is: Inpatient  At the time of the admission, it appears that the appropriate admission status for this patient is inpatient.  This is judged to be reasonable and necessary in order to provide the required intensity of service to ensure the patient's safety given the presenting symptoms, physical exam findings and initial radiographic and laboratory  data in the context of comorbid conditions.  The patient requires inpatient status due to high intensity of service, high risk of further deterioration and high frequency of surveillance required.  I certify that at the time of admission, it is my clinical judgment that the patient will require inpatient hospital care extending more than 2 midnights.                            Dispo: The patient is from: Home              Anticipated d/c is to: Home              Patient currently is not medically stable to d/c.              Difficult to place patient: No  Christel Mormon M.D on 06/22/2021 at 4:55 AM  [facility]   From 7 PM-7 AM, contact night-coverage [email or web address]  CC: Primary care physician; Marinda Elk, MD

## 2021-06-22 NOTE — ED Provider Notes (Signed)
[facility] Provider Note    Event Date/Time   First MD Initiated Contact with Patient 06/22/21 0201     (approximate)   History   Abdominal Pain   HPI  History obtained via patient and her son  [name] is a 81 y.o. female who presents to the ED from home with a chief complaint of abdominal pain, nausea/vomiting/diarrhea.  Patient with a history of SBO; admitted 06/16/2021-06/18/2021 for partial SBO and gastroenteritis.  Reports starting to eat small bites of chicken tonight.  Felt abdominal distention and nausea.  Vomited and had a loose bowel movement in triage.  States she feels worse tonight than she did last week when she was hospitalized.  Denies fever, cough, chest pain, shortness of breath.  Reports history of vulvar dermatitis so states her urine always looks like it is infected.     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   Non Hodgkin's lymphoma ([place])    Non Hodgkin's lymphoma ([place])    RAD (reactive airway disease)    Skin cancer      Active Problem List   Patient Active Problem List   Diagnosis Date Noted   Essential hypertension 06/16/2021   Pyuria 06/16/2021   Partial intestinal obstruction ([place])    Small bowel obstruction ([place]) 05/31/2020   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[place]) 83/41/9622   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   Ingrown toenail 08/03/2015   S/P nail surgery 08/03/2015   Mammogram abnormal 12/29/2014    Screening mammogram for high-risk patient 12/29/2014   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 [facility]) 09/13/2014   Allergic rhinitis 06/02/2014   Barrett esophagus 06/02/2014   Benign essential tremor 06/02/2014   Bronchitis, chronic ([place]) 06/02/2014   Arthritis, degenerative 06/02/2014   Acid reflux 06/02/2014   History of colon polyps 06/02/2014   Lymphoma, non-Hodgkin's ([place]) 06/02/2014   RAD (reactive airway disease) 06/02/2014   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   Chronic bronchitis ([place]) 06/02/2014   Non-Hodgkin's lymphoma ([place]) 06/02/2014   Gastro-esophageal reflux disease without esophagitis 06/02/2014   HLD (hyperlipidemia) 12/10/2013   Adenitis, salivary, 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/2012   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     Past Surgical History   Past Surgical History:  Procedure Laterality Date   ABDOMINAL HYSTERECTOMY     APPENDECTOMY     BASAL CELL CARCINOMA EXCISION     on both sides of nose   BREAST BIOPSY Left 12/2013   hyperplasia   CHOLECYSTECTOMY     COLONOSCOPY WITH PROPOFOL N/A 06/05/2017   Procedure: COLONOSCOPY WITH PROPOFOL;  Surgeon: Lollie Sails, MD;  Location: [facility] ENDOSCOPY;  Service: Endoscopy;  Laterality: N/A;   COLONOSCOPY WITH PROPOFOL N/A 05/14/2020   Procedure: COLONOSCOPY WITH PROPOFOL;  Surgeon: Lesly Rubenstein, MD;  Location: [facility] ENDOSCOPY;  Service: Endoscopy;  Laterality: N/A;   ESOPHAGOGASTRODUODENOSCOPY (EGD) WITH PROPOFOL N/A 03/30/2015   Procedure: ESOPHAGOGASTRODUODENOSCOPY (EGD) WITH PROPOFOL;  Surgeon: Lollie Sails, MD;  Location[facility] ENDOSCOPY;  Service: Endoscopy;  Laterality: N/A;   ESOPHAGOGASTRODUODENOSCOPY (EGD) WITH PROPOFOL N/A 06/05/2017   Procedure: ESOPHAGOGASTRODUODENOSCOPY (EGD) WITH PROPOFOL;  Surgeon: Lollie Sails, MD;  Location[facility] ENDOSCOPY;  Service: Endoscopy;  Laterality: N/A;   ESOPHAGOGASTRODUODENOSCOPY (EGD) WITH  PROPOFOL N/A 05/14/2020   Procedure: ESOPHAGOGASTRODUODENOSCOPY (EGD) WITH PROPOFOL;  Surgeon: Lesly Rubenstein, MD;  Location: [facility] ENDOSCOPY;  Service: Endoscopy;  Laterality: N/A;   FACIAL COSMETIC SURGERY     history of multiple colonic polyps     lymph node removal from left groin     OOPHORECTOMY     VENTRAL HERNIA REPAIR N/A 10/02/2014   Procedure: HERNIA REPAIR VENTRAL ADULT;  Surgeon: Leonie Green, MD;  Location: [facility] ORS;  Service: General;  Laterality: N/A;  with mesh     Home Medications   Prior to Admission medications   Medication Sig Start Date End Date Taking? Authorizing Provider  Calcium Carbonate-Vit D-Min (CALCIUM 1200 PO) Take 1 tablet by mouth 1 day or 1 dose.    [provider]  Cholecalciferol (VITAMIN D3 GUMMIES) 25 MCG (1000 UT) CHEW Chew 1,000 Units by mouth daily.    [provider]  lisinopril (PRINIVIL,ZESTRIL) 10 MG tablet Take 10 mg by mouth daily. 09/21/16   [provider]  loratadine (CLARITIN) 10 MG tablet Take by mouth.    [provider]  omeprazole (PRILOSEC) 20 MG capsule Take 20 mg by mouth daily. 06/08/15   [provider]  rosuvastatin (CRESTOR) 5 MG tablet Take by mouth. 08/12/20 08/12/21  [provider]     Allergies  Buprenorphine hcl, Dilaudid [hydromorphone hcl], and Morphine and related   Family History   Family History  Problem Relation Age of Onset   Alcohol abuse Father    Stroke Father    Diabetes Sister    Kidney cancer Sister    Melanoma Brother    Diabetes Brother    Heart attack Brother    Breast cancer Neg Hx    Bladder Cancer Neg Hx    Prostate cancer Neg Hx      Physical Exam  Triage Vital Signs: ED Triage Vitals  Enc Vitals Group     BP 06/21/21 2136 122/78     Pulse Rate 06/21/21 2136 91     Resp 06/21/21 2136 (!) 24     Temp 06/21/21 2136 (!) 97.5 F (36.4 C)     Temp Source 06/21/21 2136 Oral     SpO2 06/21/21 2136 96 %     Weight 06/21/21  2137 172 lb (78 kg)     Height 06/21/21 2137 5\' 2"  (1.575 m)     Head Circumference --      Peak Flow --      Pain Score 06/21/21 2136 10     Pain Loc --      Pain Edu? --      Excl. in [place]? --     Updated Vital Signs: BP (!) 110/58    Pulse 82    Temp (!) 97.5 F (36.4 C) (Oral)    Resp 17    Ht 5\' 2"  (1.575 m)    Wt 78 kg    SpO2 95%    BMI 31.46 kg/m    General: Awake, mild distress.  CV:  RRR.  Good peripheral perfusion.  Resp:  Normal effort.  CTA B. Abd:  Mild diffuse tenderness without rebound or guarding.  Mild distention. + Bowel sounds. Other:     ED Results / Procedures / Treatments  Labs (all labs ordered are listed, but only abnormal results are displayed) Labs Reviewed  COMPREHENSIVE METABOLIC PANEL - Abnormal; Notable for the following components:      Result Value   Glucose, Bld 127 (*)    All other components within normal limits  CBC - Abnormal; Notable for the following components:   WBC 15.5 (*)    RBC 5.24 (*)    Hemoglobin 15.2 (*)    All other components within normal limits  RESP PANEL BY RT-PCR (FLU A&B, COVID) ARPGX2  LIPASE, BLOOD  URINALYSIS, ROUTINE W REFLEX MICROSCOPIC  TROPONIN I (HIGH SENSITIVITY)     EKG  ED ECG REPORT I, Sherol Sabas J, the attending physician, personally viewed and interpreted this ECG.   Date: 06/22/2021  EKG Time: 2150  Rate: 103  Rhythm: sinus tachycardia  Axis: Normal  Intervals:none  ST&T Change: Nonspecific    RADIOLOGY I have personally reviewed patient's chest x-ray, CT abdomen pelvis as well as the radiology interpretation:  Chest x-ray: No acute cardiopulmonary process  CT abdomen pelvis: Partial SBO  Official radiology report(s): CT Abdomen Pelvis W Contrast  Result Date: 06/22/2021 CLINICAL DATA:  Nausea/vomiting Abdominal pain, acute, nonlocalized EXAM: CT ABDOMEN AND PELVIS WITH CONTRAST TECHNIQUE: Multidetector CT imaging of the abdomen and pelvis was performed using the standard  protocol following bolus administration of intravenous contrast. RADIATION DOSE REDUCTION: This exam was performed according to the departmental dose-optimization program which includes automated exposure control, adjustment of the mA and/or kV according to patient size and/or use of iterative reconstruction technique. CONTRAST:  133mL OMNIPAQUE IOHEXOL 300 MG/ML  SOLN COMPARISON:  06/16/2021 FINDINGS: Lower chest: No acute abnormality. Hepatobiliary: Scattered hepatic cysts, stable. Prior cholecystectomy. Pancreas: No focal abnormality or ductal dilatation. Spleen: No focal abnormality.  Normal size. Adrenals/Urinary Tract: Small cyst in the lower pole of the left kidney. No stones or hydronephrosis. Adrenal glands and urinary bladder unremarkable. Stomach/Bowel: Prominent proximal and mid small bowel loops. Distal small bowel loops are decompressed. Findings are concerning for partial small bowel obstruction. Exact transition point not visualized. Vascular/Lymphatic: No evidence of aneurysm or adenopathy. Reproductive: Prior hysterectomy.  No adnexal masses. Other: No free fluid or free air. Musculoskeletal: No acute bony abnormality. IMPRESSION: Mildly prominent proximal and mid small bowel loops. Distal small bowel loops are decompressed. Findings concerning for partial small bowel obstruction. Exact transition point not visualized. Electronically Signed   By: Rolm Baptise M.D.   On: 06/22/2021 03:25   DG Chest Port 1 View  Result Date: 06/22/2021 CLINICAL DATA:  Vomiting EXAM: PORTABLE CHEST 1 VIEW COMPARISON:  None. FINDINGS: Heart and mediastinal contours are within normal limits. No focal opacities or effusions. No acute bony abnormality. IMPRESSION: No active disease. Electronically Signed   By: Rolm Baptise M.D.   On: 06/22/2021 02:40     PROCEDURES:  Critical Care performed: Yes, see critical care procedure note(s)  CRITICAL CARE Performed by: Paulette Blanch   Total critical care time: 30  minutes  Critical care time was exclusive of separately billable procedures and treating other patients.  Critical care was necessary to treat or prevent imminent or life-threatening deterioration.  Critical care was time spent personally by me on the following activities: development of treatment plan with patient and/or surrogate as well as nursing, discussions with  consultants, evaluation of patient's response to treatment, examination of patient, obtaining history from patient or surrogate, ordering and performing treatments and interventions, ordering and review of laboratory studies, ordering and review of radiographic studies, pulse oximetry and re-evaluation of patient's condition.   Marland Kitchen1-3 Lead EKG Interpretation Performed by: Paulette Blanch, MD Authorized by: Paulette Blanch, MD     Interpretation: normal     ECG rate:  80   ECG rate assessment: normal     Rhythm: sinus rhythm     Ectopy: none     Conduction: normal   Comments:     Patient placed on cardiac monitor to evaluate for arrhythmias   MEDICATIONS ORDERED IN ED: Medications  ondansetron (ZOFRAN-ODT) disintegrating tablet 4 mg (4 mg Oral Given 06/21/21 2144)  sodium chloride 0.9 % bolus 1,000 mL (1,000 mLs Intravenous New Bag/Given 06/22/21 0332)  ondansetron (ZOFRAN) injection 4 mg (4 mg Intravenous Given 06/22/21 0304)  fentaNYL (SUBLIMAZE) injection 50 mcg (50 mcg Intravenous Given 06/22/21 0305)  iohexol (OMNIPAQUE) 300 MG/ML solution 100 mL (100 mLs Intravenous Contrast Given 06/22/21 0318)     IMPRESSION / MDM / ASSESSMENT AND PLAN / ED COURSE  I reviewed the triage vital signs and the nursing notes.                             81 year old female presenting with abdominal pain, nausea/vomiting/diarrhea; recent admission for partial SBO and gastroenteritis. Differential diagnosis includes, but is not limited to, ovarian cyst, ovarian torsion, acute appendicitis, diverticulitis, urinary tract infection/pyelonephritis,  endometriosis, bowel obstruction, colitis, renal colic, gastroenteritis, hernia, etc. I have personally reviewed patient's records and reviewed her recent hospitalization 06/16/2021-06/18/2021 for partial SBO and gastroenteritis.  The patient is on the cardiac monitor to evaluate for evidence of arrhythmia and/or significant heart rate changes.  Laboratory results demonstrate moderate leukocytosis WBC 15.5 which is elevated from recent hospitalization, normal electrolytes.  Will check troponin, CT abdomen pelvis.  Initiate IV fluid resuscitation.  Patient had good success with IV fentanyl recently and hypotensive experience with Dilaudid, nausea/vomiting with morphine.  We will continue with IV fentanyl for pain paired with IV Zofran for nausea.  Will reassess.  Clinical Course as of 06/22/21 0346  Wed Jun 22, 2021  0345 Patient currently pain-free after IV fentanyl.  Updated patient and son on CT imaging result concerning for partial SBO.  We will hold off NG tube for now as patient is not vomiting.  Consider NG tube if patient begins to vomit or have intractable vomiting.  Will consult hospitalist services for evaluation and admission. [JS]    Clinical Course User Index [JS] Paulette Blanch, MD     FINAL CLINICAL IMPRESSION(S) / ED DIAGNOSES   Final diagnoses:  Pain of upper abdomen  Nausea vomiting and diarrhea  Partial small bowel obstruction ([place])     Rx / DC Orders   ED Discharge Orders     None        Note:  This document was prepared using Dragon voice recognition software and may include unintentional dictation errors.   Paulette Blanch, MD 06/22/21 [phone]

## 2021-06-22 NOTE — ED Notes (Signed)
Patient to CT.

## 2021-06-22 NOTE — ED Notes (Signed)
Pt given warm blanket.  Pt refused some medications due to concerns of vomiting because she has nothing on her stomach due to NPO status. Son at bedside.  Call bell in reach.

## 2021-06-22 NOTE — ED Notes (Signed)
Pt resting comfortably at this time. Pt denies any needs at this time. Call bell in reach. VSS. Pt given mesh panties per request.

## 2021-06-22 NOTE — ED Notes (Signed)
IV attempt x 2 in the left forearm and left AC without success. Called another RN, pt tolerated fair.

## 2021-06-22 NOTE — Progress Notes (Addendum)
°  INTERVAL PROGRESS NOTE    [name]- 81 y.o. female  LOS: 0 __________________________________________________________________  SUBJECTIVE: Admitted 06/22/2021 with cc of abdominal pain, poor PO 2/2 partial SBO. Since admission, kept NPO. Experiencing nausea. Seen by general surgery. Risk factors include significant amount of abdominal surgeries in past.  OBJECTIVE: Blood pressure 123/71, pulse 71, temperature 98.5 F (36.9 C), temperature source Oral, resp. rate 18, height 5\' 2"  (1.575 m), weight 78 kg, SpO2 97 %.  ASSESSMENT/PLAN:  I have reviewed the full H&P by Dr. Sidney Ace, and I agree with the assessment and plan as outlined therein. In addition:  Partial SBO - continue NPO status and mIV fluids - defer NG tube, per general surgery  Hypotension- improved to normotensive  As patient is not able to take oral medications due to nausea and obstruction, reviewed medications to see what can/should be transitioned to IV.   Principal Problem:   Partial small bowel obstruction ([place])   Richarda Osmond, DO [facility] 06/22/2021, 4:11 PM    [email or web address] Available by Epic secure chat 7AM-7PM. If 7PM-7AM, please contact night-coverage   No Charge

## 2021-06-22 NOTE — ED Notes (Signed)
Surgery at bedside.

## 2021-06-23 DIAGNOSIS — K56600 Partial intestinal obstruction, unspecified as to cause: Secondary | ICD-10-CM | POA: Diagnosis not present

## 2021-06-23 NOTE — TOC Initial Note (Addendum)
Transition of Care [facility]) - Initial/Assessment Note    Patient Details  Name: [name] MRN: [id number] Date of Birth: Dec 05, 1940  Transition of [facility]) CM/SW Contact:    Beverly Sessions, RN Phone Number: 06/23/2021, 1:09 PM  Clinical Narrative:                  Transition of Care (TOC) Screening Note   Patient Details  Name: [name] Date of Birth: 1940/11/08   Transition of Care [facility]) CM/SW Contact:    Beverly Sessions, RN Phone Number: 06/23/2021, 1:10 PM    Transition of Care Department [facility]) has reviewed patient and no TOC needs have been identified at this time. We will continue to monitor patient advancement through interdisciplinary progression rounds. If new patient transition needs arise, please place a TOC consult.          Patient Goals and CMS Choice        Expected Discharge Plan and Services                                                Prior Living Arrangements/Services                       Activities of Daily Living Home Assistive Devices/Equipment: None ADL Screening (condition at time of admission) Patient's cognitive ability adequate to safely complete daily activities?: Yes Is the patient deaf or have difficulty hearing?: No Does the patient have difficulty seeing, even when wearing glasses/contacts?: No Does the patient have difficulty concentrating, remembering, or making decisions?: No Patient able to express need for assistance with ADLs?: Yes Does the patient have difficulty dressing or bathing?: No Independently performs ADLs?: Yes (appropriate for developmental age) Does the patient have difficulty walking or climbing stairs?: No Weakness of Legs: None Weakness of Arms/Hands: None  Permission Sought/Granted                  Emotional Assessment              Admission diagnosis:  SBO (small bowel obstruction) ([place]) [K56.609] Partial small bowel obstruction (HCC)  [K56.600] Nausea vomiting and diarrhea [R11.2, R19.7] Pain of upper abdomen [R10.10] Patient Active Problem List   Diagnosis Date Noted   Partial small bowel obstruction ([place]) 06/22/2021   Essential hypertension 06/16/2021   Pyuria 06/16/2021   Partial intestinal obstruction ([place])    Small bowel obstruction ([place]) 05/31/2020   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[place]) 63/14/9702   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 [place] [address] Phone: [phone] Fax: [phone]     Social Determinants of Health (SDOH) Interventions    Readmission Risk Interventions No flowsheet data found.

## 2021-06-23 NOTE — Progress Notes (Signed)
PROGRESS NOTE  [name]    DOB: [birth year], 81 y.o.  EPP:[id number]    Code Status: Full Code   DOA: 06/22/2021   LOS: 1   Brief hospital course  [name] is a 81 y.o. female with a PMH significant for GERD, HTN, HLD, migraine, non-Hodgkin's lymphoma, OA. They presented from home to the ED on 06/22/2021 with nausea and vomiting x 2 days. She has a significant history of abdominal surgeries and bowel obstruction.  In the ED, it was found that they had partial small bowel obstruction on abdominal CT.  They were treated with bowel rest and supportive care. NG tube was deferred. General surgery was consulted.  Patient was admitted to medicine service for further workup and management of SBO as outlined in detail below.  06/23/21 -stable, improved  Assessment & Plan  Principal Problem:   Partial small bowel obstruction (HCC)  Partial SBO- improving. Patient feels that she could eat today - initial clear liquid diet - general surgery following, appreciate recs - analgesia and antiemetics PRN  HTN- chronic, stable - continue home lisinopril  HLD - continue home statin  GERD - continue home PPI  Body mass index is 31.46 kg/m.  VTE ppx: enoxaparin (LOVENOX) injection 40 mg Start: 06/22/21 0800   Diet:     Diet   Diet NPO time specified Except for: Sips with Meds   Subjective 06/23/21    Pt reports feeling much improved today. No BM yet but her nausea and abdominal pain have improved. She feels ready to start drinking fluids today.    Objective   Vitals:   06/22/21 1329 06/22/21 1521 06/22/21 1955 06/23/21 0423  BP: 114/72 123/71 (!) 114/58 105/63  Pulse: 78 71 80 70  Resp: 18 18 16 18   Temp: 98.2 F (36.8 C) 98.5 F (36.9 C) 98.5 F (36.9 C) 97.9 F (36.6 C)  TempSrc: Oral Oral    SpO2: 98% 97% 95% 95%  Weight:      Height:        Intake/Output Summary (Last 24 hours) at 06/23/2021 0739 Last data filed at 06/22/2021 1500 Gross per 24 hour  Intake  713.17 ml  Output --  Net 713.17 ml   Filed Weights   06/21/21 2137  Weight: 78 kg     Physical Exam:  General: awake, alert, NAD HEENT: atraumatic, clear conjunctiva, anicteric sclera, MMM, hearing grossly normal Respiratory: normal respiratory effort. Cardiovascular: normal S1/S2, RRR, no JVD, murmurs, quick capillary refill  Gastrointestinal: soft, NT, ND Nervous: A&O x3. no gross focal neurologic deficits, normal speech Extremities: moves all equally, no edema, normal tone Skin: dry, intact, normal temperature, normal color. No rashes, lesions or ulcers on exposed skin Psychiatry: normal mood, congruent affect  Labs   I have personally reviewed the following labs and imaging studies CBC    Component Value Date/Time   WBC 6.9 06/22/2021 0625   RBC 4.29 06/22/2021 0625   HGB 12.6 06/22/2021 0625   HGB 15.0 03/03/2014 1022   HCT 36.8 06/22/2021 0625   HCT 44.2 03/03/2014 1022   PLT 258 06/22/2021 0625   PLT 295 03/03/2014 1022   MCV 85.8 06/22/2021 0625   MCV 90 03/03/2014 1022   MCH 29.4 06/22/2021 0625   MCHC 34.2 06/22/2021 0625   RDW 12.8 06/22/2021 0625   RDW 13.3 03/03/2014 1022   LYMPHSABS 3.1 02/25/2021 0933   LYMPHSABS 2.1 03/03/2014 1022   MONOABS 0.6 02/25/2021 0933   MONOABS 0.6 03/03/2014  1022   EOSABS 0.2 02/25/2021 0933   EOSABS 0.1 03/03/2014 1022   BASOSABS 0.0 02/25/2021 0933   BASOSABS 0.1 03/03/2014 1022   BMP Latest Ref Rng & Units 06/22/2021 06/21/2021 06/18/2021  Glucose 70 - 99 mg/dL 102(H) 127(H) 93  BUN 8 - 23 mg/dL 22 20 16   Creatinine 0.44 - 1.00 mg/dL 0.87 0.93 0.82  Sodium 135 - 145 mmol/L 136 136 141  Potassium 3.5 - 5.1 mmol/L 4.2 3.5 4.1  Chloride 98 - 111 mmol/L 105 102 107  CO2 22 - 32 mmol/L 23 24 26   Calcium 8.9 - 10.3 mg/dL 8.9 10.3 8.9    CT Abdomen Pelvis W Contrast  Result Date: 06/22/2021 CLINICAL DATA:  Nausea/vomiting Abdominal pain, acute, nonlocalized EXAM: CT ABDOMEN AND PELVIS WITH CONTRAST TECHNIQUE:  Multidetector CT imaging of the abdomen and pelvis was performed using the standard protocol following bolus administration of intravenous contrast. RADIATION DOSE REDUCTION: This exam was performed according to the departmental dose-optimization program which includes automated exposure control, adjustment of the mA and/or kV according to patient size and/or use of iterative reconstruction technique. CONTRAST:  157mL OMNIPAQUE IOHEXOL 300 MG/ML  SOLN COMPARISON:  06/16/2021 FINDINGS: Lower chest: No acute abnormality. Hepatobiliary: Scattered hepatic cysts, stable. Prior cholecystectomy. Pancreas: No focal abnormality or ductal dilatation. Spleen: No focal abnormality.  Normal size. Adrenals/Urinary Tract: Small cyst in the lower pole of the left kidney. No stones or hydronephrosis. Adrenal glands and urinary bladder unremarkable. Stomach/Bowel: Prominent proximal and mid small bowel loops. Distal small bowel loops are decompressed. Findings are concerning for partial small bowel obstruction. Exact transition point not visualized. Vascular/Lymphatic: No evidence of aneurysm or adenopathy. Reproductive: Prior hysterectomy.  No adnexal masses. Other: No free fluid or free air. Musculoskeletal: No acute bony abnormality. IMPRESSION: Mildly prominent proximal and mid small bowel loops. Distal small bowel loops are decompressed. Findings concerning for partial small bowel obstruction. Exact transition point not visualized. Electronically Signed   By: Rolm Baptise M.D.   On: 06/22/2021 03:25   DG Chest Port 1 View  Result Date: 06/22/2021 CLINICAL DATA:  Vomiting EXAM: PORTABLE CHEST 1 VIEW COMPARISON:  None. FINDINGS: Heart and mediastinal contours are within normal limits. No focal opacities or effusions. No acute bony abnormality. IMPRESSION: No active disease. Electronically Signed   By: Rolm Baptise M.D.   On: 06/22/2021 02:40   DG Abd 2 Views  Result Date: 06/22/2021 CLINICAL DATA:  81 year old female with  24 hours of abdominal distension, nausea vomiting and diarrhea. Questionable partial small-bowel obstruction on CT earlier today. EXAM: ABDOMEN - 2 VIEW COMPARISON:  CT Abdomen and Pelvis 0317 hours today. FINDINGS: Portable AP upright and supine views at 1001 hours. Bowel-gas pattern has not significantly changed from the CT this morning, except for perhaps increased gas throughout the large bowel now. External safety pen artifact. Stable cholecystectomy clips. No pneumoperitoneum. Negative lung bases. No acute osseous abnormality identified. Excreted IV contrast in the urinary bladder. IMPRESSION: Stable to mildly improved bowel-gas pattern since 0317 hours today, now more suggestive of ileus than a mechanical bowel obstruction. No free air. Electronically Signed   By: Genevie Ann M.D.   On: 06/22/2021 10:18    Disposition Plan & Communication  Patient status: Inpatient  Admitted From: Home Planned disposition location: Home Anticipated discharge date: 2/17 pending continued improvement in her diet tolerability  Family Communication: none    Author: Richarda Osmond, DO [facility] 06/23/2021, 7:39 AM   Available by Epic secure chat  7AM-7PM. If 7PM-7AM, please contact night-coverage.  [facility] contact information found on [email or web address].

## 2021-06-23 NOTE — Progress Notes (Signed)
06/23/2021  Subjective: No acute events.  Patient reports having flatus and denies any further pain or nausea.  WBC normalized yesterday.  Afebrile, stable vitals.  Vital signs: Temp:  [97.8 F (36.6 C)-98.6 F (37 C)] 97.8 F (36.6 C) (02/16 0739) Pulse Rate:  [42-80] 57 (02/16 0739) Resp:  [16-18] 18 (02/16 0739) BP: (105-124)/(58-74) 124/74 (02/16 0739) SpO2:  [93 %-98 %] 97 % (02/16 0739)   Intake/Output: 02/15 0701 - 02/16 0700 In: 713.2 [I.V.:713.2] Out: -  Last BM Date : 06/21/21  Physical Exam: Constitutional:  No acute distress Abdomen:  soft, non-distended, non-tender to palpation.  Labs:  Recent Labs    06/21/21 2145 06/22/21 0625  WBC 15.5* 6.9  HGB 15.2* 12.6  HCT 44.6 36.8  PLT 321 258   Recent Labs    06/21/21 2145 06/22/21 0625  NA 136 136  K 3.5 4.2  CL 102 105  CO2 24 23  GLUCOSE 127* 102*  BUN 20 22  CREATININE 0.93 0.87  CALCIUM 10.3 8.9   No results for input(s): LABPROT, INR in the last 72 hours.  Imaging: No results found.  Assessment/Plan: This is a 81 y.o. female with resolving partial SBO  --Patient doing better today and is asking about diet.  I think we can start her on clear liquids. Discussed with her going slowly and she also wants to go slowly given her readmission.  Will keep on clears today. --Otherwise, continue ambulation, medical management per primary team.   I spent 25 minutes dedicated to the care of this patient on the date of this encounter to include pre-visit review of records, face-to-face time with the patient discussing diagnosis and management, and any post-visit coordination of care.  Melvyn Neth, [facility]

## 2021-06-24 DIAGNOSIS — K56600 Partial intestinal obstruction, unspecified as to cause: Secondary | ICD-10-CM | POA: Diagnosis not present

## 2021-06-24 MED ORDER — PANTOPRAZOLE SODIUM 40 MG PO TBEC: 40.0000 mg | DELAYED_RELEASE_TABLET | Freq: Every day | ORAL | Status: DC

## 2021-06-24 NOTE — Progress Notes (Signed)
06/24/2021  Subjective: No acute events.  Patient tolerated clear liquids without issues and denies any pain, nausea.  Having flatus and better bowel movements.  Vital signs: Temp:  [97.6 F (36.4 C)-98.4 F (36.9 C)] 97.8 F (36.6 C) (02/17 0752) Pulse Rate:  [61-101] 101 (02/17 0752) Resp:  [18] 18 (02/17 0752) BP: (114-140)/(56-81) 123/81 (02/17 0752) SpO2:  [95 %-98 %] 96 % (02/17 0752)   Intake/Output: No intake/output data recorded. Last BM Date : 06/23/21  Physical Exam: Constitutional: No acute distress Abdomen:  Soft, non-distended, non-tender to palpation.  Labs:  Recent Labs    06/21/21 2145 06/22/21 0625  WBC 15.5* 6.9  HGB 15.2* 12.6  HCT 44.6 36.8  PLT 321 258   Recent Labs    06/21/21 2145 06/22/21 0625  NA 136 136  K 3.5 4.2  CL 102 105  CO2 24 23  GLUCOSE 127* 102*  BUN 20 22  CREATININE 0.93 0.87  CALCIUM 10.3 8.9   No results for input(s): LABPROT, INR in the last 72 hours.  Imaging: No results found.  Assessment/Plan: This is a 81 y.o. female with partial SBO, now resolved.  --Patient did great with clear liquids and her SBO has resolved. --Will advance to full liquids now and soft diet for lunch.  Should be ok to d/c home after lunch as long as she's doing ok. --No surgical follow up is needed.   I spent 25 minutes dedicated to the care of this patient on the date of this encounter to include pre-visit review of records, face-to-face time with the patient discussing diagnosis and management, and any post-visit coordination of care.  Melvyn Neth, [facility]

## 2021-06-24 NOTE — Discharge Instructions (Signed)
I recommend a high fiber diet to avoid constipation and promote good digestion.  Please follow up with your primary doctor in 1-2 weeks

## 2021-06-24 NOTE — Progress Notes (Signed)
PHARMACIST - PHYSICIAN COMMUNICATION  DR:   Ouida Sills  CONCERNING: IV to Oral Route Change Policy  RECOMMENDATION: This patient is receiving protonix by the intravenous route.  Based on criteria approved by the Pharmacy and Therapeutics Committee, the intravenous medication(s) is/are being converted to the equivalent oral dose form(s).   DESCRIPTION: These criteria include: The patient is eating (either orally or via tube) and/or has been taking other orally administered medications for a least 24 hours The patient has no evidence of active gastrointestinal bleeding or impaired GI absorption (gastrectomy, short bowel, patient on TNA or NPO).  If you have questions about this conversion, please contact the Pharmacy Department   [x]   [phone] )  [place], PharmD, [place] PGPM Clinical Pharmacist 06/24/2021 9:13 AM

## 2021-06-24 NOTE — Care Management Important Message (Signed)
Important Message  Patient Details  Name: [name] MRN: [id number] Date of Birth: 1941/03/22   Medicare Important Message Given:  Yes     Dannette Barbara 06/24/2021, 11:48 AM

## 2021-06-24 NOTE — Progress Notes (Signed)
Pt discharged per MD order. IV removed. Discharge instructions reviewed with pt. Pt verbalized understanding with all questions answered to pt satisfaction.

## 2021-06-24 NOTE — Discharge Summary (Signed)
Physician Discharge Summary  Patient: [name] XLK:[id number] DOB: 13-Feb-1941   Code Status: Full Code Admit date: 06/22/2021 Discharge date: 07/05/2021 Disposition: Home, No home health services recommended PCP: Marinda Elk, MD  Recommendations for Outpatient Follow-up:  Follow up with PCP within 1-2 weeks   Discharge Diagnoses:  Principal Problem:   Partial small bowel obstruction [facility])  Brief Hospital Course Summary: [name] is a 81 y.o. female with a PMH significant for GERD, HTN, HLD, migraine, non-Hodgkin's lymphoma, OA. They presented from home to the ED on 06/22/2021 with nausea and vomiting x 2 days. She has a significant history of abdominal surgeries and bowel obstruction.  In the ED, it was found that they had partial small bowel obstruction on abdominal CT.  They were treated with bowel rest and supportive care. NG tube was deferred. General surgery was consulted.   Discharge Condition: Stable, improved Recommended discharge diet:  soft, healthy  Consultations: General surgery  Procedures/Studies: None    Allergies as of Jul 05, 2021       Reactions   Buprenorphine Hcl Nausea And Vomiting   Dilaudid [hydromorphone Hcl] Anxiety, Other (See Comments)   Hypotension    Morphine And Related Nausea And Vomiting        Medication List     STOP taking these medications    CALCIUM 1200 PO   Vitamin D3 Gummies 25 MCG (1000 UT) Chew Generic drug: Cholecalciferol       TAKE these medications    lisinopril 10 MG tablet Commonly known as: ZESTRIL Take 10 mg by mouth daily.   loratadine 10 MG tablet Commonly known as: CLARITIN Take 10 mg by mouth daily.   omeprazole 20 MG capsule Commonly known as: PRILOSEC Take 20 mg by mouth daily.   rosuvastatin 5 MG tablet Commonly known as: CRESTOR Take 5 mg by mouth daily.         Subjective   Kendra reports feeling much improved. She denies abdominal pain. She is passing gas and able  to have a BM. She tolerated the full liquid diet well and also a soft meal.  Objective  Blood pressure 123/81, pulse (!) 101, temperature 97.8 F (36.6 C), temperature source Oral, resp. rate 18, height 5\' 2"  (1.575 m), weight 78 kg, SpO2 96 %.   General: Kendra is alert, awake, not in acute distress Cardiovascular: RRR, S1/S2 +, no rubs, no gallops Respiratory: CTA bilaterally, no wheezing, no rhonchi Abdominal: Soft, NT, ND, bowel sounds + Extremities: no edema, no cyanosis   The results of significant diagnostics from this hospitalization (including imaging, microbiology, ancillary and laboratory) are listed below for reference.   Imaging studies: CT Abdomen Pelvis W Contrast  Result Date: 06/22/2021 CLINICAL DATA:  Nausea/vomiting Abdominal pain, acute, nonlocalized EXAM: CT ABDOMEN AND PELVIS WITH CONTRAST TECHNIQUE: Multidetector CT imaging of the abdomen and pelvis was performed using the standard protocol following bolus administration of intravenous contrast. RADIATION DOSE REDUCTION: This exam was performed according to the departmental dose-optimization program which includes automated exposure control, adjustment of the mA and/or kV according to patient size and/or use of iterative reconstruction technique. CONTRAST:  178mL OMNIPAQUE IOHEXOL 300 MG/ML  SOLN COMPARISON:  06/16/2021 FINDINGS: Lower chest: No acute abnormality. Hepatobiliary: Scattered hepatic cysts, stable. Prior cholecystectomy. Pancreas: No focal abnormality or ductal dilatation. Spleen: No focal abnormality.  Normal size. Adrenals/Urinary Tract: Small cyst in the lower pole of the left kidney. No stones or hydronephrosis. Adrenal glands and urinary bladder unremarkable.  Stomach/Bowel: Prominent proximal and mid small bowel loops. Distal small bowel loops are decompressed. Findings are concerning for partial small bowel obstruction. Exact transition point not visualized. Vascular/Lymphatic: No evidence of aneurysm or  adenopathy. Reproductive: Prior hysterectomy.  No adnexal masses. Other: No free fluid or free air. Musculoskeletal: No acute bony abnormality. IMPRESSION: Mildly prominent proximal and mid small bowel loops. Distal small bowel loops are decompressed. Findings concerning for partial small bowel obstruction. Exact transition point not visualized. Electronically Signed   By: Rolm Baptise M.D.   On: 06/22/2021 03:25   CT ABDOMEN PELVIS W CONTRAST  Result Date: 06/16/2021 CLINICAL DATA:  Upper abdominal pain and bloating. EXAM: CT ABDOMEN AND PELVIS WITH CONTRAST TECHNIQUE: Multidetector CT imaging of the abdomen and pelvis was performed using the standard protocol following bolus administration of intravenous contrast. RADIATION DOSE REDUCTION: This exam was performed according to the departmental dose-optimization program which includes automated exposure control, adjustment of the mA and/or kV according to patient size and/or use of iterative reconstruction technique. CONTRAST:  198mL OMNIPAQUE IOHEXOL 300 MG/ML  SOLN COMPARISON:  May 31, 2020. FINDINGS: Lower chest: No acute abnormality. Hepatobiliary: Status post cholecystectomy. No biliary dilatation is noted. Stable hepatic cysts are noted. Pancreas: Unremarkable. No pancreatic ductal dilatation or surrounding inflammatory changes. Spleen: Normal in size without focal abnormality. Adrenals/Urinary Tract: Adrenal glands are unremarkable. Kidneys are normal, without renal calculi, focal lesion, or hydronephrosis. Bladder is unremarkable. Stomach/Bowel: Status post appendectomy. The stomach is unremarkable. No colonic dilatation is noted. Moderate proximal small bowel dilatation is noted without definite transition zone. Is uncertain if this represents ileus or possibly partial bowel obstruction. Vascular/Lymphatic: No significant vascular findings are present. No enlarged abdominal or pelvic lymph nodes. Reproductive: Status post hysterectomy. No adnexal  masses. Other: No abdominal wall hernia or abnormality. No abdominopelvic ascites. Musculoskeletal: No acute or significant osseous findings. IMPRESSION: Moderate proximal small bowel dilatation is noted without definite transition zone. It is uncertain if this represents ileus or possibly partial small bowel obstruction. Status post cholecystectomy. Stable hepatic cysts. Electronically Signed   By: Marijo Conception M.D.   On: 06/16/2021 16:38   DG Chest Port 1 View  Result Date: 06/22/2021 CLINICAL DATA:  Vomiting EXAM: PORTABLE CHEST 1 VIEW COMPARISON:  None. FINDINGS: Heart and mediastinal contours are within normal limits. No focal opacities or effusions. No acute bony abnormality. IMPRESSION: No active disease. Electronically Signed   By: Rolm Baptise M.D.   On: 06/22/2021 02:40   DG Abd 2 Views  Result Date: 06/22/2021 CLINICAL DATA:  81 year old female with 24 hours of abdominal distension, nausea vomiting and diarrhea. Questionable partial small-bowel obstruction on CT earlier today. EXAM: ABDOMEN - 2 VIEW COMPARISON:  CT Abdomen and Pelvis 0317 hours today. FINDINGS: Portable AP upright and supine views at 1001 hours. Bowel-gas pattern has not significantly changed from the CT this morning, except for perhaps increased gas throughout the large bowel now. External safety pen artifact. Stable cholecystectomy clips. No pneumoperitoneum. Negative lung bases. No acute osseous abnormality identified. Excreted IV contrast in the urinary bladder. IMPRESSION: Stable to mildly improved bowel-gas pattern since 0317 hours today, now more suggestive of ileus than a mechanical bowel obstruction. No free air. Electronically Signed   By: Genevie Ann M.D.   On: 06/22/2021 10:18    Labs: Basic Metabolic Panel: Recent Labs  Lab 06/18/21 0354 06/21/21 2145 06/22/21 0625  NA 141 136 136  K 4.1 3.5 4.2  CL 107 102 105  CO2 26  24 23  GLUCOSE 93 127* 102*  BUN 16 20 22   CREATININE 0.82 0.93 0.87  CALCIUM 8.9  10.3 8.9   CBC: Recent Labs  Lab 06/21/21 2145 06/22/21 0625  WBC 15.5* 6.9  HGB 15.2* 12.6  HCT 44.6 36.8  MCV 85.1 85.8  PLT 321 258   Microbiology: none  Time coordinating discharge: Over 30 minutes  Richarda Osmond, MD  [facility] 06/24/2021, 2:27 PM

## 2021-07-13 ENCOUNTER — Telehealth: Payer: Self-pay

## 2021-07-13 DIAGNOSIS — Z1211 Encounter for screening for malignant neoplasm of colon: Secondary | ICD-10-CM

## 2021-07-13 NOTE — Telephone Encounter (Signed)
-----   Message from Lucilla Lame, MD sent at 06/27/2021 12:04 PM EST ----- ?Regarding: FW: referral ? ?----- Message ----- ?From: Olean Ree, MD ?Sent: 06/25/2021   2:32 AM EST ?To: Lucilla Lame, MD ?Subject: referral                                      ? ?Hi Darren, ? ?This patient was here in the hospital admitted for a partial small bowel obstruction and was discharged today.  She has a history of polyps and had been having colonoscopies with Dr. Ayesha Mohair in the past.  After he retired, she got set up with [facility], but she does not like him and would like a different gastroenterologist to continue her colonoscopies.  She has heard very good things about you and was wondering if she could be set up in your office for colonoscopies. ? ?Let me know if you need an official referral order and I can ask the office to order it. ? ?Thanks! ? ?Jose ? ? ? ? ?

## 2021-08-26 ENCOUNTER — Encounter: Payer: Self-pay | Admitting: Internal Medicine

## 2021-08-26 ENCOUNTER — Inpatient Hospital Stay (HOSPITAL_BASED_OUTPATIENT_CLINIC_OR_DEPARTMENT_OTHER): Payer: Medicare Other | Admitting: Internal Medicine

## 2021-08-26 ENCOUNTER — Inpatient Hospital Stay: Payer: Medicare Other | Attending: Internal Medicine

## 2021-08-26 DIAGNOSIS — Z823 Family history of stroke: Secondary | ICD-10-CM | POA: Insufficient documentation

## 2021-08-26 DIAGNOSIS — Z8051 Family history of malignant neoplasm of kidney: Secondary | ICD-10-CM | POA: Insufficient documentation

## 2021-08-26 DIAGNOSIS — Z9049 Acquired absence of other specified parts of digestive tract: Secondary | ICD-10-CM | POA: Insufficient documentation

## 2021-08-26 DIAGNOSIS — Z808 Family history of malignant neoplasm of other organs or systems: Secondary | ICD-10-CM | POA: Insufficient documentation

## 2021-08-26 DIAGNOSIS — Z8719 Personal history of other diseases of the digestive system: Secondary | ICD-10-CM | POA: Insufficient documentation

## 2021-08-26 DIAGNOSIS — Z8249 Family history of ischemic heart disease and other diseases of the circulatory system: Secondary | ICD-10-CM | POA: Diagnosis not present

## 2021-08-26 DIAGNOSIS — Z833 Family history of diabetes mellitus: Secondary | ICD-10-CM | POA: Diagnosis not present

## 2021-08-26 DIAGNOSIS — C8203 Follicular lymphoma grade I, intra-abdominal lymph nodes: Secondary | ICD-10-CM | POA: Diagnosis not present

## 2021-08-26 DIAGNOSIS — Z85828 Personal history of other malignant neoplasm of skin: Secondary | ICD-10-CM | POA: Diagnosis not present

## 2021-08-26 DIAGNOSIS — Z885 Allergy status to narcotic agent status: Secondary | ICD-10-CM | POA: Diagnosis not present

## 2021-08-26 DIAGNOSIS — Z79899 Other long term (current) drug therapy: Secondary | ICD-10-CM | POA: Diagnosis not present

## 2021-08-26 DIAGNOSIS — K56600 Partial intestinal obstruction, unspecified as to cause: Secondary | ICD-10-CM | POA: Diagnosis not present

## 2021-08-26 DIAGNOSIS — I10 Essential (primary) hypertension: Secondary | ICD-10-CM | POA: Diagnosis not present

## 2021-08-26 DIAGNOSIS — Z90721 Acquired absence of ovaries, unilateral: Secondary | ICD-10-CM | POA: Diagnosis not present

## 2021-08-26 DIAGNOSIS — M549 Dorsalgia, unspecified: Secondary | ICD-10-CM | POA: Diagnosis not present

## 2021-08-26 DIAGNOSIS — M2550 Pain in unspecified joint: Secondary | ICD-10-CM | POA: Insufficient documentation

## 2021-08-26 DIAGNOSIS — Z811 Family history of alcohol abuse and dependence: Secondary | ICD-10-CM | POA: Diagnosis not present

## 2021-08-26 LAB — CBC WITH DIFFERENTIAL/PLATELET
Abs Immature Granulocytes: 0.02 10*3/uL (ref 0.00–0.07)
Basophils Absolute: 0 10*3/uL (ref 0.0–0.1)
Basophils Relative: 1 %
Eosinophils Absolute: 0.1 10*3/uL (ref 0.0–0.5)
Eosinophils Relative: 1 %
HCT: 39.6 % (ref 36.0–46.0)
Hemoglobin: 13.4 g/dL (ref 12.0–15.0)
Immature Granulocytes: 0 %
Lymphocytes Relative: 44 %
Lymphs Abs: 2.4 10*3/uL (ref 0.7–4.0)
MCH: 29.6 pg (ref 26.0–34.0)
MCHC: 33.8 g/dL (ref 30.0–36.0)
MCV: 87.6 fL (ref 80.0–100.0)
Monocytes Absolute: 0.6 10*3/uL (ref 0.1–1.0)
Monocytes Relative: 10 %
Neutro Abs: 2.4 10*3/uL (ref 1.7–7.7)
Neutrophils Relative %: 44 %
Platelets: 257 10*3/uL (ref 150–400)
RBC: 4.52 MIL/uL (ref 3.87–5.11)
RDW: 13.8 % (ref 11.5–15.5)
WBC: 5.5 10*3/uL (ref 4.0–10.5)
nRBC: 0 % (ref 0.0–0.2)

## 2021-08-26 LAB — LACTATE DEHYDROGENASE: LDH: 107 U/L (ref 98–192)

## 2021-08-26 LAB — COMPREHENSIVE METABOLIC PANEL
ALT: 19 U/L (ref 0–44)
AST: 25 U/L (ref 15–41)
Albumin: 4.1 g/dL (ref 3.5–5.0)
Alkaline Phosphatase: 56 U/L (ref 38–126)
Anion gap: 7 (ref 5–15)
BUN: 16 mg/dL (ref 8–23)
CO2: 24 mmol/L (ref 22–32)
Calcium: 9 mg/dL (ref 8.9–10.3)
Chloride: 108 mmol/L (ref 98–111)
Creatinine, Ser: 0.85 mg/dL (ref 0.44–1.00)
GFR, Estimated: >60 mL/min (ref >60)
Glucose, Bld: 96 mg/dL (ref 70–99)
Potassium: 3.7 mmol/L (ref 3.5–5.1)
Sodium: 139 mmol/L (ref 135–145)
Total Bilirubin: 0.6 mg/dL (ref 0.3–1.2)
Total Protein: 7.2 g/dL (ref 6.5–8.1)

## 2021-08-26 NOTE — Assessment & Plan Note (Addendum)
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  CT scan FEB 2023 [PSBO]-negative for any lymphadenopathy- STABLE  Labs today within normal limits. ? 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 STABLE.  ? ?# Disposition:  ?# follow-up 6 m-MD /lab-CBC CMP [facility]- Dr.B ?

## 2021-08-26 NOTE — Progress Notes (Signed)
[place] ?OFFICE PROGRESS NOTE ? ?Patient Care Team: ?Marinda Elk, MD as PCP - General (Physician Assistant) ?Cammie Sickle, MD as Consulting Physician (Hematology and Oncology) ? ? Cancer Staging  ?No matching staging information was found for the patient.  ? ? ?Oncology History Overview Note  ?  ?1.FEB 2013-  MESENTERIC LYMPHADENOPATHY;] Pet positive lymphadenopathy.; . 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 ?3. Rituxan weekly x4 finished in March of 2013 ?4. PET scan in July of 2013 complete remission. ?5. Maintenance Rituxan started in July of 2013;  x 2 years [finished 2015] ? ?#April 2019-melanoma/left side of the nose [2020]/ Left arm [2021][Dr. Kowalski/Dr.Dasher] ? ? ?# LFTs- slightly up/ ? faty liver. ? ?# Barrets/Colo- q 2years [[facility]] ? ? ?6.Abnormal mammogram in August of 2015 biopsy has been reported to be negative for malignanc ?  ?Lymphoma, non-Hodgkin's ([place])  ?06/02/2014 Initial Diagnosis  ? Lymphoma, non-Hodgkin's ? ?  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 ?  ?INTERVAL HISTORY: Alone.  Ambulating independently. ? ?81 year old patient above history for follow-up of lymphoma status post Rituxan maintenance was completed in 2015 is here for follow-up.. ? ?In the interim patient denies any new lymph nodes.  Denies any night sweats.  Denies any fevers or chills. ? ? In Feb 2023- Partial small bowel obstruction- In the ED, it was found that they had partial small bowel obstruction on abdominal CT. Awaiting evaluation with Dr.Wohl.  ? ?REVIEW OF SYSTEMS:  ?Review of Systems  ?Constitutional: Negative.  Negative for chills, fever, malaise/fatigue and weight loss.  ?HENT:  Negative for congestion, ear pain and tinnitus.   ?Eyes: Negative.  Negative for blurred vision and double vision.  ?Respiratory: Negative.  Negative for cough, sputum production and shortness of breath.   ?Cardiovascular: Negative.  Negative for chest pain,  palpitations and leg swelling.  ?Gastrointestinal: Negative.  Negative for abdominal pain, constipation, diarrhea, nausea and vomiting.  ?Musculoskeletal:  Positive for back pain and joint pain. Negative for falls.  ?Neurological: Negative.  Negative for weakness and headaches.  ?Endo/Heme/Allergies: Negative.  Does not bruise/bleed easily.  ?Psychiatric/Behavioral: Negative.  Negative for depression. The patient is not nervous/anxious and does not have insomnia.   ?PAST MEDICAL HISTORY :  ?Past Medical History:  ?Diagnosis Date  ? Allergic rhinitis   ? Allergy   ? seasonal  ? Arthritis   ? degenerative of hip right  ? Barrett esophagus   ? Barrett esophagus   ? Benign essential tremor   ? Chronic bronchitis ([place])   ? GERD (gastroesophageal reflux disease)   ? History of colonic polyps   ? HSV (herpes simplex virus) infection   ? of upper lip  ? Hyperlipidemia   ? Hypertension   ? Melanoma ([place])   ? Migraine   ? 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 [facility]) 09/13/2014  ? Non Hodgkin's lymphoma ([place])   ? Non Hodgkin's lymphoma ([place])   ? RAD (reactive airway disease)   ? Skin cancer   ? ? ?PAST SURGICAL HISTORY :   ?Past Surgical History:  ?Procedure Laterality Date  ? ABDOMINAL HYSTERECTOMY    ? APPENDECTOMY    ? BASAL CELL CARCINOMA EXCISION    ? on both sides of nose  ? BREAST BIOPSY Left 12/2013  ? hyperplasia  ? CHOLECYSTECTOMY    ? COLONOSCOPY WITH PROPOFOL N/A 06/05/2017  ? Procedure: COLONOSCOPY WITH PROPOFOL;  Surgeon: Lollie Sails, MD;  Location:  [facility] ENDOSCOPY;  Service: Endoscopy;  Laterality: N/A;  ? COLONOSCOPY WITH PROPOFOL N/A 05/14/2020  ? Procedure: COLONOSCOPY WITH PROPOFOL;  Surgeon: Lesly Rubenstein, MD;  Location: [facility] ENDOSCOPY;  Service: Endoscopy;  Laterality: N/A;  ? ESOPHAGOGASTRODUODENOSCOPY (EGD) WITH PROPOFOL N/A 03/30/2015  ? Procedure: ESOPHAGOGASTRODUODENOSCOPY (EGD) WITH PROPOFOL;  Surgeon: Lollie Sails, MD;  Location: [facility] ENDOSCOPY;  Service: Endoscopy;   Laterality: N/A;  ? ESOPHAGOGASTRODUODENOSCOPY (EGD) WITH PROPOFOL N/A 06/05/2017  ? Procedure: ESOPHAGOGASTRODUODENOSCOPY (EGD) WITH PROPOFOL;  Surgeon: Lollie Sails, MD;  Location: [facility] ENDOSCOPY;  Service: Endoscopy;  Laterality: N/A;  ? ESOPHAGOGASTRODUODENOSCOPY (EGD) WITH PROPOFOL N/A 05/14/2020  ? Procedure: ESOPHAGOGASTRODUODENOSCOPY (EGD) WITH PROPOFOL;  Surgeon: Lesly Rubenstein, MD;  Location: [facility] ENDOSCOPY;  Service: Endoscopy;  Laterality: N/A;  ? FACIAL COSMETIC SURGERY    ? history of multiple colonic polyps    ? lymph node removal from left groin    ? OOPHORECTOMY    ? VENTRAL HERNIA REPAIR N/A 10/02/2014  ? Procedure: HERNIA REPAIR VENTRAL ADULT;  Surgeon: Leonie Green, MD;  Location: [facility] ORS;  Service: General;  Laterality: N/A;  with mesh  ? ? ?FAMILY HISTORY :   ?Family History  ?Problem Relation Age of Onset  ? Alcohol abuse Father   ? Stroke Father   ? Diabetes Sister   ? Kidney cancer Sister   ? Melanoma Brother   ? Diabetes Brother   ? Heart attack Brother   ? Breast cancer Neg Hx   ? Bladder Cancer Neg Hx   ? Prostate cancer Neg Hx   ? ? ?SOCIAL HISTORY:   ?Social History  ? ?Tobacco Use  ? Smoking status: Never  ? Smokeless tobacco: Never  ?Vaping Use  ? Vaping Use: Never used  ?Substance Use Topics  ? Alcohol use: Not Currently  ?  Alcohol/week: 1.0 standard drink  ?  Types: 1 Cans of beer per week  ?  Comment: rare social occasions only  ? Drug use: Never  ? ? ?ALLERGIES:  is allergic to buprenorphine hcl, dilaudid [hydromorphone hcl], and morphine and related. ? ?MEDICATIONS:  ?Current Outpatient Medications  ?Medication Sig Dispense Refill  ? lisinopril (PRINIVIL,ZESTRIL) 10 MG tablet Take 10 mg by mouth daily.    ? loratadine (CLARITIN) 10 MG tablet Take 10 mg by mouth daily.    ? omeprazole (PRILOSEC) 20 MG capsule Take 20 mg by mouth daily.    ? Probiotic Product (PROBIOTIC-10) CHEW Chew by mouth.    ? rosuvastatin (CRESTOR) 5 MG tablet Take 5 mg by mouth daily.     ? ?No current facility-administered medications for this visit.  ? ? ?PHYSICAL EXAMINATION: ?ECOG PERFORMANCE STATUS: 0 - Asymptomatic ? ?BP (!) 133/91 (BP Location: Left Arm, Patient Position: Sitting, Cuff Size: Normal)   Pulse 74   Temp (!) 97.1 ?F (36.2 ?C) (Tympanic)   Ht '5\' 2"'$  (1.575 m)   Wt 171 lb 6.4 oz (77.7 kg)   SpO2 99%   BMI 31.35 kg/m?  ? ?Filed Weights  ? 08/26/21 1009  ?Weight: 171 lb 6.4 oz (77.7 kg)  ? ?Physical Exam ?Constitutional:   ?   Comments: Obese.  Walk independently.  ?HENT:  ?   Head: Normocephalic and atraumatic.  ?   Mouth/Throat:  ?   Pharynx: No oropharyngeal exudate.  ?Eyes:  ?   Pupils: Pupils are equal, round, and reactive to light.  ?Cardiovascular:  ?   Rate and Rhythm: Normal rate and regular rhythm.  ?Pulmonary:  ?  Effort: Pulmonary effort is normal. No respiratory distress.  ?   Breath sounds: Normal breath sounds. No wheezing.  ?Abdominal:  ?   General: Bowel sounds are normal. There is no distension.  ?   Palpations: Abdomen is soft. There is no mass.  ?   Tenderness: There is no abdominal tenderness. There is no guarding or rebound.  ?Musculoskeletal:     ?   General: No tenderness. Normal range of motion.  ?   Cervical back: Normal range of motion and neck supple.  ?Skin: ?   General: Skin is warm.  ?Neurological:  ?   Mental Status: She is alert and oriented to person, place, and time.  ?Psychiatric:     ?   Mood and Affect: Affect normal.  ? ?LABORATORY DATA:  ?I have reviewed the data as listed ?   ?Component Value Date/Time  ? NA 139 08/26/2021 0956  ? NA 141 03/03/2014 1022  ? K 3.7 08/26/2021 0956  ? K 4.1 03/03/2014 1022  ? CL 108 08/26/2021 0956  ? CL 105 03/03/2014 1022  ? CO2 24 08/26/2021 0956  ? CO2 28 03/03/2014 1022  ? GLUCOSE 96 08/26/2021 0956  ? GLUCOSE 112 (H) 03/03/2014 1022  ? BUN 16 08/26/2021 0956  ? BUN 18 03/03/2014 1022  ? CREATININE 0.85 08/26/2021 0956  ? CREATININE 0.94 03/03/2014 1022  ? CALCIUM 9.0 08/26/2021 0956  ? CALCIUM 9.4  03/03/2014 1022  ? PROT 7.2 08/26/2021 0956  ? PROT 7.4 03/03/2014 1022  ? ALBUMIN 4.1 08/26/2021 0956  ? ALBUMIN 4.1 03/03/2014 1022  ? AST 25 08/26/2021 0956  ? AST 29 03/03/2014 1022  ? ALT 19 08/26/2021 0956  ? ALT 5

## 2021-09-07 ENCOUNTER — Encounter: Payer: Self-pay | Admitting: Gastroenterology

## 2021-09-07 ENCOUNTER — Ambulatory Visit (INDEPENDENT_AMBULATORY_CARE_PROVIDER_SITE_OTHER): Payer: Medicare Other | Admitting: Gastroenterology

## 2021-09-07 VITALS — BP 153/90 | HR 67 | Temp 97.7°F | Ht 62.0 in | Wt 172.0 lb

## 2021-09-07 DIAGNOSIS — Z86010 Personal history of colonic polyps: Secondary | ICD-10-CM

## 2021-09-07 MED ORDER — NA SULFATE-K SULFATE-MG SULF 17.5-3.13-1.6 GM/177ML PO SOLN
1.0000 | Freq: Once | ORAL | 0 refills | Status: AC
Start: 2021-09-07 — End: 2021-09-07

## 2021-09-07 NOTE — H&P (View-Only) (Signed)
? ? ?Gastroenterology Consultation ? ?Referring Provider:     Marinda Elk, MD ?Primary Care Physician:  Marinda Elk, MD ?Primary Gastroenterologist:  Dr. Allen Norris     ?Reason for Consultation:     History of polyps ?      ? HPI:   ?[name] is a 81 y.o. y/o female referred for consultation & management of history of polyps by Dr. Carrie Mew, Wilhemena Durie, MD. This patient comes in with a history of colon polyps, GERD and Barrett's esophagus.  The patient had a colonoscopy by Dr. Haig Prophet in 2022 showing: ? ?Impression: ?- Preparation of the colon was poor. ?- Stool in the ascending colon. ?- Non-bleeding hemorrhoids. ?- The examination was otherwise normal on direct and retroflexion views. ?- No specimens collected. ? ?The patient also had an EGD at that time that was done for the Barrett's esophagus and biopsies were consistent with short segment Barrett's esophagus.  There was a thickened fold in the stomach that Dr. Haig Prophet was concerned about and reported that he would discuss it with the clinician to do endoscopic ultrasounds at our facility.  After the colonoscopy with a poor prep the patient was followed up by the nurse practitioner at [facility] and the recommendations stated "Personal history of adenomatous polyps. Given the # of polyps in past, cancer history, and recent partial SBO do think it is worthwhile to repeat." ?It appears that in February of this year the patient had another bout of suspected small bowel obstruction and was seen by surgery who then recommended coming to see me for follow-up. ?The patient denies any abdominal pain nausea vomiting fevers chills black stools or bloody stools but is concerned because of her previous colonoscopy showing multiple polyps and her not being cleaned out for this most recent colonoscopy. ? ?Past Medical History:  ?Diagnosis Date  ? Allergic rhinitis   ? Allergy   ? seasonal  ? Arthritis   ? degenerative of hip right  ? Barrett esophagus   ?  Barrett esophagus   ? Benign essential tremor   ? Chronic bronchitis ([place])   ? GERD (gastroesophageal reflux disease)   ? History of colonic polyps   ? HSV (herpes simplex virus) infection   ? of upper lip  ? Hyperlipidemia   ? Hypertension   ? Melanoma ([place])   ? Migraine   ? 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 [facility]) 09/13/2014  ? Non Hodgkin's lymphoma ([place])   ? Non Hodgkin's lymphoma ([place])   ? RAD (reactive airway disease)   ? Skin cancer   ? ? ?Past Surgical History:  ?Procedure Laterality Date  ? ABDOMINAL HYSTERECTOMY    ? APPENDECTOMY    ? BASAL CELL CARCINOMA EXCISION    ? on both sides of nose  ? BREAST BIOPSY Left 12/2013  ? hyperplasia  ? CHOLECYSTECTOMY    ? COLONOSCOPY WITH PROPOFOL N/A 06/05/2017  ? Procedure: COLONOSCOPY WITH PROPOFOL;  Surgeon: Lollie Sails, MD;  Location: [facility] ENDOSCOPY;  Service: Endoscopy;  Laterality: N/A;  ? COLONOSCOPY WITH PROPOFOL N/A 05/14/2020  ? Procedure: COLONOSCOPY WITH PROPOFOL;  Surgeon: Lesly Rubenstein, MD;  Location: [facility] ENDOSCOPY;  Service: Endoscopy;  Laterality: N/A;  ? ESOPHAGOGASTRODUODENOSCOPY (EGD) WITH PROPOFOL N/A 03/30/2015  ? Procedure: ESOPHAGOGASTRODUODENOSCOPY (EGD) WITH PROPOFOL;  Surgeon: Lollie Sails, MD;  Location: [facility] ENDOSCOPY;  Service: Endoscopy;  Laterality: N/A;  ? ESOPHAGOGASTRODUODENOSCOPY (EGD) WITH PROPOFOL N/A 06/05/2017  ? Procedure: ESOPHAGOGASTRODUODENOSCOPY (EGD) WITH PROPOFOL;  Surgeon: Lollie Sails, MD;  Location: [facility] ENDOSCOPY;  Service: Endoscopy;  Laterality: N/A;  ? ESOPHAGOGASTRODUODENOSCOPY (EGD) WITH PROPOFOL N/A 05/14/2020  ? Procedure: ESOPHAGOGASTRODUODENOSCOPY (EGD) WITH PROPOFOL;  Surgeon: Lesly Rubenstein, MD;  Location: [facility] ENDOSCOPY;  Service: Endoscopy;  Laterality: N/A;  ? FACIAL COSMETIC SURGERY    ? history of multiple colonic polyps    ? lymph node removal from left groin    ? OOPHORECTOMY    ? VENTRAL HERNIA REPAIR N/A 10/02/2014  ? Procedure: HERNIA REPAIR VENTRAL ADULT;   Surgeon: Leonie Green, MD;  Location: [facility] ORS;  Service: General;  Laterality: N/A;  with mesh  ? ? ?Prior to Admission medications   ?Medication Sig Start Date End Date Taking? Authorizing Provider  ?lisinopril (PRINIVIL,ZESTRIL) 10 MG tablet Take 10 mg by mouth daily. 09/21/16   [provider]  ?loratadine (CLARITIN) 10 MG tablet Take 10 mg by mouth daily.    [provider]  ?omeprazole (PRILOSEC) 20 MG capsule Take 20 mg by mouth daily. 06/08/15   [provider]  ?Probiotic Product (PROBIOTIC-10) CHEW Chew by mouth.    [provider]  ?rosuvastatin (CRESTOR) 5 MG tablet Take 5 mg by mouth daily. 08/12/20 08/12/21  [provider]  ? ? ?Family History  ?Problem Relation Age of Onset  ? Alcohol abuse Father   ? Stroke Father   ? Diabetes Sister   ? Kidney cancer Sister   ? Melanoma Brother   ? Diabetes Brother   ? Heart attack Brother   ? Breast cancer Neg Hx   ? Bladder Cancer Neg Hx   ? Prostate cancer Neg Hx   ?  ? ?Social History  ? ?Tobacco Use  ? Smoking status: Never  ? Smokeless tobacco: Never  ?Vaping Use  ? Vaping Use: Never used  ?Substance Use Topics  ? Alcohol use: Not Currently  ?  Alcohol/week: 1.0 standard drink  ?  Types: 1 Cans of beer per week  ?  Comment: rare social occasions only  ? Drug use: Never  ? ? ?Allergies as of 09/07/2021 - Review Complete 08/26/2021  ?Allergen Reaction Noted  ? Buprenorphine hcl Nausea And Vomiting 03/10/2015  ? Dilaudid [hydromorphone hcl] Anxiety and Other (See Comments) 06/21/2021  ? Morphine and related Nausea And Vomiting 09/24/2014  ? ? ?Review of Systems:    ?All systems reviewed and negative except where noted in HPI. ? ? Physical Exam:  ?There were no vitals taken for this visit. ?No LMP recorded. Patient has had a hysterectomy. ?General:   Alert,  Well-developed, well-nourished, pleasant and cooperative in NAD ?Head:  Normocephalic and atraumatic. ?Eyes:  Sclera clear, no icterus.   Conjunctiva pink. ?Ears:   Normal auditory acuity. ?Neck:  Supple; no masses or thyromegaly. ?Lungs:  Respirations even and unlabored.  Clear throughout to auscultation.   No wheezes, crackles, or rhonchi. No acute distress. ?Heart:  Regular rate and rhythm; no murmurs, clicks, rubs, or gallops. ?Abdomen:  Normal bowel sounds.  No bruits.  Soft, non-tender and non-distended without masses, hepatosplenomegaly or hernias noted.  No guarding or rebound tenderness.  Negative Carnett sign.   ?Rectal:  Deferred.  ?Pulses:  Normal pulses noted. ?Extremities:  No clubbing or edema.  No cyanosis. ?Neurologic:  Alert and oriented x3;  grossly normal neurologically. ?Skin:  Intact without significant lesions or rashes.  No jaundice. ?Lymph Nodes:  No significant cervical adenopathy. ?Psych:  Alert and cooperative. Normal mood and affect. ? ?Imaging Studies: ?No  results found. ? ?Assessment and Plan:  ? ?[name] is a 81 y.o. y/o female who comes in today with a history of multiple polyps in the past and would like to change gastrologist.  The patient is concerned because for history of polyps and her Poor prepped colon at her last attempted colonoscopy by Dr. Haig Prophet.  The patient has been told the risks and benefits then because she is in good health and doing well at the present time she has decided to undergo a repeat colonoscopy.  The patient will be set up for a colonoscopy.  The patient has been explained the plan and agrees with it. ? ? ? ?Lucilla Lame, MD. Marval Regal ? ? ? Note: This dictation was prepared with Dragon dictation along with smaller phrase technology. Any transcriptional errors that result from this process are unintentional.   ?

## 2021-09-07 NOTE — Progress Notes (Signed)
? ? ?Gastroenterology Consultation ? ?Referring Provider:     Marinda Elk, MD ?Primary Care Physician:  Marinda Elk, MD ?Primary Gastroenterologist:  Dr. Allen Norris     ?Reason for Consultation:     History of polyps ?      ? HPI:   ?[name] is a 81 y.o. y/o female referred for consultation & management of history of polyps by Dr. Carrie Mew, Wilhemena Durie, MD. This patient comes in with a history of colon polyps, GERD and Barrett's esophagus.  The patient had a colonoscopy by Dr. Haig Prophet in 2022 showing: ? ?Impression: ?- Preparation of the colon was poor. ?- Stool in the ascending colon. ?- Non-bleeding hemorrhoids. ?- The examination was otherwise normal on direct and retroflexion views. ?- No specimens collected. ? ?The patient also had an EGD at that time that was done for the Barrett's esophagus and biopsies were consistent with short segment Barrett's esophagus.  There was a thickened fold in the stomach that Dr. Haig Prophet was concerned about and reported that he would discuss it with the clinician to do endoscopic ultrasounds at our facility.  After the colonoscopy with a poor prep the patient was followed up by the nurse practitioner at [facility] and the recommendations stated "Personal history of adenomatous polyps. Given the # of polyps in past, cancer history, and recent partial SBO do think it is worthwhile to repeat." ?It appears that in February of this year the patient had another bout of suspected small bowel obstruction and was seen by surgery who then recommended coming to see me for follow-up. ?The patient denies any abdominal pain nausea vomiting fevers chills black stools or bloody stools but is concerned because of her previous colonoscopy showing multiple polyps and her not being cleaned out for this most recent colonoscopy. ? ?Past Medical History:  ?Diagnosis Date  ? Allergic rhinitis   ? Allergy   ? seasonal  ? Arthritis   ? degenerative of hip right  ? Barrett esophagus   ?  Barrett esophagus   ? Benign essential tremor   ? Chronic bronchitis ([place])   ? GERD (gastroesophageal reflux disease)   ? History of colonic polyps   ? HSV (herpes simplex virus) infection   ? of upper lip  ? Hyperlipidemia   ? Hypertension   ? Melanoma ([place])   ? Migraine   ? 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 [facility]) 09/13/2014  ? Non Hodgkin's lymphoma ([place])   ? Non Hodgkin's lymphoma ([place])   ? RAD (reactive airway disease)   ? Skin cancer   ? ? ?Past Surgical History:  ?Procedure Laterality Date  ? ABDOMINAL HYSTERECTOMY    ? APPENDECTOMY    ? BASAL CELL CARCINOMA EXCISION    ? on both sides of nose  ? BREAST BIOPSY Left 12/2013  ? hyperplasia  ? CHOLECYSTECTOMY    ? COLONOSCOPY WITH PROPOFOL N/A 06/05/2017  ? Procedure: COLONOSCOPY WITH PROPOFOL;  Surgeon: Lollie Sails, MD;  Location: [facility] ENDOSCOPY;  Service: Endoscopy;  Laterality: N/A;  ? COLONOSCOPY WITH PROPOFOL N/A 05/14/2020  ? Procedure: COLONOSCOPY WITH PROPOFOL;  Surgeon: Lesly Rubenstein, MD;  Location: [facility] ENDOSCOPY;  Service: Endoscopy;  Laterality: N/A;  ? ESOPHAGOGASTRODUODENOSCOPY (EGD) WITH PROPOFOL N/A 03/30/2015  ? Procedure: ESOPHAGOGASTRODUODENOSCOPY (EGD) WITH PROPOFOL;  Surgeon: Lollie Sails, MD;  Location: [facility] ENDOSCOPY;  Service: Endoscopy;  Laterality: N/A;  ? ESOPHAGOGASTRODUODENOSCOPY (EGD) WITH PROPOFOL N/A 06/05/2017  ? Procedure: ESOPHAGOGASTRODUODENOSCOPY (EGD) WITH PROPOFOL;  Surgeon: Lollie Sails, MD;  Location[facility] ENDOSCOPY;  Service: Endoscopy;  Laterality: N/A;  ? ESOPHAGOGASTRODUODENOSCOPY (EGD) WITH PROPOFOL N/A 05/14/2020  ? Procedure: ESOPHAGOGASTRODUODENOSCOPY (EGD) WITH PROPOFOL;  Surgeon: Lesly Rubenstein, MD;  Location: [facility] ENDOSCOPY;  Service: Endoscopy;  Laterality: N/A;  ? FACIAL COSMETIC SURGERY    ? history of multiple colonic polyps    ? lymph node removal from left groin    ? OOPHORECTOMY    ? VENTRAL HERNIA REPAIR N/A 10/02/2014  ? Procedure: HERNIA REPAIR VENTRAL ADULT;   Surgeon: Leonie Green, MD;  Location: [facility] ORS;  Service: General;  Laterality: N/A;  with mesh  ? ? ?Prior to Admission medications   ?Medication Sig Start Date End Date Taking? Authorizing Provider  ?lisinopril (PRINIVIL,ZESTRIL) 10 MG tablet Take 10 mg by mouth daily. 09/21/16   [provider]  ?loratadine (CLARITIN) 10 MG tablet Take 10 mg by mouth daily.    [provider]  ?omeprazole (PRILOSEC) 20 MG capsule Take 20 mg by mouth daily. 06/08/15   [provider]  ?Probiotic Product (PROBIOTIC-10) CHEW Chew by mouth.    [provider]  ?rosuvastatin (CRESTOR) 5 MG tablet Take 5 mg by mouth daily. 08/12/20 08/12/21  [provider]  ? ? ?Family History  ?Problem Relation Age of Onset  ? Alcohol abuse Father   ? Stroke Father   ? Diabetes Sister   ? Kidney cancer Sister   ? Melanoma Brother   ? Diabetes Brother   ? Heart attack Brother   ? Breast cancer Neg Hx   ? Bladder Cancer Neg Hx   ? Prostate cancer Neg Hx   ?  ? ?Social History  ? ?Tobacco Use  ? Smoking status: Never  ? Smokeless tobacco: Never  ?Vaping Use  ? Vaping Use: Never used  ?Substance Use Topics  ? Alcohol use: Not Currently  ?  Alcohol/week: 1.0 standard drink  ?  Types: 1 Cans of beer per week  ?  Comment: rare social occasions only  ? Drug use: Never  ? ? ?Allergies as of 09/07/2021 - Review Complete 08/26/2021  ?Allergen Reaction Noted  ? Buprenorphine hcl Nausea And Vomiting 03/10/2015  ? Dilaudid [hydromorphone hcl] Anxiety and Other (See Comments) 06/21/2021  ? Morphine and related Nausea And Vomiting 09/24/2014  ? ? ?Review of Systems:    ?All systems reviewed and negative except where noted in HPI. ? ? Physical Exam:  ?There were no vitals taken for this visit. ?No LMP recorded. Patient has had a hysterectomy. ?General:   Alert,  Well-developed, well-nourished, pleasant and cooperative in NAD ?Head:  Normocephalic and atraumatic. ?Eyes:  Sclera clear, no icterus.   Conjunctiva pink. ?Ears:   Normal auditory acuity. ?Neck:  Supple; no masses or thyromegaly. ?Lungs:  Respirations even and unlabored.  Clear throughout to auscultation.   No wheezes, crackles, or rhonchi. No acute distress. ?Heart:  Regular rate and rhythm; no murmurs, clicks, rubs, or gallops. ?Abdomen:  Normal bowel sounds.  No bruits.  Soft, non-tender and non-distended without masses, hepatosplenomegaly or hernias noted.  No guarding or rebound tenderness.  Negative Carnett sign.   ?Rectal:  Deferred.  ?Pulses:  Normal pulses noted. ?Extremities:  No clubbing or edema.  No cyanosis. ?Neurologic:  Alert and oriented x3;  grossly normal neurologically. ?Skin:  Intact without significant lesions or rashes.  No jaundice. ?Lymph Nodes:  No significant cervical adenopathy. ?Psych:  Alert and cooperative. Normal mood and affect. ? ?Imaging Studies: ?No  results found. ? ?Assessment and Plan:  ? ?[name] is a 81 y.o. y/o female who comes in today with a history of multiple polyps in the past and would like to change gastrologist.  The patient is concerned because for history of polyps and her Poor prepped colon at her last attempted colonoscopy by Dr. Haig Prophet.  The patient has been told the risks and benefits then because she is in good health and doing well at the present time she has decided to undergo a repeat colonoscopy.  The patient will be set up for a colonoscopy.  The patient has been explained the plan and agrees with it. ? ? ? ?Lucilla Lame, MD. Marval Regal ? ? ? Note: This dictation was prepared with Dragon dictation along with smaller phrase technology. Any transcriptional errors that result from this process are unintentional.   ?

## 2021-09-09 ENCOUNTER — Telehealth: Payer: Self-pay | Admitting: Gastroenterology

## 2021-09-09 MED ORDER — CLENPIQ 10-3.5-12 MG-GM -GM/160ML PO SOLN: 1.0000 | Freq: Once | ORAL | 0 refills | Status: AC

## 2021-09-09 NOTE — Telephone Encounter (Signed)
Pt reports she is not able to tolerate the large amount of liquid with the [place]... Clenpiq is not covered.... Sample of Clenpiq with instructions placed in the front office in [place] for pt to pick up ?

## 2021-09-09 NOTE — Telephone Encounter (Signed)
Pt is requesting a prep cheaper than 94.00  she states that she cannot  afford it. ?

## 2021-09-09 NOTE — Addendum Note (Signed)
Addended by: Lurlean Nanny on: 09/09/2021 10:54 AM ? ? Modules accepted: Orders ? ?

## 2021-09-16 ENCOUNTER — Ambulatory Visit: Payer: Medicare Other | Admitting: Anesthesiology

## 2021-09-16 ENCOUNTER — Encounter
Admission: RE | Disposition: A | Discharge status: Home / Self Care | Payer: Self-pay | Source: Home / Self Care | Attending: Gastroenterology

## 2021-09-16 ENCOUNTER — Ambulatory Visit
Admission: RE | Admit: 2021-09-16 | Discharge: 2021-09-16 | Disposition: A | Discharge status: Home / Self Care | Payer: Medicare Other | Attending: Gastroenterology | Admitting: Gastroenterology

## 2021-09-16 ENCOUNTER — Encounter: Payer: Self-pay | Admitting: Gastroenterology

## 2021-09-16 ENCOUNTER — Other Ambulatory Visit: Payer: Self-pay

## 2021-09-16 DIAGNOSIS — Z1211 Encounter for screening for malignant neoplasm of colon: Secondary | ICD-10-CM | POA: Insufficient documentation

## 2021-09-16 DIAGNOSIS — K641 Second degree hemorrhoids: Secondary | ICD-10-CM | POA: Insufficient documentation

## 2021-09-16 DIAGNOSIS — K635 Polyp of colon: Secondary | ICD-10-CM | POA: Diagnosis not present

## 2021-09-16 DIAGNOSIS — K219 Gastro-esophageal reflux disease without esophagitis: Secondary | ICD-10-CM | POA: Diagnosis not present

## 2021-09-16 DIAGNOSIS — I10 Essential (primary) hypertension: Secondary | ICD-10-CM | POA: Diagnosis not present

## 2021-09-16 DIAGNOSIS — Z860100 Personal history of colon polyps, unspecified: Secondary | ICD-10-CM

## 2021-09-16 DIAGNOSIS — D124 Benign neoplasm of descending colon: Secondary | ICD-10-CM | POA: Diagnosis not present

## 2021-09-16 HISTORY — PX: COLONOSCOPY: SHX5424

## 2021-09-16 SURGERY — COLONOSCOPY
Anesthesia: General | Site: Rectum

## 2021-09-16 MED ORDER — OXYCODONE HCL 5 MG PO TABS: 5.0000 mg | ORAL_TABLET | Freq: Once | ORAL | Status: DC | PRN

## 2021-09-16 MED ORDER — STERILE WATER FOR IRRIGATION IR SOLN
Status: DC | PRN
  Administered 2021-09-16: 120 mL

## 2021-09-16 MED ORDER — PROPOFOL 10 MG/ML IV BOLUS
INTRAVENOUS | Status: DC | PRN
  Administered 2021-09-16 (×3): 20 mg via INTRAVENOUS
  Administered 2021-09-16: 100 mg via INTRAVENOUS
  Administered 2021-09-16 (×3): 20 mg via INTRAVENOUS

## 2021-09-16 MED ORDER — LACTATED RINGERS IV SOLN: INTRAVENOUS | Status: DC

## 2021-09-16 MED ORDER — OXYCODONE HCL 5 MG/5ML PO SOLN: 5.0000 mg | Freq: Once | ORAL | Status: DC | PRN

## 2021-09-16 MED ORDER — STERILE WATER FOR IRRIGATION IR SOLN
Status: DC | PRN
  Administered 2021-09-16: 150 mL

## 2021-09-16 MED ORDER — SODIUM CHLORIDE 0.9 % IV SOLN: INTRAVENOUS | Status: DC

## 2021-09-16 MED ORDER — LIDOCAINE HCL (CARDIAC) PF 100 MG/5ML IV SOSY
PREFILLED_SYRINGE | INTRAVENOUS | Status: DC | PRN
  Administered 2021-09-16: 30 mg via INTRAVENOUS

## 2021-09-16 MED ORDER — FENTANYL CITRATE PF 50 MCG/ML IJ SOSY: 25.0000 ug | PREFILLED_SYRINGE | INTRAMUSCULAR | Status: DC | PRN

## 2021-09-16 SURGICAL SUPPLY — 22 items

## 2021-09-16 NOTE — Anesthesia Postprocedure Evaluation (Signed)
Anesthesia Post Note ? ?Patient: [name] ? ?Procedure(s) Performed: COLONOSCOPY (Rectum) ? ? ?  ?Patient location during evaluation: PACU ?Anesthesia Type: General ?Level of consciousness: awake and alert ?Pain management: pain level controlled ?Vital Signs Assessment: post-procedure vital signs reviewed and stable ?Respiratory status: spontaneous breathing, nonlabored ventilation, respiratory function stable and patient connected to nasal cannula oxygen ?Cardiovascular status: blood pressure returned to baseline and stable ?Postop Assessment: no apparent nausea or vomiting ?Anesthetic complications: no ? ? ?No notable events documented. ? ?[name] ? ? ? ? ? ?

## 2021-09-16 NOTE — Transfer of Care (Signed)
Immediate Anesthesia Transfer of Care Note ? ?Patient: [name] ? ?Procedure(s) Performed: COLONOSCOPY (Rectum) ? ?Patient Location: PACU ? ?Anesthesia Type: General ? ?Level of Consciousness: awake, alert  and patient cooperative ? ?Airway and Oxygen Therapy: Patient Spontanous Breathing and Patient connected to supplemental oxygen ? ?Post-op Assessment: Post-op Vital signs reviewed, Patient's Cardiovascular Status Stable, Respiratory Function Stable, Patent Airway and No signs of Nausea or vomiting ? ?Post-op Vital Signs: Reviewed and stable ? ?Complications: No notable events documented. ? ?

## 2021-09-16 NOTE — Anesthesia Procedure Notes (Signed)
Date/Time: 09/16/2021 7:56 AM ?Performed by: Cameron Ali, CRNA ?Pre-anesthesia Checklist: Patient identified, Emergency Drugs available, Suction available, Timeout performed and Patient being monitored ?Patient Re-evaluated:Patient Re-evaluated prior to induction ?Oxygen Delivery Method: Nasal cannula ?Placement Confirmation: positive ETCO2 ? ? ? ? ?

## 2021-09-16 NOTE — Anesthesia Preprocedure Evaluation (Signed)
Anesthesia Evaluation  ?Patient identified by MRN, date of birth, ID band ?Patient awake ? ? ? ?Reviewed: ?Allergy & Precautions, H&P , NPO status , Patient's Chart, lab work & pertinent test results, reviewed documented beta blocker date and time  ? ?Airway ?Mallampati: II ? ?TM Distance: >3 FB ?Neck ROM: full ? ? ? Dental ?no notable dental hx. ? ?  ?Pulmonary ?neg pulmonary ROS,  ?  ?Pulmonary exam normal ?breath sounds clear to auscultation ? ? ? ? ? ? Cardiovascular ?Exercise Tolerance: Good ?hypertension, negative cardio ROS ? ? ?Rhythm:regular Rate:Normal ? ? ?  ?Neuro/Psych ?negative neurological ROS ? negative psych ROS  ? GI/Hepatic ?negative GI ROS, Neg liver ROS, GERD  ,  ?Endo/Other  ?negative endocrine ROS ? Renal/GU ?negative Renal ROS  ?negative genitourinary ?  ?Musculoskeletal ? ? Abdominal ?  ?Peds ? Hematology ?negative hematology ROS ?(+)   ?Anesthesia Other Findings ? ? Reproductive/Obstetrics ?negative OB ROS ? ?  ? ? ? ? ? ? ? ? ? ? ? ? ? ?  ?  ? ? ? ? ? ? ? ? ?Anesthesia Physical ?Anesthesia Plan ? ?ASA: 2 ? ?Anesthesia Plan: General  ? ?Post-op Pain Management:   ? ?Induction:  ? ?PONV Risk Score and Plan: 3 and Treatment may vary due to age or medical condition ? ?Airway Management Planned:  ? ?Additional Equipment:  ? ?Intra-op Plan:  ? ?Post-operative Plan:  ? ?Informed Consent: I have reviewed the patients History and Physical, chart, labs and discussed the procedure including the risks, benefits and alternatives for the proposed anesthesia with the patient or authorized representative who has indicated his/her understanding and acceptance.  ? ? ? ?Dental Advisory Given ? ?Plan Discussed with: CRNA ? ?Anesthesia Plan Comments:   ? ? ? ? ? ? ?Anesthesia Quick Evaluation ? ?

## 2021-09-16 NOTE — Interval H&P Note (Signed)
? ?Lucilla Lame, MD [facility] ?[address] ?[place], [place] [address] ?Phone:[phone] ?Fax : [phone] ? ?Primary Care Physician:  Marinda Elk, MD ?Primary Gastroenterologist:  Dr. Allen Norris ? ?Pre-Procedure History & Physical: ?HPI:  [name] is a 81 y.o. female is here for an colonoscopy. ?  ?Past Medical History:  ?Diagnosis Date  ? Allergic rhinitis   ? Allergy   ? seasonal  ? Arthritis   ? degenerative of hip right  ? Barrett esophagus   ? Barrett esophagus   ? Benign essential tremor   ? Chronic bronchitis ([place])   ? GERD (gastroesophageal reflux disease)   ? History of colonic polyps   ? HSV (herpes simplex virus) infection   ? of upper lip  ? Hyperlipidemia   ? Hypertension   ? Melanoma ([place])   ? Migraine   ? 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 Non Hodgkin's lymphoma ([place])   ? Non Hodgkin's lymphoma ([place])   ? RAD (reactive airway disease)   ? Skin cancer   ? ? ?Past Surgical History:  ?Procedure Laterality Date  ? ABDOMINAL HYSTERECTOMY    ? APPENDECTOMY    ? BASAL CELL CARCINOMA EXCISION    ? on both sides of nose  ? BREAST BIOPSY Left 12/2013  ? hyperplasia  ? CHOLECYSTECTOMY    ? COLONOSCOPY WITH PROPOFOL N/A 06/05/2017  ? Procedure: COLONOSCOPY WITH PROPOFOL;  Surgeon: Lollie Sails, MD;  Location: [facility] ENDOSCOPY;  Service: Endoscopy;  Laterality: N/A;  ? COLONOSCOPY WITH PROPOFOL N/A 05/14/2020  ? Procedure: COLONOSCOPY WITH PROPOFOL;  Surgeon: Lesly Rubenstein, MD;  Location: [facility] ENDOSCOPY;  Service: Endoscopy;  Laterality: N/A;  ? ESOPHAGOGASTRODUODENOSCOPY (EGD) WITH PROPOFOL N/A 03/30/2015  ? Procedure: ESOPHAGOGASTRODUODENOSCOPY (EGD) WITH PROPOFOL;  Surgeon: Lollie Sails, MD;  Location: [facility] ENDOSCOPY;  Service: Endoscopy;  Laterality: N/A;  ? ESOPHAGOGASTRODUODENOSCOPY (EGD) WITH PROPOFOL N/A 06/05/2017  ? Procedure: ESOPHAGOGASTRODUODENOSCOPY (EGD) WITH PROPOFOL;  Surgeon: Lollie Sails, MD;  Location: [facility] ENDOSCOPY;   Service: Endoscopy;  Laterality: N/A;  ? ESOPHAGOGASTRODUODENOSCOPY (EGD) WITH PROPOFOL N/A 05/14/2020  ? Procedure: ESOPHAGOGASTRODUODENOSCOPY (EGD) WITH PROPOFOL;  Surgeon: Lesly Rubenstein, MD;  Location: [facility] ENDOSCOPY;  Service: Endoscopy;  Laterality: N/A;  ? FACIAL COSMETIC SURGERY    ? history of multiple colonic polyps    ? lymph node removal from left groin    ? OOPHORECTOMY    ? VENTRAL HERNIA REPAIR N/A 10/02/2014  ? Procedure: HERNIA REPAIR VENTRAL ADULT;  Surgeon: Leonie Green, MD;  Location: [facility] ORS;  Service: General;  Laterality: N/A;  with mesh  ? ? ?Prior to Admission medications   ?Medication Sig Start Date End Date Taking? Authorizing Provider  ?lisinopril (PRINIVIL,ZESTRIL) 10 MG tablet Take 10 mg by mouth daily. 09/21/16  Yes [provider]  ?loratadine (CLARITIN) 10 MG tablet Take 10 mg by mouth daily.   Yes [provider]  ?omeprazole (PRILOSEC) 20 MG capsule Take 20 mg by mouth daily. 06/08/15  Yes [provider]  ?Probiotic Product (PROBIOTIC-10) CHEW Chew by mouth.   Yes [provider]  ?rosuvastatin (CRESTOR) 5 MG tablet Take 5 mg by mouth daily. 08/12/20 09/16/21 Yes [provider]  ? ? ?Allergies as of 09/07/2021 - Review Complete 09/07/2021  ?Allergen Reaction Noted  ? Buprenorphine hcl Nausea And Vomiting 03/10/2015  ? Dilaudid [hydromorphone hcl] Anxiety and Other (See Comments) 06/21/2021  ? Morphine and related Nausea And Vomiting 09/24/2014  ? ? ?  Family History  ?Problem Relation Age of Onset  ? Alcohol abuse Father   ? Stroke Father   ? Diabetes Sister   ? Kidney cancer Sister   ? Melanoma Brother   ? Diabetes Brother   ? Heart attack Brother   ? Breast cancer Neg Hx   ? Bladder Cancer Neg Hx   ? Prostate cancer Neg Hx   ? ? ?Social History  ? ?Socioeconomic History  ? Marital status: Married  ?  Spouse name: Not on file  ? Number of children: Not on file  ? Years of education: Not on file  ? Highest education level: Not on  file  ?Occupational History  ? Not on file  ?Tobacco Use  ? Smoking status: Never  ? Smokeless tobacco: Never  ?Vaping Use  ? Vaping Use: Never used  ?Substance and Sexual Activity  ? Alcohol use: Not Currently  ?  Alcohol/week: 1.0 standard drink  ?  Types: 1 Cans of beer per week  ?  Comment: rare social occasions only  ? Drug use: Never  ? Sexual activity: Never  ?Other Topics Concern  ? Not on file  ?Social History Narrative  ? Not on file  ? ?Social Determinants of Health  ? ?Financial Resource Strain: Not on file  ?Food Insecurity: Not on file  ?Transportation Needs: Not on file  ?Physical Activity: Not on file  ?Stress: Not on file  ?Social Connections: Not on file  ?Intimate Partner Violence: Not on file  ? ? ?Review of Systems: ?See HPI, otherwise negative ROS ? ?Physical Exam: ?BP (!) 156/82   Pulse 76   Temp 97.9 ?F (36.6 ?C)   Ht '5\' 2"'$  (1.575 m)   Wt 76.2 kg   SpO2 94%   BMI 30.73 kg/m?  ?General:   Alert,  pleasant and cooperative in NAD ?Head:  Normocephalic and atraumatic. ?Neck:  Supple; no masses or thyromegaly. ?Lungs:  Clear throughout to auscultation.    ?Heart:  Regular rate and rhythm. ?Abdomen:  Soft, nontender and nondistended. Normal bowel sounds, without guarding, and without rebound.   ?Neurologic:  Alert and  oriented x4;  grossly normal neurologically. ? ?Impression/Plan: ?[name] is here for an colonoscopy to be performed for a history of adenomatous polyps on 2019 ? ? ?Risks, benefits, limitations, and alternatives regarding  colonoscopy have been reviewed with the patient.  Questions have been answered.  All parties agreeable. ? ? ?Lucilla Lame, MD  09/16/2021, 7:11 AM ?

## 2021-09-16 NOTE — Op Note (Addendum)
[facility] ?Gastroenterology ?Patient Name: [name] ?Procedure Date: 09/16/2021 7:12 AM ?MRN: [id number] ?Account #: [id number] ?Date of Birth: 05/13/1940 ?Admit Type: Outpatient ?Age: 81 ?Room: [facility] OR ROOM 01 ?Gender: Female ?Note Status: Finalized ?Instrument Name: [id number] ?Procedure:             Colonoscopy ?Indications:           High risk colon cancer surveillance: Personal history  ?                       of colonic polyps ?Providers:             Lucilla Lame MD, MD ?Referring MD:          Precious Bard, MD (Referring MD) ?Medicines:             Propofol per Anesthesia ?Complications:         No immediate complications. ?Procedure:             Pre-Anesthesia Assessment: ?                       - Prior to the procedure, a History and Physical was  ?                       performed, and patient medications and allergies were  ?                       reviewed. The patient's tolerance of previous  ?                       anesthesia was also reviewed. The risks and benefits  ?                       of the procedure and the sedation options and risks  ?                       were discussed with the patient. All questions were  ?                       answered, and informed consent was obtained. Prior  ?                       Anticoagulants: The patient has taken no previous  ?                       anticoagulant or antiplatelet agents. ASA Grade  ?                       Assessment: II - A patient with mild systemic disease.  ?                       After reviewing the risks and benefits, the patient  ?                       was deemed in satisfactory condition to undergo the  ?                       procedure. ?  After obtaining informed consent, the colonoscope was  ?                       passed under direct vision. Throughout the procedure,  ?                       the patient's blood pressure, pulse, and oxygen  ?                       saturations were monitored  continuously. The  ?                       Colonoscope was introduced through the anus and  ?                       advanced to the the cecum, identified by appendiceal  ?                       orifice and ileocecal valve. The colonoscopy was  ?                       performed without difficulty. The patient tolerated  ?                       the procedure well. The quality of the bowel  ?                       preparation was excellent. ?Findings: ?     The perianal and digital rectal examinations were normal. ?     A 5 mm polyp was found in the descending colon. The polyp was sessile.  ?     The polyp was removed with a cold snare. Resection and retrieval were  ?     complete. ?     Non-bleeding internal hemorrhoids were found during retroflexion. The  ?     hemorrhoids were Grade II (internal hemorrhoids that prolapse but reduce  ?     spontaneously). ?Impression:            - One 5 mm polyp in the descending colon, removed with  ?                       a cold snare. Resected and retrieved. ?                       - Non-bleeding internal hemorrhoids. ?Recommendation:        - Discharge patient to home. ?                       - Resume previous diet. ?                       - Continue present medications. ?                       - Await pathology results. ?                       - Repeat colonoscopy is not recommended for  ?  surveillance. ?Procedure Code(s):     --- Professional --- ?                       [phone], Colonoscopy, flexible; with removal of  ?                       tumor(s), polyp(s), or other lesion(s) by snare  ?                       technique ?Diagnosis Code(s):     --- Professional --- ?                       Z86.010, Personal history of colonic polyps ?                       K63.5, Polyp of colon ?CPT copyright 2019 American Medical Association. All rights reserved. ?The codes documented in this report are preliminary and upon coder review may  ?be revised to meet current  compliance requirements. ?Lucilla Lame MD, MD ?09/16/2021 7:56:07 AM ?This report has been signed electronically. ?Number of Addenda: 0 ?Note Initiated On: 09/16/2021 7:12 AM ?Scope Withdrawal Time: 0 hours 8 minutes 12 seconds  ?Total Procedure Duration: 0 hours 13 minutes 11 seconds  ?Estimated Blood Loss:  Estimated blood loss: none. ?     [facility] ?

## 2021-09-19 LAB — SURGICAL PATHOLOGY

## 2021-09-21 ENCOUNTER — Encounter: Payer: Self-pay | Admitting: Gastroenterology

## 2021-12-28 ENCOUNTER — Encounter: Payer: Self-pay | Admitting: Ophthalmology

## 2022-01-04 NOTE — Discharge Instructions (Signed)

## 2022-01-10 ENCOUNTER — Encounter: Payer: Self-pay | Admitting: Ophthalmology

## 2022-01-10 ENCOUNTER — Encounter
Admission: RE | Disposition: A | Discharge status: Home / Self Care | Payer: Self-pay | Source: Home / Self Care | Attending: Ophthalmology

## 2022-01-10 ENCOUNTER — Ambulatory Visit (AMBULATORY_SURGERY_CENTER): Payer: Medicare Other | Admitting: Anesthesiology

## 2022-01-10 ENCOUNTER — Other Ambulatory Visit: Payer: Self-pay

## 2022-01-10 ENCOUNTER — Ambulatory Visit
Admission: RE | Admit: 2022-01-10 | Discharge: 2022-01-10 | Disposition: A | Discharge status: Home / Self Care | Payer: Medicare Other | Attending: Ophthalmology | Admitting: Ophthalmology

## 2022-01-10 ENCOUNTER — Ambulatory Visit: Payer: Medicare Other | Admitting: Anesthesiology

## 2022-01-10 DIAGNOSIS — H2511 Age-related nuclear cataract, right eye: Secondary | ICD-10-CM | POA: Insufficient documentation

## 2022-01-10 DIAGNOSIS — J449 Chronic obstructive pulmonary disease, unspecified: Secondary | ICD-10-CM | POA: Diagnosis not present

## 2022-01-10 DIAGNOSIS — I10 Essential (primary) hypertension: Secondary | ICD-10-CM | POA: Diagnosis not present

## 2022-01-10 DIAGNOSIS — Z87891 Personal history of nicotine dependence: Secondary | ICD-10-CM | POA: Diagnosis not present

## 2022-01-10 DIAGNOSIS — K219 Gastro-esophageal reflux disease without esophagitis: Secondary | ICD-10-CM | POA: Diagnosis not present

## 2022-01-10 HISTORY — PX: CATARACT EXTRACTION W/PHACO: SHX586

## 2022-01-10 SURGERY — PHACOEMULSIFICATION, CATARACT, WITH IOL INSERTION
Anesthesia: Monitor Anesthesia Care | Site: Eye | Laterality: Right

## 2022-01-10 MED ORDER — MIDAZOLAM HCL 2 MG/2ML IJ SOLN
INTRAMUSCULAR | Status: DC | PRN
  Administered 2022-01-10: 2 mg via INTRAVENOUS

## 2022-01-10 MED ORDER — SIGHTPATH DOSE#1 BSS IO SOLN
INTRAOCULAR | Status: DC | PRN
  Administered 2022-01-10: 60 mL via OPHTHALMIC

## 2022-01-10 MED ORDER — ARMC OPHTHALMIC DILATING DROPS
1.0000 | OPHTHALMIC | Status: DC | PRN
  Administered 2022-01-10 (×3): 1 via OPHTHALMIC

## 2022-01-10 MED ORDER — SIGHTPATH DOSE#1 BSS IO SOLN
INTRAOCULAR | Status: DC | PRN
  Administered 2022-01-10: 15 mL

## 2022-01-10 MED ORDER — TETRACAINE HCL 0.5 % OP SOLN
1.0000 [drp] | OPHTHALMIC | Status: DC | PRN
  Administered 2022-01-10 (×3): 1 [drp] via OPHTHALMIC

## 2022-01-10 MED ORDER — SIGHTPATH DOSE#1 NA CHONDROIT SULF-NA HYALURON 40-17 MG/ML IO SOLN
INTRAOCULAR | Status: DC | PRN
  Administered 2022-01-10: 1 mL via INTRAOCULAR

## 2022-01-10 MED ORDER — MOXIFLOXACIN HCL 0.5 % OP SOLN
OPHTHALMIC | Status: DC | PRN
  Administered 2022-01-10: 0.2 mL via OPHTHALMIC

## 2022-01-10 MED ORDER — SIGHTPATH DOSE#1 BSS IO SOLN
INTRAOCULAR | Status: DC | PRN
  Administered 2022-01-10: 1 mL

## 2022-01-10 MED ORDER — BRIMONIDINE TARTRATE-TIMOLOL 0.2-0.5 % OP SOLN
OPHTHALMIC | Status: DC | PRN
  Administered 2022-01-10: 1 [drp] via OPHTHALMIC

## 2022-01-10 MED ORDER — LACTATED RINGERS IV SOLN: INTRAVENOUS | Status: DC

## 2022-01-10 SURGICAL SUPPLY — 16 items
CANNULA ANT/CHMB 27G (MISCELLANEOUS) IMPLANT
CANNULA ANT/CHMB 27GA (MISCELLANEOUS) IMPLANT
CATARACT SUITE SIGHTPATH (MISCELLANEOUS) ×1 IMPLANT
FEE CATARACT SUITE SIGHTPATH (MISCELLANEOUS) ×1 IMPLANT
GLOVE SURG ENC TEXT LTX SZ8 (GLOVE) ×1 IMPLANT
GLOVE SURG TRIUMPH 8.0 PF LTX (GLOVE) ×1 IMPLANT
LENS IOL TECNIS EYHANCE 19.5 (Intraocular Lens) IMPLANT
NDL FILTER BLUNT 18X1 1/2 (NEEDLE) ×1 IMPLANT
NEEDLE FILTER BLUNT 18X 1/2SAF (NEEDLE) ×1
NEEDLE FILTER BLUNT 18X1 1/2 (NEEDLE) ×1 IMPLANT
PACK VIT ANT 23G (MISCELLANEOUS) IMPLANT
RING MALYGIN (MISCELLANEOUS) IMPLANT
SUT ETHILON 10-0 CS-B-6CS-B-6 (SUTURE)
SUTURE EHLN 10-0 CS-B-6CS-B-6 (SUTURE) IMPLANT
SYR 3ML LL SCALE MARK (SYRINGE) ×1 IMPLANT
WATER STERILE IRR 250ML POUR (IV SOLUTION) ×1 IMPLANT

## 2022-01-10 NOTE — H&P (Signed)
[place]   Primary Care Physician:  Marinda Elk, MD Ophthalmologist: Dr. George Ina  Pre-Procedure History & Physical: HPI:  [name] is a 81 y.o. female here for cataract surgery.   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   Non Hodgkin's lymphoma ([place])    Non Hodgkin's lymphoma ([place])    RAD (reactive airway disease)    Skin cancer     Past Surgical History:  Procedure Laterality Date   ABDOMINAL HYSTERECTOMY     APPENDECTOMY     BASAL CELL CARCINOMA EXCISION     on both sides of nose   BREAST BIOPSY Left 12/2013   hyperplasia   CHOLECYSTECTOMY     COLONOSCOPY N/A 09/16/2021   Procedure: COLONOSCOPY;  Surgeon: Lucilla Lame, MD;  Location: [place];  Service: Endoscopy;  Laterality: N/A;   COLONOSCOPY WITH PROPOFOL N/A 06/05/2017   Procedure: COLONOSCOPY WITH PROPOFOL;  Surgeon: Lollie Sails, MD;  Location: [facility] ENDOSCOPY;  Service: Endoscopy;  Laterality: N/A;   COLONOSCOPY WITH PROPOFOL N/A 05/14/2020   Procedure: COLONOSCOPY WITH PROPOFOL;  Surgeon: Lesly Rubenstein, MD;  Location: [facility] ENDOSCOPY;  Service: Endoscopy;  Laterality: N/A;   ESOPHAGOGASTRODUODENOSCOPY (EGD) WITH PROPOFOL N/A 03/30/2015   Procedure: ESOPHAGOGASTRODUODENOSCOPY (EGD) WITH PROPOFOL;  Surgeon: Lollie Sails, MD;  Location: [facility] ENDOSCOPY;  Service: Endoscopy;  Laterality: N/A;   ESOPHAGOGASTRODUODENOSCOPY (EGD) WITH PROPOFOL N/A 06/05/2017   Procedure: ESOPHAGOGASTRODUODENOSCOPY (EGD) WITH PROPOFOL;  Surgeon:  Lollie Sails, MD;  Location: [facility] ENDOSCOPY;  Service: Endoscopy;  Laterality: N/A;   ESOPHAGOGASTRODUODENOSCOPY (EGD) WITH PROPOFOL N/A 05/14/2020   Procedure: ESOPHAGOGASTRODUODENOSCOPY (EGD) WITH PROPOFOL;  Surgeon: Lesly Rubenstein, MD;  Location: [facility] ENDOSCOPY;  Service: Endoscopy;  Laterality: N/A;   FACIAL COSMETIC SURGERY     history of multiple colonic polyps     lymph node removal from left groin     OOPHORECTOMY     VENTRAL HERNIA REPAIR N/A 10/02/2014   Procedure: HERNIA REPAIR VENTRAL ADULT;  Surgeon: Leonie Green, MD;  Location: [facility] ORS;  Service: General;  Laterality: N/A;  with mesh    Prior to Admission medications   Medication Sig Start Date End Date Taking? Authorizing Provider  diphenhydrAMINE (SOMINEX) 25 MG tablet Take 25 mg by mouth at bedtime as needed for sleep.   Yes [provider]  lisinopril (PRINIVIL,ZESTRIL) 10 MG tablet Take 10 mg by mouth daily. 09/21/16  Yes [provider]  omeprazole (PRILOSEC) 20 MG capsule Take 20 mg by mouth daily. 06/08/15  Yes [provider]  Probiotic Product (PROBIOTIC-10) CHEW Chew by mouth.   Yes [provider]  rosuvastatin (CRESTOR) 5 MG tablet Take 5 mg by mouth daily. 08/12/20 01/10/22 Yes [provider]  loratadine (CLARITIN) 10 MG tablet Take 10 mg by mouth daily. Patient not taking: Reported on 12/28/2021    [provider]    Allergies as of 12/06/2021 -  Review Complete 09/16/2021  Allergen Reaction Noted   Buprenorphine hcl Nausea And Vomiting 03/10/2015   Dilaudid [hydromorphone hcl] Anxiety and Other (See Comments) 06/21/2021   Morphine and related Nausea And Vomiting 09/24/2014    Family History  Problem Relation Age of Onset   Alcohol abuse Father    Stroke Father    Diabetes Sister    Kidney cancer Sister    Melanoma Brother    Diabetes Brother    Heart attack Brother    Breast cancer Neg Hx    Bladder Cancer Neg Hx    Prostate cancer Neg  Hx     Social History   Socioeconomic History   Marital status: Married    Spouse name: Not on file   Number of children: Not on file   Years of education: Not on file   Highest education level: Not on file  Occupational History   Not on file  Tobacco Use   Smoking status: Never   Smokeless tobacco: Never  Vaping Use   Vaping Use: Never used  Substance and Sexual Activity   Alcohol use: Not Currently    Alcohol/week: 1.0 standard drink of alcohol    Types: 1 Cans of beer per week    Comment: rare social occasions only   Drug use: Never   Sexual activity: Never  Other Topics Concern   Not on file  Social History Narrative   Not on file   Social Determinants of Health   Financial Resource Strain: Not on file  Food Insecurity: Not on file  Transportation Needs: Not on file  Physical Activity: Not on file  Stress: Not on file  Social Connections: Not on file  Intimate Partner Violence: Not on file    Review of Systems: See HPI, otherwise negative ROS  Physical Exam: BP 117/84   Pulse 74   Temp 97.7 F (36.5 C) (Temporal)   Ht '5\' 2"'$  (1.575 m)   Wt 75.4 kg   SpO2 98%   BMI 30.42 kg/m  General:   Alert, cooperative in NAD Head:  Normocephalic and atraumatic. Respiratory:  Normal work of breathing. Cardiovascular:  RRR  Impression/Plan: [name] is here for cataract surgery.  Risks, benefits, limitations, and alternatives regarding cataract surgery have been reviewed with the patient.  Questions have been answered.  All parties agreeable.   Birder Robson, MD  01/10/2022, 12:59 PM

## 2022-01-10 NOTE — Anesthesia Preprocedure Evaluation (Signed)
Anesthesia Evaluation  Patient identified by MRN, date of birth, ID band Patient awake    Reviewed: Allergy & Precautions, H&P , NPO status , Patient's Chart, lab work & pertinent test results  History of Anesthesia Complications Negative for: history of anesthetic complications  Airway Mallampati: III  TM Distance: >3 FB Neck ROM: limited    Dental  (+) Poor Dentition, Chipped, Dental Advidsory Given   Pulmonary neg shortness of breath, asthma , COPD, neg recent URI, former smoker (worked around smokers),    Pulmonary exam normal breath sounds clear to auscultation       Cardiovascular Exercise Tolerance: Good hypertension, (-) angina(-) Past MI, (-) Cardiac Stents and (-) DOE negative cardio ROS Normal cardiovascular exam(-) dysrhythmias (-) Valvular Problems/Murmurs Rhythm:regular Rate:Normal     Neuro/Psych  Headaches, neg Seizures negative psych ROS   GI/Hepatic Neg liver ROS, GERD  Controlled,  Endo/Other  negative endocrine ROS  Renal/GU negative Renal ROS  negative genitourinary   Musculoskeletal  (+) Arthritis ,   Abdominal   Peds  Hematology negative hematology ROS (+)   Anesthesia Other Findings Past Medical History:   Non Hodgkin's lymphoma ([facility]CC)                                 Barrett esophagus                                            Skin cancer                                                  Asthma                                                       Migraine                                                     HSV (herpes simplex virus) infection                           Comment:of upper lip   Allergy                                                        Comment:seasonal   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     Arthritis                                                      Comment:degenerative of hip right  Past Surgical History:   lymph node removal from left groin                             CHOLECYSTECTOMY                                               FACIAL COSMETIC SURGERY                                       APPENDECTOMY                                                  ABDOMINAL HYSTERECTOMY                                        history of multiple colonic polyps                            BASAL CELL CARCINOMA EXCISION                                   Comment:on both sides of nose   VENTRAL HERNIA REPAIR                           N/A 10/02/2014      Comment:Procedure: HERNIA REPAIR VENTRAL ADULT;                Surgeon: Leonie Green, MD;  Location:               [facility] ORS;  Service: General;  Laterality: N/A;               with mesh   BREAST BIOPSY                                   Left 12/2013         Comment:hyperplasia     Reproductive/Obstetrics negative OB ROS                             Anesthesia Physical  Anesthesia Plan  ASA: 3  Anesthesia Plan: MAC   Post-op Pain Management:    Induction: Intravenous  PONV Risk Score and Plan: 3 and Midazolam  Airway Management Planned: Nasal Cannula and Natural Airway  Additional Equipment:   Intra-op Plan:   Post-operative Plan:   Informed Consent: I have reviewed the patients History and Physical, chart, labs and discussed the procedure including the risks, benefits and alternatives for the proposed anesthesia with the patient or authorized representative who has indicated his/her understanding and acceptance.     Dental Advisory Given  Plan Discussed with: Anesthesiologist, CRNA and Surgeon  Anesthesia Plan Comments:  Anesthesia Quick Evaluation  

## 2022-01-10 NOTE — Transfer of Care (Signed)
Immediate Anesthesia Transfer of Care Note  Patient: [name]  Procedure(s) Performed: CATARACT EXTRACTION PHACO AND INTRAOCULAR LENS PLACEMENT (IOC) RIGHT (Right: Eye)  Patient Location: PACU  Anesthesia Type: MAC  Level of Consciousness: awake, alert  and patient cooperative  Airway and Oxygen Therapy: Patient Spontanous Breathing and Patient connected to supplemental oxygen  Post-op Assessment: Post-op Vital signs reviewed, Patient's Cardiovascular Status Stable, Respiratory Function Stable, Patent Airway and No signs of Nausea or vomiting  Post-op Vital Signs: Reviewed and stable  Complications: No notable events documented.

## 2022-01-10 NOTE — Op Note (Signed)
PREOPERATIVE DIAGNOSIS:  Nuclear sclerotic cataract of the right eye.   POSTOPERATIVE DIAGNOSIS:  CATARACT   OPERATIVE PROCEDURE:ORPROCALL@   SURGEON:  Birder Robson, MD.   ANESTHESIA:  Anesthesiologist: Martha Clan, MD CRNA: Tobie Poet, CRNA  1.      Managed anesthesia care. 2.      0.44m of Shugarcaine was instilled in the eye following the paracentesis.   COMPLICATIONS:  None.   TECHNIQUE:   Stop and chop   DESCRIPTION OF PROCEDURE:  The patient was examined and consented in the preoperative holding area where the aforementioned topical anesthesia was applied to the right eye and then brought back to the Operating Room where the right eye was prepped and draped in the usual sterile ophthalmic fashion and a lid speculum was placed. A paracentesis was created with the side port blade and the anterior chamber was filled with viscoelastic. A near clear corneal incision was performed with the steel keratome. A continuous curvilinear capsulorrhexis was performed with a cystotome followed by the capsulorrhexis forceps. Hydrodissection and hydrodelineation were carried out with BSS on a blunt cannula. The lens was removed in a stop and chop  technique and the remaining cortical material was removed with the irrigation-aspiration handpiece. The capsular bag was inflated with viscoelastic and the Technis ZCB00  lens was placed in the capsular bag without complication. The remaining viscoelastic was removed from the eye with the irrigation-aspiration handpiece. The wounds were hydrated. The anterior chamber was flushed with BSS and the eye was inflated to physiologic pressure. 0.138mof Vigamox was placed in the anterior chamber. The wounds were found to be water tight. The eye was dressed with Combigan. The patient was given protective glasses to wear throughout the day and a shield with which to sleep tonight. The patient was also given drops with which to begin a drop regimen today and  will follow-up with me in one day. Implant Name Type Inv. Item Serial No. Manufacturer Lot No. LRB No. Used Action  LENS IOL TECNIS EYHANCE 19.5 - [id number] Lens LENS IOL TECNIS EYHANCE 19.5 [id number]  Right 1 Implanted   Procedure(s) with comments: CATARACT EXTRACTION PHACO AND INTRAOCULAR LENS PLACEMENT (IOC) RIGHT (Right) - 7.85 0:38.1  Electronically signed: WiBirder Robson/09/2021 1:25 PM

## 2022-01-10 NOTE — Anesthesia Postprocedure Evaluation (Signed)
Anesthesia Post Note  Patient: [name]  Procedure(s) Performed: CATARACT EXTRACTION PHACO AND INTRAOCULAR LENS PLACEMENT ([facility]) RIGHT (Right: Eye)     Patient location during evaluation: PACU Anesthesia Type: MAC Level of consciousness: awake and alert Pain management: pain level controlled Vital Signs Assessment: post-procedure vital signs reviewed and stable Respiratory status: spontaneous breathing, nonlabored ventilation, respiratory function stable and patient connected to nasal cannula oxygen Cardiovascular status: stable and blood pressure returned to baseline Postop Assessment: no apparent nausea or vomiting Anesthetic complications: no   No notable events documented.  Martha Clan

## 2022-01-11 ENCOUNTER — Encounter: Payer: Self-pay | Admitting: Ophthalmology

## 2022-01-12 ENCOUNTER — Encounter: Payer: Self-pay | Admitting: Ophthalmology

## 2022-01-26 NOTE — Discharge Instructions (Signed)

## 2022-01-27 NOTE — Anesthesia Preprocedure Evaluation (Signed)
Anesthesia Evaluation  Patient identified by MRN, date of birth, ID band Patient awake    Reviewed: Allergy & Precautions, H&P , NPO status , Patient's Chart, lab work & pertinent test results  History of Anesthesia Complications Negative for: history of anesthetic complications  Airway Mallampati: III  TM Distance: >3 FB Neck ROM: limited    Dental  (+) Poor Dentition, Chipped, Dental Advidsory Given   Pulmonary neg shortness of breath, asthma , COPD, neg recent URI, former smoker (worked around smokers),    Pulmonary exam normal breath sounds clear to auscultation       Cardiovascular Exercise Tolerance: Good hypertension, (-) angina(-) Past MI, (-) Cardiac Stents and (-) DOE Normal cardiovascular exam(-) dysrhythmias (-) Valvular Problems/Murmurs Rhythm:regular Rate:Normal     Neuro/Psych  Headaches, neg Seizures negative psych ROS   GI/Hepatic Neg liver ROS, GERD  Controlled,  Endo/Other  negative endocrine ROS  Renal/GU negative Renal ROS  negative genitourinary   Musculoskeletal  (+) Arthritis ,   Abdominal   Peds  Hematology negative hematology ROS (+)   Anesthesia Other Findings Past Medical History:   Non Hodgkin's lymphoma (HCC)                                 Barrett esophagus                                            Skin cancer                                                  Asthma                                                       Migraine                                                     HSV (herpes simplex virus) infection                           Comment:of upper lip   Allergy                                                        Comment:seasonal   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     Arthritis                                                      Comment:degenerative of hip right  Past Surgical  History:   lymph node removal from left groin                             CHOLECYSTECTOMY                                               FACIAL COSMETIC SURGERY                                       APPENDECTOMY                                                  ABDOMINAL HYSTERECTOMY                                        history of multiple colonic polyps                            BASAL CELL CARCINOMA EXCISION                                   Comment:on both sides of nose   VENTRAL HERNIA REPAIR                           N/A 10/02/2014      Comment:Procedure: HERNIA REPAIR VENTRAL ADULT;                Surgeon: Leonie Green, MD;  Location:               [facility];  Service: General;  Laterality: N/A;               with mesh   BREAST BIOPSY                                   Left 12/2013         Comment:hyperplasia     Reproductive/Obstetrics negative OB ROS                             Anesthesia Physical  Anesthesia Plan  ASA: 3  Anesthesia Plan: MAC   Post-op Pain Management:    Induction: Intravenous  PONV Risk Score and Plan: 3 and Midazolam  Airway Management Planned: Nasal Cannula and Natural Airway  Additional Equipment:   Intra-op Plan:   Post-operative Plan:   Informed Consent: I have reviewed the patients History and Physical, chart, labs and discussed the procedure including the risks, benefits and alternatives for the proposed anesthesia with the patient or authorized representative who has indicated his/her understanding and acceptance.     Dental Advisory Given  Plan Discussed with: Anesthesiologist, CRNA and Surgeon  Anesthesia Plan Comments:        Anesthesia Quick Evaluation

## 2022-01-31 ENCOUNTER — Ambulatory Visit
Admission: RE | Admit: 2022-01-31 | Discharge: 2022-01-31 | Disposition: A | Discharge status: Home / Self Care | Payer: Medicare Other | Source: Ambulatory Visit | Attending: Ophthalmology | Admitting: Ophthalmology

## 2022-01-31 ENCOUNTER — Ambulatory Visit: Payer: Medicare Other | Admitting: General Practice

## 2022-01-31 ENCOUNTER — Encounter: Payer: Self-pay | Admitting: Ophthalmology

## 2022-01-31 ENCOUNTER — Other Ambulatory Visit: Payer: Self-pay

## 2022-01-31 ENCOUNTER — Ambulatory Visit (AMBULATORY_SURGERY_CENTER): Payer: Medicare Other | Admitting: General Practice

## 2022-01-31 ENCOUNTER — Encounter
Admission: RE | Disposition: A | Discharge status: Home / Self Care | Payer: Self-pay | Source: Ambulatory Visit | Attending: Ophthalmology

## 2022-01-31 DIAGNOSIS — C8590 Non-Hodgkin lymphoma, unspecified, unspecified site: Secondary | ICD-10-CM | POA: Diagnosis not present

## 2022-01-31 DIAGNOSIS — Z87891 Personal history of nicotine dependence: Secondary | ICD-10-CM | POA: Diagnosis not present

## 2022-01-31 DIAGNOSIS — I10 Essential (primary) hypertension: Secondary | ICD-10-CM | POA: Diagnosis not present

## 2022-01-31 DIAGNOSIS — J449 Chronic obstructive pulmonary disease, unspecified: Secondary | ICD-10-CM | POA: Insufficient documentation

## 2022-01-31 DIAGNOSIS — K219 Gastro-esophageal reflux disease without esophagitis: Secondary | ICD-10-CM | POA: Insufficient documentation

## 2022-01-31 DIAGNOSIS — H2512 Age-related nuclear cataract, left eye: Secondary | ICD-10-CM | POA: Insufficient documentation

## 2022-01-31 HISTORY — PX: CATARACT EXTRACTION W/PHACO: SHX586

## 2022-01-31 SURGERY — PHACOEMULSIFICATION, CATARACT, WITH IOL INSERTION
Anesthesia: Monitor Anesthesia Care | Site: Eye | Laterality: Left

## 2022-01-31 MED ORDER — MOXIFLOXACIN HCL 0.5 % OP SOLN
OPHTHALMIC | Status: DC | PRN
  Administered 2022-01-31: 0.2 mL via OPHTHALMIC

## 2022-01-31 MED ORDER — BRIMONIDINE TARTRATE-TIMOLOL 0.2-0.5 % OP SOLN
OPHTHALMIC | Status: DC | PRN
  Administered 2022-01-31: 1 [drp] via OPHTHALMIC

## 2022-01-31 MED ORDER — TETRACAINE HCL 0.5 % OP SOLN
1.0000 [drp] | OPHTHALMIC | Status: DC | PRN
  Administered 2022-01-31 (×3): 1 [drp] via OPHTHALMIC

## 2022-01-31 MED ORDER — ARMC OPHTHALMIC DILATING DROPS
1.0000 | OPHTHALMIC | Status: DC | PRN
  Administered 2022-01-31 (×3): 1 via OPHTHALMIC

## 2022-01-31 MED ORDER — SIGHTPATH DOSE#1 BSS IO SOLN
INTRAOCULAR | Status: DC | PRN
  Administered 2022-01-31: 15 mL

## 2022-01-31 MED ORDER — MIDAZOLAM HCL 2 MG/2ML IJ SOLN
INTRAMUSCULAR | Status: DC | PRN
  Administered 2022-01-31: 2 mg via INTRAVENOUS

## 2022-01-31 MED ORDER — SIGHTPATH DOSE#1 BSS IO SOLN
INTRAOCULAR | Status: DC | PRN
  Administered 2022-01-31: 1 mL via INTRAMUSCULAR

## 2022-01-31 MED ORDER — SIGHTPATH DOSE#1 BSS IO SOLN
INTRAOCULAR | Status: DC | PRN
  Administered 2022-01-31: 53 mL via OPHTHALMIC

## 2022-01-31 MED ORDER — SIGHTPATH DOSE#1 NA CHONDROIT SULF-NA HYALURON 40-17 MG/ML IO SOLN
INTRAOCULAR | Status: DC | PRN
  Administered 2022-01-31: 1 mL via INTRAOCULAR

## 2022-01-31 SURGICAL SUPPLY — 9 items
CATARACT SUITE SIGHTPATH (MISCELLANEOUS) ×1 IMPLANT
FEE CATARACT SUITE SIGHTPATH (MISCELLANEOUS) ×1 IMPLANT
GLOVE SURG ENC TEXT LTX SZ8 (GLOVE) ×1 IMPLANT
GLOVE SURG TRIUMPH 8.0 PF LTX (GLOVE) ×1 IMPLANT
LENS IOL TECNIS EYHANCE 19.5 (Intraocular Lens) IMPLANT
NDL FILTER BLUNT 18X1 1/2 (NEEDLE) ×1 IMPLANT
NEEDLE FILTER BLUNT 18X1 1/2 (NEEDLE) ×1 IMPLANT
SYR 3ML LL SCALE MARK (SYRINGE) ×1 IMPLANT
WATER STERILE IRR 250ML POUR (IV SOLUTION) ×1 IMPLANT

## 2022-01-31 NOTE — Anesthesia Postprocedure Evaluation (Signed)
Anesthesia Post Note  Patient: [name]  Procedure(s) Performed: CATARACT EXTRACTION PHACO AND INTRAOCULAR LENS PLACEMENT (IOC) LEFT 8.45 00:46.1 (Left: Eye)     Patient location during evaluation: PACU Anesthesia Type: MAC Level of consciousness: awake and alert Pain management: pain level controlled Vital Signs Assessment: post-procedure vital signs reviewed and stable Respiratory status: spontaneous breathing, nonlabored ventilation and respiratory function stable Cardiovascular status: blood pressure returned to baseline and stable Postop Assessment: no apparent nausea or vomiting Anesthetic complications: no   No notable events documented.  Iran Ouch

## 2022-01-31 NOTE — Transfer of Care (Signed)
Immediate Anesthesia Transfer of Care Note  Patient: [name]  Procedure(s) Performed: CATARACT EXTRACTION PHACO AND INTRAOCULAR LENS PLACEMENT (IOC) LEFT 8.45 00:46.1 (Left: Eye)  Patient Location: PACU  Anesthesia Type: MAC  Level of Consciousness: awake, alert  and patient cooperative  Airway and Oxygen Therapy: Patient Spontanous Breathing and Patient connected to supplemental oxygen  Post-op Assessment: Post-op Vital signs reviewed, Patient's Cardiovascular Status Stable, Respiratory Function Stable, Patent Airway and No signs of Nausea or vomiting  Post-op Vital Signs: Reviewed and stable  Complications: No notable events documented.

## 2022-01-31 NOTE — Op Note (Signed)
PREOPERATIVE DIAGNOSIS:  Nuclear sclerotic cataract of the left eye.   POSTOPERATIVE DIAGNOSIS:  Nuclear sclerotic cataract of the left eye.   OPERATIVE PROCEDURE:ORPROCALL@   SURGEON:  Birder Robson, MD.   ANESTHESIA:  Anesthesiologist: Iran Ouch, MD CRNA: Tobie Poet, CRNA  1.      Managed anesthesia care. 2.     0.21m of Shugarcaine was instilled following the paracentesis   COMPLICATIONS:  None.   TECHNIQUE:   Stop and chop   DESCRIPTION OF PROCEDURE:  The patient was examined and consented in the preoperative holding area where the aforementioned topical anesthesia was applied to the left eye and then brought back to the Operating Room where the left eye was prepped and draped in the usual sterile ophthalmic fashion and a lid speculum was placed. A paracentesis was created with the side port blade and the anterior chamber was filled with viscoelastic. A near clear corneal incision was performed with the steel keratome. A continuous curvilinear capsulorrhexis was performed with a cystotome followed by the capsulorrhexis forceps. Hydrodissection and hydrodelineation were carried out with BSS on a blunt cannula. The lens was removed in a stop and chop  technique and the remaining cortical material was removed with the irrigation-aspiration handpiece. The capsular bag was inflated with viscoelastic and the Technis ZCB00 lens was placed in the capsular bag without complication. The remaining viscoelastic was removed from the eye with the irrigation-aspiration handpiece. The wounds were hydrated. The anterior chamber was flushed with BSS and the eye was inflated to physiologic pressure. 0.148mVigamox was placed in the anterior chamber. The wounds were found to be water tight. The eye was dressed with Combigan. The patient was given protective glasses to wear throughout the day and a shield with which to sleep tonight. The patient was also given drops with which to begin a  drop regimen today and will follow-up with me in one day. Implant Name Type Inv. Item Serial No. Manufacturer Lot No. LRB No. Used Action  LENS IOL TECNIS EYHANCE 19.5 - [id number] Lens LENS IOL TECNIS EYHANCE 19.5 [id number]  Left 1 Implanted    Procedure(s): CATARACT EXTRACTION PHACO AND INTRAOCULAR LENS PLACEMENT (IOC) LEFT 8.45 00:46.1 (Left)  Electronically signed: WiBirder Robson/26/2023 12:52 PM

## 2022-01-31 NOTE — H&P (Signed)
[place]   Primary Care Physician:  Marinda Elk, MD Ophthalmologist: Dr.Poriflio  Pre-Procedure History & Physical: HPI:  [name] is a 81 y.o. female here for cataract surgery.   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 ([place]) 09/13/2014   Non Hodgkin's lymphoma ([place])    Non Hodgkin's lymphoma ([place])    RAD (reactive airway disease)    Skin cancer     Past Surgical History:  Procedure Laterality Date   ABDOMINAL HYSTERECTOMY     APPENDECTOMY     BASAL CELL CARCINOMA EXCISION     on both sides of nose   BREAST BIOPSY Left 12/2013   hyperplasia   CATARACT EXTRACTION W/PHACO Right 01/10/2022   Procedure: CATARACT EXTRACTION PHACO AND INTRAOCULAR LENS PLACEMENT ([place]) RIGHT;  Surgeon: Birder Robson, MD;  Location: [facility];  Service: Ophthalmology;  Laterality: Right;  7.85 0:38.1   CHOLECYSTECTOMY     COLONOSCOPY N/A 09/16/2021   Procedure: COLONOSCOPY;  Surgeon: Lucilla Lame, MD;  Location: [place];  Service: Endoscopy;  Laterality: N/A;   COLONOSCOPY WITH PROPOFOL N/A 06/05/2017   Procedure: COLONOSCOPY WITH PROPOFOL;  Surgeon: Lollie Sails, MD;  Location: [facility] ENDOSCOPY;  Service: Endoscopy;  Laterality: N/A;   COLONOSCOPY WITH PROPOFOL N/A 05/14/2020   Procedure: COLONOSCOPY WITH PROPOFOL;  Surgeon: Lesly Rubenstein, MD;  Location: [facility] ENDOSCOPY;  Service: Endoscopy;  Laterality: N/A;   ESOPHAGOGASTRODUODENOSCOPY (EGD) WITH PROPOFOL N/A 03/30/2015   Procedure: ESOPHAGOGASTRODUODENOSCOPY (EGD)  WITH PROPOFOL;  Surgeon: Lollie Sails, MD;  Location[facility] ENDOSCOPY;  Service: Endoscopy;  Laterality: N/A;   ESOPHAGOGASTRODUODENOSCOPY (EGD) WITH PROPOFOL N/A 06/05/2017   Procedure: ESOPHAGOGASTRODUODENOSCOPY (EGD) WITH PROPOFOL;  Surgeon: Lollie Sails, MD;  Location: [facility] ENDOSCOPY;  Service: Endoscopy;  Laterality: N/A;   ESOPHAGOGASTRODUODENOSCOPY (EGD) WITH PROPOFOL N/A 05/14/2020   Procedure: ESOPHAGOGASTRODUODENOSCOPY (EGD) WITH PROPOFOL;  Surgeon: Lesly Rubenstein, MD;  Location: [facility] ENDOSCOPY;  Service: Endoscopy;  Laterality: N/A;   FACIAL COSMETIC SURGERY     history of multiple colonic polyps     lymph node removal from left groin     OOPHORECTOMY     VENTRAL HERNIA REPAIR N/A 10/02/2014   Procedure: HERNIA REPAIR VENTRAL ADULT;  Surgeon: Leonie Green, MD;  Location: [facility] ORS;  Service: General;  Laterality: N/A;  with mesh    Prior to Admission medications   Medication Sig Start Date End Date Taking? Authorizing Provider  diphenhydrAMINE (SOMINEX) 25 MG tablet Take 25 mg by mouth at bedtime as needed for sleep.   Yes [provider]  lisinopril (PRINIVIL,ZESTRIL) 10 MG tablet Take 10 mg by mouth daily. 09/21/16  Yes [provider]  omeprazole (PRILOSEC) 20 MG capsule Take 20 mg by mouth daily. 06/08/15  Yes [provider]  Probiotic Product (PROBIOTIC-10) CHEW Chew by mouth.   Yes [provider]  loratadine (CLARITIN) 10 MG tablet Take 10 mg by mouth daily.  Patient not taking: Reported on 12/28/2021    [provider]  rosuvastatin (CRESTOR) 5 MG tablet Take 5 mg by mouth daily. 08/12/20 01/10/22  [provider]    Allergies as of 12/06/2021 - Review Complete 09/16/2021  Allergen Reaction Noted   Buprenorphine hcl Nausea And Vomiting 03/10/2015   Dilaudid [hydromorphone hcl] Anxiety and Other (See Comments) 06/21/2021   Morphine and related Nausea And Vomiting 09/24/2014    Family History  Problem  Relation Age of Onset   Alcohol abuse Father    Stroke Father    Diabetes Sister    Kidney cancer Sister    Melanoma Brother    Diabetes Brother    Heart attack Brother    Breast cancer Neg Hx    Bladder Cancer Neg Hx    Prostate cancer Neg Hx     Social History   Socioeconomic History   Marital status: Married    Spouse name: Not on file   Number of children: Not on file   Years of education: Not on file   Highest education level: Not on file  Occupational History   Not on file  Tobacco Use   Smoking status: Never   Smokeless tobacco: Never  Vaping Use   Vaping Use: Never used  Substance and Sexual Activity   Alcohol use: Not Currently    Alcohol/week: 1.0 standard drink of alcohol    Types: 1 Cans of beer per week    Comment: rare social occasions only   Drug use: Never   Sexual activity: Never  Other Topics Concern   Not on file  Social History Narrative   Not on file   Social Determinants of Health   Financial Resource Strain: Not on file  Food Insecurity: Not on file  Transportation Needs: Not on file  Physical Activity: Not on file  Stress: Not on file  Social Connections: Not on file  Intimate Partner Violence: Not on file    Review of Systems: See HPI, otherwise negative ROS  Physical Exam: BP (!) 157/95   Temp (!) 97.3 F (36.3 C) (Tympanic)   Ht 5' 2.01" (1.575 m)   Wt 75.4 kg   SpO2 98%   BMI 30.39 kg/m  General:   Alert, cooperative in NAD Head:  Normocephalic and atraumatic. Respiratory:  Normal work of breathing. Cardiovascular:  RRR  Impression/Plan: [name] is here for cataract surgery.  Risks, benefits, limitations, and alternatives regarding cataract surgery have been reviewed with the patient.  Questions have been answered.  All parties agreeable.   Birder Robson, MD  01/31/2022, 12:27 PM

## 2022-02-01 ENCOUNTER — Encounter: Payer: Self-pay | Admitting: Ophthalmology

## 2022-02-24 ENCOUNTER — Ambulatory Visit: Payer: Medicare Other | Admitting: Internal Medicine

## 2022-02-24 ENCOUNTER — Other Ambulatory Visit: Payer: Medicare Other

## 2022-02-27 ENCOUNTER — Other Ambulatory Visit: Payer: Medicare Other

## 2022-02-27 ENCOUNTER — Ambulatory Visit: Payer: Medicare Other | Admitting: Internal Medicine

## 2022-03-01 ENCOUNTER — Inpatient Hospital Stay (HOSPITAL_BASED_OUTPATIENT_CLINIC_OR_DEPARTMENT_OTHER): Payer: Medicare Other | Admitting: Internal Medicine

## 2022-03-01 ENCOUNTER — Inpatient Hospital Stay: Payer: Medicare Other | Attending: Internal Medicine

## 2022-03-01 ENCOUNTER — Encounter: Payer: Self-pay | Admitting: Internal Medicine

## 2022-03-01 DIAGNOSIS — Z87440 Personal history of urinary (tract) infections: Secondary | ICD-10-CM | POA: Insufficient documentation

## 2022-03-01 DIAGNOSIS — C8203 Follicular lymphoma grade I, intra-abdominal lymph nodes: Secondary | ICD-10-CM

## 2022-03-01 DIAGNOSIS — Z79899 Other long term (current) drug therapy: Secondary | ICD-10-CM | POA: Insufficient documentation

## 2022-03-01 DIAGNOSIS — Z8719 Personal history of other diseases of the digestive system: Secondary | ICD-10-CM | POA: Insufficient documentation

## 2022-03-01 DIAGNOSIS — Z811 Family history of alcohol abuse and dependence: Secondary | ICD-10-CM | POA: Diagnosis not present

## 2022-03-01 DIAGNOSIS — Z85828 Personal history of other malignant neoplasm of skin: Secondary | ICD-10-CM | POA: Insufficient documentation

## 2022-03-01 DIAGNOSIS — Z8051 Family history of malignant neoplasm of kidney: Secondary | ICD-10-CM | POA: Insufficient documentation

## 2022-03-01 DIAGNOSIS — Z823 Family history of stroke: Secondary | ICD-10-CM | POA: Insufficient documentation

## 2022-03-01 DIAGNOSIS — Z885 Allergy status to narcotic agent status: Secondary | ICD-10-CM | POA: Insufficient documentation

## 2022-03-01 DIAGNOSIS — Z9049 Acquired absence of other specified parts of digestive tract: Secondary | ICD-10-CM | POA: Insufficient documentation

## 2022-03-01 DIAGNOSIS — Z90721 Acquired absence of ovaries, unilateral: Secondary | ICD-10-CM | POA: Insufficient documentation

## 2022-03-01 DIAGNOSIS — Z833 Family history of diabetes mellitus: Secondary | ICD-10-CM | POA: Insufficient documentation

## 2022-03-01 DIAGNOSIS — Z808 Family history of malignant neoplasm of other organs or systems: Secondary | ICD-10-CM | POA: Insufficient documentation

## 2022-03-01 DIAGNOSIS — K56600 Partial intestinal obstruction, unspecified as to cause: Secondary | ICD-10-CM | POA: Diagnosis not present

## 2022-03-01 LAB — CBC WITH DIFFERENTIAL/PLATELET
Abs Immature Granulocytes: 0.01 10*3/uL (ref 0.00–0.07)
Basophils Absolute: 0 10*3/uL (ref 0.0–0.1)
Basophils Relative: 1 %
Eosinophils Absolute: 0.1 10*3/uL (ref 0.0–0.5)
Eosinophils Relative: 1 %
HCT: 41.7 % (ref 36.0–46.0)
Hemoglobin: 14 g/dL (ref 12.0–15.0)
Immature Granulocytes: 0 %
Lymphocytes Relative: 53 %
Lymphs Abs: 3 10*3/uL (ref 0.7–4.0)
MCH: 29.7 pg (ref 26.0–34.0)
MCHC: 33.6 g/dL (ref 30.0–36.0)
MCV: 88.5 fL (ref 80.0–100.0)
Monocytes Absolute: 0.4 10*3/uL (ref 0.1–1.0)
Monocytes Relative: 7 %
Neutro Abs: 2.2 10*3/uL (ref 1.7–7.7)
Neutrophils Relative %: 38 %
Platelets: 245 10*3/uL (ref 150–400)
RBC: 4.71 MIL/uL (ref 3.87–5.11)
RDW: 13.6 % (ref 11.5–15.5)
WBC: 5.7 10*3/uL (ref 4.0–10.5)
nRBC: 0 % (ref 0.0–0.2)

## 2022-03-01 LAB — COMPREHENSIVE METABOLIC PANEL
ALT: 20 U/L (ref 0–44)
AST: 21 U/L (ref 15–41)
Albumin: 4.2 g/dL (ref 3.5–5.0)
Alkaline Phosphatase: 58 U/L (ref 38–126)
Anion gap: 7 (ref 5–15)
BUN: 16 mg/dL (ref 8–23)
CO2: 26 mmol/L (ref 22–32)
Calcium: 9.4 mg/dL (ref 8.9–10.3)
Chloride: 106 mmol/L (ref 98–111)
Creatinine, Ser: 0.92 mg/dL (ref 0.44–1.00)
GFR, Estimated: >60 mL/min (ref >60)
Glucose, Bld: 107 mg/dL — ABNORMAL HIGH (ref 70–99)
Potassium: 3.9 mmol/L (ref 3.5–5.1)
Sodium: 139 mmol/L (ref 135–145)
Total Bilirubin: 0.4 mg/dL (ref 0.3–1.2)
Total Protein: 7.6 g/dL (ref 6.5–8.1)

## 2022-03-01 LAB — LACTATE DEHYDROGENASE: LDH: 106 U/L (ref 98–192)

## 2022-03-01 NOTE — Progress Notes (Signed)
[place] OFFICE PROGRESS NOTE  Patient Care Team: Marinda Elk, MD as PCP - General (Physician Assistant) Cammie Sickle, MD as Consulting Physician (Hematology and Oncology)   Cancer Staging  No matching staging information was found for the patient.    Oncology History Overview Note    1.FEB 2013-  MESENTERIC LYMPHADENOPATHY;] Pet positive lymphadenopathy.; . 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 3. Rituxan weekly x4 finished in March of 2013 4. PET scan in July of 2013 complete remission. 5. Maintenance Rituxan started in July of 2013;  x 2 years [finished 2015]  #April 2019-melanoma/left side of the nose [2020]/ Left arm [2021][Dr. Kowalski/Dr.Dasher]   # LFTs- slightly up/ ? faty liver.  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  Ambulating independently.  81 year old patient above history for follow-up of lymphoma status post Rituxan maintenance was completed in 2015 is here for follow-up.Marland Kitchen  Pt reports she recently had shingles and UTI 2 weeks ago. Pt also reports having cataract surgery on both eyes in September.  In the interim patient denies any new lymph nodes.  Denies any night sweats.  Denies any fevers or chills.Marland Kitchen  REVIEW OF SYSTEMS:  Review of Systems  Constitutional: Negative.  Negative for chills, fever, malaise/fatigue and weight loss.  HENT:  Negative for congestion, ear pain and tinnitus.   Eyes: Negative.  Negative for blurred vision and double vision.  Respiratory: Negative.  Negative for cough, sputum production and shortness of breath.   Cardiovascular: Negative.  Negative for chest pain, palpitations and leg swelling.   Gastrointestinal: Negative.  Negative for abdominal pain, constipation, diarrhea, nausea and vomiting.  Musculoskeletal:  Positive for back pain and joint pain. Negative for falls.  Neurological: Negative.  Negative for weakness and headaches.  Endo/Heme/Allergies: Negative.  Does not bruise/bleed easily.  Psychiatric/Behavioral: Negative.  Negative for depression. The patient is not nervous/anxious and does not have insomnia.    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   Hyperlipidemia    Hypertension    Melanoma ([place])    Migraine    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 ([place]) 09/13/2014   Non Hodgkin's lymphoma ([place])    Non Hodgkin's lymphoma ([place])    RAD (reactive airway disease)    Skin cancer     PAST SURGICAL HISTORY :   Past Surgical History:  Procedure Laterality Date   ABDOMINAL HYSTERECTOMY     APPENDECTOMY     BASAL CELL CARCINOMA EXCISION     on both sides of nose   BREAST BIOPSY Left 12/2013   hyperplasia   CATARACT EXTRACTION W/PHACO Right 01/10/2022   Procedure: CATARACT EXTRACTION PHACO AND INTRAOCULAR LENS PLACEMENT ([place]) RIGHT;  Surgeon: Birder Robson, MD;  Location: [facility];  Service: Ophthalmology;  Laterality: Right;  7.85 0:38.1   CATARACT EXTRACTION W/PHACO Left 01/31/2022   Procedure: CATARACT EXTRACTION PHACO AND INTRAOCULAR LENS PLACEMENT ([facility]) LEFT 8.45 00:46.1;  Surgeon: Birder Robson, MD;  Location: [facility];  Service: Ophthalmology;  Laterality: Left;   CHOLECYSTECTOMY     COLONOSCOPY N/A 09/16/2021   Procedure: COLONOSCOPY;  Surgeon: Lucilla Lame, MD;  Location: [facility];  Service: Endoscopy;  Laterality: N/A;   COLONOSCOPY WITH PROPOFOL N/A 06/05/2017   Procedure: COLONOSCOPY WITH PROPOFOL;  Surgeon: Lollie Sails, MD;  Location: [facility] END[facility]COPY;  Service: Endoscopy;  Laterality: N/A;   COLONOSCOPY WITH PROPOFOL N/A 05/14/2020   Procedure: COLONOSCOPY WITH PROPOFOL;  Surgeon: Lesly Rubenstein, MD;  Location: [facility] ENDOSCOPY;  Service: Endoscopy;  Laterality: N/A;   ESOPHAGOGASTRODUODENOSCOPY (EGD) WITH PROPOFOL N/A 03/30/2015   Procedure: ESOPHAGOGASTRODUODENOSCOPY (EGD) WITH PROPOFOL;  Surgeon: Lollie Sails, MD;  Location: [facility] ENDOSCOPY;  Service: Endoscopy;  Laterality: N/A;   ESOPHAGOGASTRODUODENOSCOPY (EGD) WITH PROPOFOL N/A 06/05/2017   Procedure: ESOPHAGOGASTRODUODENOSCOPY (EGD) WITH PROPOFOL;  Surgeon: Lollie Sails, MD;  Location: [facility] ENDOSCOPY;  Service: Endoscopy;  Laterality: N/A;   ESOPHAGOGASTRODUODENOSCOPY (EGD) WITH PROPOFOL N/A 05/14/2020   Procedure: ESOPHAGOGASTRODUODENOSCOPY (EGD) WITH PROPOFOL;  Surgeon: Lesly Rubenstein, MD;  Location: [facility] ENDOSCOPY;  Service: Endoscopy;  Laterality: N/A;   FACIAL COSMETIC SURGERY     history of multiple colonic polyps     lymph node removal from left groin     OOPHORECTOMY     VENTRAL HERNIA REPAIR N/A 10/02/2014   Procedure: HERNIA REPAIR VENTRAL ADULT;  Surgeon: Leonie Green, MD;  Location: [facility] ORS;  Service: General;  Laterality: N/A;  with mesh    FAMILY HISTORY :   Family History  Problem Relation Age of Onset   Alcohol abuse Father    Stroke Father    Diabetes Sister    Kidney cancer Sister    Melanoma Brother    Diabetes Brother    Heart attack Brother    Breast cancer Neg Hx    Bladder Cancer Neg Hx    Prostate cancer Neg Hx     SOCIAL HISTORY:   Social History   Tobacco Use   Smoking status: Never   Smokeless tobacco: Never  Vaping Use   Vaping Use: Never used  Substance Use Topics   Alcohol use: Not Currently    Alcohol/week: 1.0 standard  drink of alcohol    Types: 1 Cans of beer per week    Comment: rare social occasions only   Drug use: Never    ALLERGIES:  is allergic to buprenorphine hcl, dilaudid [hydromorphone hcl], and morphine and related.  MEDICATIONS:  Current Outpatient Medications  Medication Sig Dispense Refill   diphenhydrAMINE (BENADRYL) 25 MG tablet Take 25 mg by mouth every 6 (six) hours as needed.     lisinopril (PRINIVIL,ZESTRIL) 10 MG tablet Take 10 mg by mouth daily.     loratadine (CLARITIN) 10 MG tablet Take 10 mg by mouth daily.     omeprazole (PRILOSEC) 20 MG capsule Take 20 mg by mouth daily.     Probiotic Product (PROBIOTIC-10) CHEW Chew by mouth.     rosuvastatin (CRESTOR) 5 MG tablet Take 5 mg by mouth daily.     No current facility-administered medications for this visit.    PHYSICAL EXAMINATION: ECOG PERFORMANCE STATUS: 0 - Asymptomatic  BP 109/76 (BP Location: Right Arm, Patient Position: Sitting)   Pulse 75   Temp (!) 96.3 F (35.7 C) (Tympanic)  Resp 16   Wt 168 lb (76.2 kg)   SpO2 94%   BMI 30.72 kg/m   Filed Weights   03/01/22 1507  Weight: 168 lb (76.2 kg)   Physical Exam Constitutional:      Comments: Obese.  Walk independently.  HENT:     Head: Normocephalic and atraumatic.     Mouth/Throat:     Pharynx: No oropharyngeal exudate.  Eyes:     Pupils: Pupils are equal, round, and reactive to light.  Cardiovascular:     Rate and Rhythm: Normal rate and regular rhythm.  Pulmonary:     Effort: Pulmonary effort is normal. No respiratory distress.     Breath sounds: Normal breath sounds. No wheezing.  Abdominal:     General: Bowel sounds are normal. There is no distension.     Palpations: Abdomen is soft. There is no mass.     Tenderness: There is no abdominal tenderness. There is no guarding or rebound.  Musculoskeletal:        General: No tenderness. Normal range of motion.     Cervical back: Normal range of motion and neck supple.  Skin:    General: Skin is  warm.  Neurological:     Mental Status: She is alert and oriented to person, place, and time.  Psychiatric:        Mood and Affect: Affect normal.    LABORATORY DATA:  I have reviewed the data as listed    Component Value Date/Time   NA 139 03/01/2022 1433   NA 141 03/03/2014 1022   K 3.9 03/01/2022 1433   K 4.1 03/03/2014 1022   CL 106 03/01/2022 1433   CL 105 03/03/2014 1022   CO2 26 03/01/2022 1433   CO2 28 03/03/2014 1022   GLUCOSE 107 (H) 03/01/2022 1433   GLUCOSE 112 (H) 03/03/2014 1022   BUN 16 03/01/2022 1433   BUN 18 03/03/2014 1022   CREATININE 0.92 03/01/2022 1433   CREATININE 0.94 03/03/2014 1022   CALCIUM 9.4 03/01/2022 1433   CALCIUM 9.4 03/03/2014 1022   PROT 7.6 03/01/2022 1433   PROT 7.4 03/03/2014 1022   ALBUMIN 4.2 03/01/2022 1433   ALBUMIN 4.1 03/03/2014 1022   AST 21 03/01/2022 1433   AST 29 03/03/2014 1022   ALT 20 03/01/2022 1433   ALT 53 03/03/2014 1022   ALKPHOS 58 03/01/2022 1433   ALKPHOS 81 03/03/2014 1022   BILITOT 0.4 03/01/2022 1433   BILITOT 0.4 03/03/2014 1022   GFRNONAA >60 03/01/2022 1433   GFRNONAA >60 03/03/2014 1022   GFRNONAA >60 09/02/2013 0939   GFRAA >60 08/21/2019 1031   GFRAA >60 03/03/2014 1022   GFRAA >60 09/02/2013 0939    No results found for: "SPEP", "UPEP"  Lab Results  Component Value Date   WBC 5.7 03/01/2022   NEUTROABS 2.2 03/01/2022   HGB 14.0 03/01/2022   HCT 41.7 03/01/2022   MCV 88.5 03/01/2022   PLT 245 03/01/2022      Chemistry      Component Value Date/Time   NA 139 03/01/2022 1433   NA 141 03/03/2014 1022   K 3.9 03/01/2022 1433   K 4.1 03/03/2014 1022   CL 106 03/01/2022 1433   CL 105 03/03/2014 1022   CO2 26 03/01/2022 1433   CO2 28 03/03/2014 1022   BUN 16 03/01/2022 1433   BUN 18 03/03/2014 1022   CREATININE 0.92 03/01/2022 1433   CREATININE 0.94 03/03/2014 1022  Component Value Date/Time   CALCIUM 9.4 03/01/2022 1433   CALCIUM 9.4 03/03/2014 1022   ALKPHOS 58 03/01/2022  1433   ALKPHOS 81 03/03/2014 1022   AST 21 03/01/2022 1433   AST 29 03/03/2014 1022   ALT 20 03/01/2022 1433   ALT 53 03/03/2014 1022   BILITOT 0.4 03/01/2022 1433   BILITOT 0.4 03/03/2014 1022       RADIOGRAPHIC STUDIES: I have personally reviewed the radiological images as listed and agreed with the findings in the report. No results found.   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  CT scan FEB 2023 [[facility]]-negative for any lymphadenopathy- STABLE  Labs today within normal limits.  # Hx of melanoma x2 [facial 2019] s/p surgery; Dr.Dasher-recommend close surveillance with dermatology- STABLE  # Disposition:  # follow-up 6 m-MD /lab-CBC CMP [facility]- Dr.B   Orders Placed This Encounter  Procedures   CBC with Differential/Platelet    Standing Status:   Future    Standing Expiration Date:   03/02/2023   Comprehensive metabolic panel    Standing Status:   Future    Standing Expiration Date:   03/02/2023   Lactate dehydrogenase    Standing Status:   Future    Standing Expiration Date:   03/02/2023     Cammie Sickle, MD 03/01/2022 10:57 PM

## 2022-03-01 NOTE — Assessment & Plan Note (Addendum)
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  CT scan FEB 2023 [PS[facility]]-negative for any lymphadenopathy- STABLE  Labs today within normal limits.  # Hx of melanoma x2 [facial 2019] s/p surgery; Dr.Dasher-recommend close surveillance with dermatology- STABLE  # Disposition:  # follow-up 6 m-MD /lab-CBC CMP [facility]- Dr.B

## 2022-03-01 NOTE — Progress Notes (Signed)
Pt reports she recently had shingles and UTI 2 weeks ago. Pt also reports having cataract surgery on both eyes in September. Pt spoke about the passing of her sister who also was a patient and passed away in 2023/01/19.

## 2022-03-15 ENCOUNTER — Inpatient Hospital Stay
Admission: EM | Admit: 2022-03-15 | Discharge: 2022-03-17 | DRG: 389 | Disposition: A | Discharge status: Home / Self Care | Payer: Medicare Other | Attending: Internal Medicine | Admitting: Internal Medicine

## 2022-03-15 ENCOUNTER — Encounter: Payer: Self-pay | Admitting: Emergency Medicine

## 2022-03-15 ENCOUNTER — Other Ambulatory Visit: Payer: Self-pay

## 2022-03-15 ENCOUNTER — Emergency Department: Payer: Medicare Other

## 2022-03-15 DIAGNOSIS — R197 Diarrhea, unspecified: Secondary | ICD-10-CM | POA: Diagnosis not present

## 2022-03-15 DIAGNOSIS — K219 Gastro-esophageal reflux disease without esophagitis: Secondary | ICD-10-CM | POA: Diagnosis present

## 2022-03-15 DIAGNOSIS — Z8051 Family history of malignant neoplasm of kidney: Secondary | ICD-10-CM

## 2022-03-15 DIAGNOSIS — R112 Nausea with vomiting, unspecified: Secondary | ICD-10-CM | POA: Diagnosis present

## 2022-03-15 DIAGNOSIS — K56600 Partial intestinal obstruction, unspecified as to cause: Secondary | ICD-10-CM | POA: Diagnosis not present

## 2022-03-15 DIAGNOSIS — K567 Ileus, unspecified: Secondary | ICD-10-CM | POA: Diagnosis present

## 2022-03-15 DIAGNOSIS — I10 Essential (primary) hypertension: Secondary | ICD-10-CM | POA: Diagnosis present

## 2022-03-15 DIAGNOSIS — Z90710 Acquired absence of both cervix and uterus: Secondary | ICD-10-CM

## 2022-03-15 DIAGNOSIS — Z9049 Acquired absence of other specified parts of digestive tract: Secondary | ICD-10-CM

## 2022-03-15 DIAGNOSIS — E785 Hyperlipidemia, unspecified: Secondary | ICD-10-CM | POA: Diagnosis present

## 2022-03-15 DIAGNOSIS — Z8719 Personal history of other diseases of the digestive system: Secondary | ICD-10-CM

## 2022-03-15 DIAGNOSIS — Z888 Allergy status to other drugs, medicaments and biological substances status: Secondary | ICD-10-CM

## 2022-03-15 DIAGNOSIS — Z8619 Personal history of other infectious and parasitic diseases: Secondary | ICD-10-CM

## 2022-03-15 DIAGNOSIS — Z885 Allergy status to narcotic agent status: Secondary | ICD-10-CM

## 2022-03-15 DIAGNOSIS — Z808 Family history of malignant neoplasm of other organs or systems: Secondary | ICD-10-CM

## 2022-03-15 DIAGNOSIS — N3090 Cystitis, unspecified without hematuria: Secondary | ICD-10-CM | POA: Diagnosis present

## 2022-03-15 DIAGNOSIS — Z823 Family history of stroke: Secondary | ICD-10-CM

## 2022-03-15 DIAGNOSIS — Z85828 Personal history of other malignant neoplasm of skin: Secondary | ICD-10-CM

## 2022-03-15 DIAGNOSIS — N3000 Acute cystitis without hematuria: Secondary | ICD-10-CM | POA: Diagnosis present

## 2022-03-15 DIAGNOSIS — Z833 Family history of diabetes mellitus: Secondary | ICD-10-CM

## 2022-03-15 DIAGNOSIS — Z8572 Personal history of non-Hodgkin lymphomas: Secondary | ICD-10-CM

## 2022-03-15 DIAGNOSIS — Z8249 Family history of ischemic heart disease and other diseases of the circulatory system: Secondary | ICD-10-CM

## 2022-03-15 DIAGNOSIS — Z811 Family history of alcohol abuse and dependence: Secondary | ICD-10-CM

## 2022-03-15 LAB — URINALYSIS, ROUTINE W REFLEX MICROSCOPIC
Bilirubin Urine: NEGATIVE
Glucose, UA: NEGATIVE mg/dL
Ketones, ur: NEGATIVE mg/dL
Nitrite: NEGATIVE
Protein, ur: NEGATIVE mg/dL
Specific Gravity, Urine: 1.008 (ref 1.005–1.030)
pH: 5 (ref 5.0–8.0)

## 2022-03-15 LAB — COMPREHENSIVE METABOLIC PANEL
ALT: 14 U/L (ref 0–44)
AST: 17 U/L (ref 15–41)
Albumin: 4 g/dL (ref 3.5–5.0)
Alkaline Phosphatase: 53 U/L (ref 38–126)
Anion gap: 7 (ref 5–15)
BUN: 10 mg/dL (ref 8–23)
CO2: 23 mmol/L (ref 22–32)
Calcium: 9.1 mg/dL (ref 8.9–10.3)
Chloride: 106 mmol/L (ref 98–111)
Creatinine, Ser: 0.74 mg/dL (ref 0.44–1.00)
GFR, Estimated: >60 mL/min (ref >60)
Glucose, Bld: 123 mg/dL — ABNORMAL HIGH (ref 70–99)
Potassium: 3.7 mmol/L (ref 3.5–5.1)
Sodium: 136 mmol/L (ref 135–145)
Total Bilirubin: 1 mg/dL (ref 0.3–1.2)
Total Protein: 7.1 g/dL (ref 6.5–8.1)

## 2022-03-15 LAB — CBC
HCT: 40.7 % (ref 36.0–46.0)
Hemoglobin: 14 g/dL (ref 12.0–15.0)
MCH: 29.6 pg (ref 26.0–34.0)
MCHC: 34.4 g/dL (ref 30.0–36.0)
MCV: 86 fL (ref 80.0–100.0)
Platelets: 271 10*3/uL (ref 150–400)
RBC: 4.73 MIL/uL (ref 3.87–5.11)
RDW: 13.6 % (ref 11.5–15.5)
WBC: 6.4 10*3/uL (ref 4.0–10.5)
nRBC: 0 % (ref 0.0–0.2)

## 2022-03-15 LAB — LIPASE, BLOOD: Lipase: 29 U/L (ref 11–51)

## 2022-03-15 MED ORDER — HEPARIN SODIUM (PORCINE) 5000 UNIT/ML IJ SOLN
5000.0000 [IU] | Freq: Three times a day (TID) | INTRAMUSCULAR | Status: DC
  Administered 2022-03-16 – 2022-03-17 (×5): 5000 [IU] via SUBCUTANEOUS
  Filled 2022-03-15 (×5): qty 1

## 2022-03-15 MED ORDER — SODIUM CHLORIDE 0.9 % IV SOLN
2.0000 g | INTRAVENOUS | Status: DC
  Administered 2022-03-16 – 2022-03-17 (×2): 2 g via INTRAVENOUS
  Filled 2022-03-15: qty 20
  Filled 2022-03-15: qty 2

## 2022-03-15 MED ORDER — HYDRALAZINE HCL 20 MG/ML IJ SOLN: 10.0000 mg | Freq: Four times a day (QID) | INTRAMUSCULAR | Status: DC | PRN

## 2022-03-15 MED ORDER — SODIUM CHLORIDE 0.9 % IV SOLN
2.0000 g | Freq: Once | INTRAVENOUS | Status: AC
  Administered 2022-03-15: 2 g via INTRAVENOUS
  Filled 2022-03-15: qty 20

## 2022-03-15 MED ORDER — SODIUM CHLORIDE 0.9% FLUSH
3.0000 mL | Freq: Two times a day (BID) | INTRAVENOUS | Status: DC
  Administered 2022-03-16 – 2022-03-17 (×2): 3 mL via INTRAVENOUS

## 2022-03-15 MED ORDER — IOHEXOL 300 MG/ML  SOLN
100.0000 mL | Freq: Once | INTRAMUSCULAR | Status: AC | PRN
  Administered 2022-03-15: 100 mL via INTRAVENOUS

## 2022-03-15 MED ORDER — FENTANYL CITRATE PF 50 MCG/ML IJ SOSY
50.0000 ug | PREFILLED_SYRINGE | Freq: Once | INTRAMUSCULAR | Status: AC
  Administered 2022-03-15: 50 ug via INTRAVENOUS
  Filled 2022-03-15: qty 1

## 2022-03-15 MED ORDER — ONDANSETRON HCL 4 MG/2ML IJ SOLN
4.0000 mg | Freq: Once | INTRAMUSCULAR | Status: AC
  Administered 2022-03-15: 4 mg via INTRAVENOUS
  Filled 2022-03-15: qty 2

## 2022-03-15 MED ORDER — ACETAMINOPHEN 325 MG PO TABS
650.0000 mg | ORAL_TABLET | Freq: Four times a day (QID) | ORAL | Status: DC | PRN
  Administered 2022-03-16: 650 mg via ORAL
  Filled 2022-03-15: qty 2

## 2022-03-15 MED ORDER — ACETAMINOPHEN 650 MG RE SUPP: 650.0000 mg | Freq: Four times a day (QID) | RECTAL | Status: DC | PRN

## 2022-03-15 NOTE — Assessment & Plan Note (Addendum)
-   Patient presented with nausea, vomiting, abdominal pain.  Does have history of prior abdominal surgeries and history of prior partial obstruction -CT concerning for possible early partial SBO versus ileus - General surgery following, appreciate assistance -Tolerated clear liquids that were advanced to soft diet prior to discharge -Outpatient follow-up with surgery to discuss possible elective LOA

## 2022-03-15 NOTE — Assessment & Plan Note (Signed)
GERD/ H/O barrett's cont with iv ppi.

## 2022-03-15 NOTE — ED Notes (Signed)
Pt brought to ED 6 at this time, this RN now assuming care.

## 2022-03-15 NOTE — ED Notes (Signed)
ED Provider at bedside. 

## 2022-03-15 NOTE — ED Notes (Signed)
Patient transported back from CT 

## 2022-03-15 NOTE — Assessment & Plan Note (Addendum)
Continue lisinopril

## 2022-03-15 NOTE — H&P (Signed)
History and Physical    Chief Complaint: Abd pain- Partial SBO.   HISTORY OF PRESENT ILLNESS: [name] is an 81 y.o. female  with c/o mid epigastric abd pain Since last night. Partial SBO and has been recurrent . Pt has diarrhea and nausea. Ct shows ileus and SBO.pt has diarrhea and took imodium. Pt also has cystitis with abnormal urine and ct, symptoms 3 weeks ago of shingles and uti as well when she was given abx.   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  All other systems reviewed and are negative.     Allergies  Allergen Reactions   Buprenorphine Hcl Nausea And Vomiting   Dilaudid [Hydromorphone Hcl] Anxiety and Other (See Comments)    Hypotension    Morphine And Related Nausea And Vomiting     Past Surgical History:  Procedure Laterality Date   ABDOMINAL HYSTERECTOMY     APPENDECTOMY     BASAL CELL CARCINOMA EXCISION     on both sides of nose   BREAST BIOPSY Left 12/2013   hyperplasia   CATARACT EXTRACTION W/PHACO Right 01/10/2022   Procedure: CATARACT EXTRACTION PHACO AND INTRAOCULAR LENS PLACEMENT ([place]) RIGHT;  Surgeon: Birder Robson, MD;  Location: [place];  Service: Ophthalmology;  Laterality: Right;  7.85 0:38.1    CATARACT EXTRACTION W/PHACO Left 01/31/2022   Procedure: CATARACT EXTRACTION PHACO AND INTRAOCULAR LENS PLACEMENT ([facility]) LEFT 8.45 00:46.1;  Surgeon: Birder Robson, MD;  Location: [place];  Service: Ophthalmology;  Laterality: Left;   CHOLECYSTECTOMY     COLONOSCOPY N/A 09/16/2021   Procedure: COLONOSCOPY;  Surgeon: Lucilla Lame, MD;  Location: [place];  Service: Endoscopy;  Laterality: N/A;   COLONOSCOPY WITH PROPOFOL N/A 06/05/2017   Procedure: COLONOSCOPY WITH PROPOFOL;  Surgeon: Lollie Sails, MD;  Location: [facility] ENDOSCOPY;  Service: Endoscopy;  Laterality: N/A;   COLONOSCOPY WITH PROPOFOL N/A 05/14/2020   Procedure: COLONOSCOPY WITH PROPOFOL;  Surgeon: Lesly Rubenstein, MD;  Location: [facility] ENDOSCOPY;  Service: Endoscopy;  Laterality: N/A;   ESOPHAGOGASTRODUODENOSCOPY (EGD) WITH PROPOFOL N/A 03/30/2015   Procedure: ESOPHAGOGASTRODUODENOSCOPY (EGD) WITH PROPOFOL;  Surgeon: Lollie Sails, MD;  Location: [facility] ENDOSCOPY;  Service: Endoscopy;  Laterality: N/A;   ESOPHAGOGASTRODUODENOSCOPY (EGD) WITH PROPOFOL N/A 06/05/2017   Procedure: ESOPHAGOGASTRODUODENOSCOPY (EGD) WITH PROPOFOL;  Surgeon: Lollie Sails, MD;  Location: [facility] ENDOSCOPY;  Service: Endoscopy;  Laterality: N/A;   ESOPHAGOGASTRODUODENOSCOPY (EGD) WITH PROPOFOL N/A 05/14/2020   Procedure: ESOPHAGOGASTRODUODENOSCOPY (EGD) WITH PROPOFOL;  Surgeon: Lesly Rubenstein, MD;  Location: [facility] ENDOSCOPY;  Service: Endoscopy;  Laterality: N/A;   FACIAL COSMETIC SURGERY  history of multiple colonic polyps     lymph node removal from left groin     OOPHORECTOMY     VENTRAL HERNIA REPAIR N/A 10/02/2014   Procedure: HERNIA REPAIR VENTRAL ADULT;  Surgeon: Leonie Green, MD;  Location: [facility] ORS;  Service: General;  Laterality: N/A;  with mesh      Social History   Socioeconomic History   Marital status: Married    Spouse name: Not on file   Number of children: Not on file   Years of education:  Not on file   Highest education level: Not on file  Occupational History   Not on file  Tobacco Use   Smoking status: Never   Smokeless tobacco: Never  Vaping Use   Vaping Use: Never used  Substance and Sexual Activity   Alcohol use: Not Currently    Alcohol/week: 1.0 standard drink of alcohol    Types: 1 Cans of beer per week    Comment: rare social occasions only   Drug use: Never   Sexual activity: Never  Other Topics Concern   Not on file  Social History Narrative   Not on file   Social Determinants of Health   Financial Resource Strain: Not on file  Food Insecurity: Not on file  Transportation Needs: Not on file  Physical Activity: Not on file  Stress: Not on file  Social Connections: Not on file      CURRENT MEDS:    Current Facility-Administered Medications (Cardiovascular):    hydrALAZINE (APRESOLINE) injection 10 mg  Current Outpatient Medications (Cardiovascular):    lisinopril (PRINIVIL,ZESTRIL) 10 MG tablet, Take 10 mg by mouth daily.   rosuvastatin (CRESTOR) 5 MG tablet, Take 5 mg by mouth daily.   Current Outpatient Medications (Respiratory):    diphenhydrAMINE (BENADRYL) 25 MG tablet, Take 25 mg by mouth every 6 (six) hours as needed.   loratadine (CLARITIN) 10 MG tablet, Take 10 mg by mouth daily.  Current Facility-Administered Medications (Analgesics):    acetaminophen (TYLENOL) tablet 650 mg **OR** acetaminophen (TYLENOL) suppository 650 mg   fentaNYL (SUBLIMAZE) injection 25 mcg   Current Facility-Administered Medications (Hematological):    heparin injection 5,000 Units   Current Facility-Administered Medications (Other):    cefTRIAXone (ROCEPHIN) 2 g in sodium chloride 0.9 % 100 mL IVPB   sodium chloride flush (NS) 0.9 % injection 3 mL  Current Outpatient Medications (Other):    omeprazole (PRILOSEC) 20 MG capsule, Take 20 mg by mouth daily.   Probiotic Product (PROBIOTIC-10) CHEW, Chew by mouth.    ED Course: Pt in Ed patient is  alert awake oriented afebrile O2 sats of 97% on room air.  Vitals:   03/15/22 2201 03/15/22 2231 03/15/22 2300 03/16/22 0100  BP:  133/60 110/75 (!) 143/79  Pulse: 83 83 82 70  Resp:  '18 16 16  '$ Temp:  99.1 F (37.3 C)    TempSrc:  Oral    SpO2: 97% 97% 99% 96%  Weight:      Height:       Total I/O In: 100 [IV Piggyback:100] Out: -  SpO2: 96 % Blood work in ed shows: Blood work shows glucose of 123, normal CBC, abnormal urinalysis,  Results for orders placed or performed during the hospital encounter of 03/15/22 (from the past 48 hour(s))  Lipase, blood     Status: None   Collection Time: 03/15/22  6:01 PM  Result Value Ref Range   Lipase 29 11 - 51 U/L  Comment: Performed at [facility], [address] [place], [place] [address]  Comprehensive metabolic panel     Status: Abnormal   Collection Time: 03/15/22  6:01 PM  Result Value Ref Range   Sodium 136 135 - 145 mmol/L   Potassium 3.7 3.5 - 5.1 mmol/L   Chloride 106 98 - 111 mmol/L   CO2 23 22 - 32 mmol/L   Glucose, Bld 123 (H) 70 - 99 mg/dL    Comment: Glucose reference range applies only to samples taken after fasting for at least 8 hours.   BUN 10 8 - 23 mg/dL   Creatinine, Ser 0.74 0.44 - 1.00 mg/dL   Calcium 9.1 8.9 - 10.3 mg/dL   Total Protein 7.1 6.5 - 8.1 g/dL   Albumin 4.0 3.5 - 5.0 g/dL   AST 17 15 - 41 U/L   ALT 14 0 - 44 U/L   Alkaline Phosphatase 53 38 - 126 U/L   Total Bilirubin 1.0 0.3 - 1.2 mg/dL   GFR, Estimated >60 >60 mL/min    Comment: (NOTE) Calculated using the CKD-EPI Creatinine Equation (2021)    Anion gap 7 5 - 15    Comment: Performed at [facility], [address] [place], [place] [address]  CBC     Status: None   Collection Time: 03/15/22  6:01 PM  Result Value Ref Range   WBC 6.4 4.0 - 10.5 K/uL   RBC 4.73 3.87 - 5.11 MIL/uL   Hemoglobin 14.0 12.0 - 15.0 g/dL   HCT 40.7 36.0 - 46.0 %   MCV 86.0 80.0 - 100.0 fL   MCH 29.6 26.0 - 34.0 pg   MCHC 34.4 30.0 -  36.0 g/dL   RDW 13.6 11.5 - 15.5 %   Platelets 271 150 - 400 K/uL   nRBC 0.0 0.0 - 0.2 %    Comment: Performed at [facility], [address] [place],  [address]  Urinalysis, Routine w reflex microscopic     Status: Abnormal   Collection Time: 03/15/22  6:13 PM  Result Value Ref Range   Color, Urine YELLOW (A) YELLOW   APPearance CLEAR (A) CLEAR   Specific Gravity, Urine 1.008 1.005 - 1.030   pH 5.0 5.0 - 8.0   Glucose, UA NEGATIVE NEGATIVE mg/dL   Hgb urine dipstick SMALL (A) NEGATIVE   Bilirubin Urine NEGATIVE NEGATIVE   Ketones, ur NEGATIVE NEGATIVE mg/dL   Protein, ur NEGATIVE NEGATIVE mg/dL   Nitrite NEGATIVE NEGATIVE   Leukocytes,Ua MODERATE (A) NEGATIVE   RBC / HPF 0-5 0 - 5 RBC/hpf   WBC, UA 21-50 0 - 5 WBC/hpf   Bacteria, UA RARE (A) NONE SEEN   Squamous Epithelial / LPF 0-5 0 - 5   Mucus PRESENT     Comment: Performed at [facility], [address] [place],  [address]    In Ed pt received  Meds ordered this encounter  Medications   iohexol (OMNIPAQUE) 300 MG/ML solution 100 mL   fentaNYL (SUBLIMAZE) injection 50 mcg   ondansetron (ZOFRAN) injection 4 mg   cefTRIAXone (ROCEPHIN) 2 g in sodium chloride 0.9 % 100 mL IVPB    Order Specific Question:   Antibiotic Indication:    Answer:   UTI   cefTRIAXone (ROCEPHIN) 2 g in sodium chloride 0.9 % 100 mL IVPB    Order Specific Question:   Antibiotic Indication:    Answer:   UTI   heparin injection 5,000 Units  sodium chloride flush (NS) 0.9 % injection 3 mL   OR Linked Order Group    acetaminophen (TYLENOL) tablet 650 mg    acetaminophen (TYLENOL) suppository 650 mg   hydrALAZINE (APRESOLINE) injection 10 mg   fentaNYL (SUBLIMAZE) injection 25 mcg    Unresulted Labs (From admission, onward)     Start     Ordered   03/16/22 0500  Comprehensive metabolic panel  Tomorrow morning,   STAT        03/15/22 2322   03/16/22 0500  CBC  Tomorrow morning,   STAT        03/15/22 2322    03/15/22 2213  Urine Culture  Add-on,   AD       Question:  Indication  Answer:  Dysuria   03/15/22 2212           Admission Imaging : CT ABDOMEN PELVIS W CONTRAST  Result Date: 03/15/2022 CLINICAL DATA:  No bowel movements mid epigastric pain EXAM: CT ABDOMEN AND PELVIS WITH CONTRAST TECHNIQUE: Multidetector CT imaging of the abdomen and pelvis was performed using the standard protocol following bolus administration of intravenous contrast. RADIATION DOSE REDUCTION: This exam was performed according to the departmental dose-optimization program which includes automated exposure control, adjustment of the mA and/or kV according to patient size and/or use of iterative reconstruction technique. CONTRAST:  158m OMNIPAQUE IOHEXOL 300 MG/ML  SOLN COMPARISON:  CT 06/22/2021, 06/16/2021 FINDINGS: Lower chest: Lung bases demonstrate no acute airspace disease. Hepatobiliary: Multiple hepatic cysts. Liver granuloma. Status post cholecystectomy. No biliary dilatation Pancreas: Unremarkable. No pancreatic ductal dilatation or surrounding inflammatory changes. Spleen: Normal in size without focal abnormality. Adrenals/Urinary Tract: Adrenal glands are normal. Kidneys show no hydronephrosis. Cyst lower pole left kidney, no imaging follow-up is recommended. The bladder is slightly thick walled anteriorly. Stomach/Bowel: The stomach is nondistended. Proximal and mid small bowel is fluid-filled and mildly distended up to 3.9 cm. The distal small bowel is fluid-filled and less distended but there is no abrupt transition point identified. There is no acute bowel wall thickening. Vascular/Lymphatic: Mild aortic atherosclerosis. No aneurysm. No suspicious lymph nodes Reproductive: Status post hysterectomy. No adnexal masses. Other: Negative for pelvic effusion or free air. Mild hazy appearance of the central mesentery is slightly increased, negative for mass or adenopathy. Musculoskeletal: No acute osseous abnormality.  Vertebral hemangiomas T10 and L1. IMPRESSION: 1. Fluid-filled mildly distended proximal and mid small bowel with fluid-filled less distended distal small bowel but no abrupt transition point. Findings could be secondary to ileus or partial small bowel obstruction. Similar pattern as on previous exams. 2. 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 3. Slightly thick-walled appearance of the urinary bladder, correlate for cystitis. 4. Aortic atherosclerosis. Aortic Atherosclerosis (ICD10-I70.0). Electronically Signed   By: KDonavan FoilM.D.   On: 03/15/2022 22:29      Physical Examination: Vitals:   03/15/22 2201 03/15/22 2231 03/15/22 2300 03/16/22 0100  BP:  133/60 110/75 (!) 143/79  Pulse: 83 83 82 70  Temp:  99.1 F (37.3 C)    Resp:  '18 16 16  '$ Height:      Weight:      SpO2: 97% 97% 99% 96%  TempSrc:  Oral    BMI (Calculated):       Physical Exam Vitals and nursing note reviewed.  Constitutional:      General: She is not in acute distress.    Appearance: Normal appearance. She is not  ill-appearing, toxic-appearing or diaphoretic.  HENT:     Head: Normocephalic and atraumatic.     Right Ear: Hearing and external ear normal.     Left Ear: Hearing and external ear normal.     Nose: Nose normal. No nasal deformity.     Mouth/Throat:     Lips: Pink.     Mouth: Mucous membranes are moist.     Tongue: No lesions.  Eyes:     Extraocular Movements: Extraocular movements intact.     Pupils: Pupils are equal, round, and reactive to light.  Neck:     Vascular: No carotid bruit.  Cardiovascular:     Rate and Rhythm: Normal rate and regular rhythm.     Pulses: Normal pulses.     Heart sounds: Normal heart sounds.  Pulmonary:     Effort: Pulmonary effort is normal.     Breath sounds: Normal breath sounds.  Abdominal:     General: Bowel sounds are normal. There is distension.     Palpations: Abdomen is  soft. There is no mass.     Tenderness: There is abdominal tenderness. There is guarding.     Hernia: No hernia is present.  Musculoskeletal:     Right lower leg: No edema.     Left lower leg: No edema.  Skin:    General: Skin is warm.  Neurological:     General: No focal deficit present.     Mental Status: She is alert and oriented to person, place, and time.     Cranial Nerves: Cranial nerves 2-12 are intact.     Motor: Motor function is intact.  Psychiatric:        Attention and Perception: Attention normal.        Mood and Affect: Mood normal.        Speech: Speech normal.        Behavior: Behavior normal. Behavior is cooperative.        Cognition and Memory: Cognition normal.      Assessment and Plan: * Nausea, vomiting and diarrhea Pt has nausea/ vomiting and diarrhea episode.  Vomiting has resolved no NG tube currently. We will continue with supportive care and IV fluids and pain management and IV PPI.   Partial small bowel obstruction [facility]) General surgery has been consulted by ED provider. CT shows partial SBO.  Supportive care.  MIVF/ IVPP / IV morphine.   Essential hypertension Vitals:   03/15/22 1806 03/15/22 2231  BP: 130/89 133/60  Continue lisinopril.     Gastro-esophageal reflux disease without esophagitis GERD/ H/O barrett's cont with iv ppi.     DVT prophylaxis:  Heparin   Code Status:  Full code    Family Communication:   Athziri, Freundlich (Spouse) [phone]  Disposition Plan:  Home    Consults called:  General surgery: Dr.Rodenburg.   Admission status: Inpatient.    Unit/ Expected LOS: Med tele / los.     Para Skeans MD [facility]  6 PM- 2 AM. Please contact me via secure Chat 6 PM-2 AM. [phone] ( Pager ) To contact the [facility] Attending or Consulting provider [place] or covering provider during after hours [place], for this patient.   Check the care team in [facility] and look for a) attending/consulting [facility] provider  listed and b) the [facility] team listed Log into [email or web address] and use [place] universal password to access. If you do not have the password, please contact the hospital operator. Locate the  [place] provider you are looking for under [facility] and page to a number that you can be directly reached. If you still have difficulty reaching the provider, please page the [facility] (Director on Call) for the Hospitalists listed on amion for assistance. [email or web address] 03/16/2022, 1:20 AM

## 2022-03-15 NOTE — ED Triage Notes (Signed)
Pt with history of SBO.  Last bowel movement today.  Pt states she feels like she has an obstruction.  Mid epigastric abdominal pain since last night in the middle of the night.

## 2022-03-15 NOTE — ED Provider Notes (Signed)
[facility] Provider Note    Event Date/Time   First MD Initiated Contact with Patient 03/15/22 2151     (approximate)   History   Abdominal Pain   HPI  [name] is a 81 y.o. female with history of non-Hodgkin's lymphoma who comes in with concerns for pain.  Patient reports that starting last night she had multiple episodes of runny diarrhea.  She then took an Imodium and later on developed significant pain in her upper abdomen feelings that were similar to when she had a partial small bowel obstruction in the past.  She was admitted to the hospital team back in February for similar.  She denies having surgery before on it but just being managed conservatively.  She reports not tolerating NG tube well due to her small nasal passages and having significant amount of bleeding in the past.  She does report 3 weeks ago being treated for shingles and UTI so has had some antibiotics.  She denies any chest pain, shortness of breath or falls.   Physical Exam   Triage Vital Signs: ED Triage Vitals  Enc Vitals Group     BP 03/15/22 1806 130/89     Pulse Rate 03/15/22 1806 92     Resp 03/15/22 1806 18     Temp 03/15/22 1806 97.7 F (36.5 C)     Temp Source 03/15/22 1806 Oral     SpO2 03/15/22 1806 95 %     Weight 03/15/22 1806 165 lb (74.8 kg)     Height 03/15/22 1806 '5\' 2"'$  (1.575 m)     Head Circumference --      Peak Flow --      Pain Score 03/15/22 1811 7     Pain Loc --      Pain Edu? --      Excl. in [place]? --     Most recent vital signs: Vitals:   03/15/22 1806  BP: 130/89  Pulse: 92  Resp: 18  Temp: 97.7 F (36.5 C)  SpO2: 95%     General: Awake, no distress.  CV:  Good peripheral perfusion.  Resp:  Normal effort.  Abd:  Tender in the upper abdomen. Other:     ED Results / Procedures / Treatments   Labs (all labs ordered are listed, but only abnormal results are displayed) Labs Reviewed  COMPREHENSIVE METABOLIC PANEL - Abnormal;  Notable for the following components:      Result Value   Glucose, Bld 123 (*)    All other components within normal limits  URINALYSIS, ROUTINE W REFLEX MICROSCOPIC - Abnormal; Notable for the following components:   Color, Urine YELLOW (*)    APPearance CLEAR (*)    Hgb urine dipstick SMALL (*)    Leukocytes,Ua MODERATE (*)    Bacteria, UA RARE (*)    All other components within normal limits  LIPASE, BLOOD  CBC     EKG  My interpretation of EKG:  Normal sinus rhythm 91 without any ST elevation or T wave inversions, normal intervals  RADIOLOGY I have reviewed the CT personally interpreted and does look like patient has some distention possible small bowel   PROCEDURES:  Critical Care performed: No  .1-3 Lead EKG Interpretation  Performed by: Vanessa Suffolk, MD Authorized by: Vanessa [place], MD     Interpretation: normal     ECG rate:  90   ECG rate assessment: normal     Rhythm: sinus rhythm  Ectopy: none     Conduction: normal      MEDICATIONS ORDERED IN ED: Medications  cefTRIAXone (ROCEPHIN) 2 g in sodium chloride 0.9 % 100 mL IVPB (has no administration in time range)  iohexol (OMNIPAQUE) 300 MG/ML solution 100 mL (100 mLs Intravenous Contrast Given 03/15/22 2209)  fentaNYL (SUBLIMAZE) injection 50 mcg (50 mcg Intravenous Given 03/15/22 2228)  ondansetron (ZOFRAN) injection 4 mg (4 mg Intravenous Given 03/15/22 2228)     IMPRESSION / MDM / ASSESSMENT AND PLAN / ED COURSE  I reviewed the triage vital signs and the nursing notes.   Patient's presentation is most consistent with acute presentation with potential threat to life or bodily function.   Patient comes in with concerns for abdominal discomfort with history of partial small bowel obstructions in the past.  We will get a CT scan to further evaluate but this also could be related to the Imodium.  If she does have a bowel movement will consider sending for C. difficile testing, stool testing.  Patient  given some IV fentanyl IV Zofran to help with symptoms  Lipase is normal urine with possible signs of UTI CMP reassuring CBC reassuring  Patient CT scan did show concern for small bowel obstruction that was partial versus ileus given her symptoms we will discuss hospital team for admission.  There is also concern for some cystitis in her urine does look concerning we will start on some antibiotics.  Will discuss with the hospital team for admission I did send a message to Dr. Christian Mate for consultation  The patient is on the cardiac monitor to evaluate for evidence of arrhythmia and/or significant heart rate changes.      FINAL CLINICAL IMPRESSION(S) / ED DIAGNOSES   Final diagnoses:  Partial small bowel obstruction ([place])  Acute cystitis without hematuria     Rx / DC Orders   ED Discharge Orders     None        Note:  This document was prepared using Dragon voice recognition software and may include unintentional dictation errors.   Vanessa Pierz, MD 03/15/22 2244

## 2022-03-16 ENCOUNTER — Encounter: Payer: Self-pay | Admitting: Internal Medicine

## 2022-03-16 DIAGNOSIS — R112 Nausea with vomiting, unspecified: Secondary | ICD-10-CM | POA: Diagnosis not present

## 2022-03-16 DIAGNOSIS — Z85828 Personal history of other malignant neoplasm of skin: Secondary | ICD-10-CM | POA: Diagnosis not present

## 2022-03-16 DIAGNOSIS — N3000 Acute cystitis without hematuria: Secondary | ICD-10-CM | POA: Diagnosis present

## 2022-03-16 DIAGNOSIS — Z808 Family history of malignant neoplasm of other organs or systems: Secondary | ICD-10-CM | POA: Diagnosis not present

## 2022-03-16 DIAGNOSIS — K567 Ileus, unspecified: Secondary | ICD-10-CM | POA: Diagnosis present

## 2022-03-16 DIAGNOSIS — K56600 Partial intestinal obstruction, unspecified as to cause: Secondary | ICD-10-CM | POA: Diagnosis present

## 2022-03-16 DIAGNOSIS — K219 Gastro-esophageal reflux disease without esophagitis: Secondary | ICD-10-CM | POA: Diagnosis present

## 2022-03-16 DIAGNOSIS — Z833 Family history of diabetes mellitus: Secondary | ICD-10-CM | POA: Diagnosis not present

## 2022-03-16 DIAGNOSIS — Z8619 Personal history of other infectious and parasitic diseases: Secondary | ICD-10-CM | POA: Diagnosis not present

## 2022-03-16 DIAGNOSIS — Z9049 Acquired absence of other specified parts of digestive tract: Secondary | ICD-10-CM | POA: Diagnosis not present

## 2022-03-16 DIAGNOSIS — Z90710 Acquired absence of both cervix and uterus: Secondary | ICD-10-CM | POA: Diagnosis not present

## 2022-03-16 DIAGNOSIS — Z811 Family history of alcohol abuse and dependence: Secondary | ICD-10-CM | POA: Diagnosis not present

## 2022-03-16 DIAGNOSIS — R197 Diarrhea, unspecified: Secondary | ICD-10-CM | POA: Diagnosis not present

## 2022-03-16 DIAGNOSIS — E785 Hyperlipidemia, unspecified: Secondary | ICD-10-CM | POA: Diagnosis present

## 2022-03-16 DIAGNOSIS — Z8051 Family history of malignant neoplasm of kidney: Secondary | ICD-10-CM | POA: Diagnosis not present

## 2022-03-16 DIAGNOSIS — Z8249 Family history of ischemic heart disease and other diseases of the circulatory system: Secondary | ICD-10-CM | POA: Diagnosis not present

## 2022-03-16 DIAGNOSIS — Z888 Allergy status to other drugs, medicaments and biological substances status: Secondary | ICD-10-CM | POA: Diagnosis not present

## 2022-03-16 DIAGNOSIS — I10 Essential (primary) hypertension: Secondary | ICD-10-CM | POA: Diagnosis present

## 2022-03-16 DIAGNOSIS — Z8719 Personal history of other diseases of the digestive system: Secondary | ICD-10-CM | POA: Diagnosis not present

## 2022-03-16 DIAGNOSIS — Z823 Family history of stroke: Secondary | ICD-10-CM | POA: Diagnosis not present

## 2022-03-16 DIAGNOSIS — Z885 Allergy status to narcotic agent status: Secondary | ICD-10-CM | POA: Diagnosis not present

## 2022-03-16 DIAGNOSIS — Z8572 Personal history of non-Hodgkin lymphomas: Secondary | ICD-10-CM | POA: Diagnosis not present

## 2022-03-16 LAB — COMPREHENSIVE METABOLIC PANEL
ALT: 15 U/L (ref 0–44)
AST: 16 U/L (ref 15–41)
Albumin: 3.5 g/dL (ref 3.5–5.0)
Alkaline Phosphatase: 51 U/L (ref 38–126)
Anion gap: 5 (ref 5–15)
BUN: 10 mg/dL (ref 8–23)
CO2: 23 mmol/L (ref 22–32)
Calcium: 8.9 mg/dL (ref 8.9–10.3)
Chloride: 109 mmol/L (ref 98–111)
Creatinine, Ser: 0.65 mg/dL (ref 0.44–1.00)
GFR, Estimated: >60 mL/min (ref >60)
Glucose, Bld: 126 mg/dL — ABNORMAL HIGH (ref 70–99)
Potassium: 3.7 mmol/L (ref 3.5–5.1)
Sodium: 137 mmol/L (ref 135–145)
Total Bilirubin: 0.7 mg/dL (ref 0.3–1.2)
Total Protein: 6.5 g/dL (ref 6.5–8.1)

## 2022-03-16 LAB — CBC
HCT: 39.6 % (ref 36.0–46.0)
Hemoglobin: 13.8 g/dL (ref 12.0–15.0)
MCH: 30.1 pg (ref 26.0–34.0)
MCHC: 34.8 g/dL (ref 30.0–36.0)
MCV: 86.3 fL (ref 80.0–100.0)
Platelets: 233 10*3/uL (ref 150–400)
RBC: 4.59 MIL/uL (ref 3.87–5.11)
RDW: 13.5 % (ref 11.5–15.5)
WBC: 4.8 10*3/uL (ref 4.0–10.5)
nRBC: 0 % (ref 0.0–0.2)

## 2022-03-16 LAB — TYPE AND SCREEN
ABO/RH(D): O POS
Antibody Screen: NEGATIVE

## 2022-03-16 LAB — PHOSPHORUS: Phosphorus: 4.2 mg/dL (ref 2.5–4.6)

## 2022-03-16 LAB — MAGNESIUM: Magnesium: 1.9 mg/dL (ref 1.7–2.4)

## 2022-03-16 MED ORDER — SODIUM CHLORIDE 0.9 % IV SOLN: INTRAVENOUS | Status: DC

## 2022-03-16 MED ORDER — FENTANYL CITRATE PF 50 MCG/ML IJ SOSY
25.0000 ug | PREFILLED_SYRINGE | INTRAMUSCULAR | Status: DC | PRN
  Administered 2022-03-16 (×2): 25 ug via INTRAVENOUS
  Filled 2022-03-16 (×2): qty 1

## 2022-03-16 NOTE — ED Notes (Signed)
Informed rn bed assigned 

## 2022-03-16 NOTE — Progress Notes (Signed)
Brief Progress Note Seen the same morning of initial consultation; no charge  Report feeling better; still with mild upper abdominal soreness No fever, chills, nausea, emesis She is passing flatus  Labs are reassuring  Exam:  BP 118/69   Pulse 72   Temp 99.1 F (37.3 C) (Oral)   Resp 16   Ht '5\' 2"'$  (1.575 m)   Wt 74.8 kg   SpO2 96%   BMI 30.18 kg/m   Const: Resting comfortably; NAD Abd: Soft, mild tenderness in upper abdomen, non-distended, no rebound/guarding. She is certainly without peritonitis   Plan:  Okay to initiate CLD; monitor tolerance No role for NGT at this time. Should N/V develop we may need to consider this No role for surgical intervention currently. Should she fail to improve or clinically deteriorate, we may need to consider this sooner Monitor abdominal examination Pain control prn (minimize narcotics) Antiemetics prn Weill get morning KUB tomorrow Mobilize as tolerated Further management per primary service; we ill follow  Dr Christian Mate at bedside this morning as well   -- Edison Simon, PA-C [facility] 03/16/2022, 8:19 AM M-F: 7am - 4pm

## 2022-03-16 NOTE — Plan of Care (Signed)

## 2022-03-16 NOTE — ED Notes (Signed)
Pt husband brought shower supplies, pt currently taking "spit bath".

## 2022-03-16 NOTE — Progress Notes (Signed)
Progress Note    [name]   VZC:[id number]  DOB: May 11, 1940  DOA: 03/15/2022     0 PCP: Marinda Elk, MD  Initial CC: abd pain, N/V  Hospital Course: [name] is an 81 yo female with PMH NHL, prior multiple abdominal surgeries, GERD, Barrett's esophagus, HTN, HLD who presented with nausea, vomiting, abdominal pain.  She also had a brief episode of diarrhea prior to hospitalization for which she took Imodium for. She underwent CT abdomen/pelvis on admission which showed distended proximal and mid small bowel with concern for possible obstruction or ileus.  General surgery was consulted on admission as well.  Interval History:  Seen this morning in the ER.  Still having some abdominal bloating/discomfort but was attempting to drink some coffee when seen.  Assessment and Plan: * Partial small bowel obstruction ([place]) - Patient presented with nausea, vomiting, abdominal pain.  Does have history of prior abdominal surgeries and history of prior partial obstruction -CT concerning for possible early partial SBO versus ileus - General surgery following, appreciate assistance - Diet advanced to clear liquids today per surgery, monitor tolerance - Continue fluids, pain control, nausea control  Nausea, vomiting and diarrhea Pt has nausea/ vomiting and diarrhea episode.  Vomiting has resolved no NG tube currently. We will continue with supportive care and IV fluids and pain management and IV PPI.   Acute cystitis - UA noted with moderate LE, negative nitrite, 21-50 WBC, rare bacteria -CT also noted with mildly thickened bladder - Continue Rocephin - Follow-up urine culture  Essential hypertension - Continue lisinopril    Gastro-esophageal reflux disease without esophagitis - Continue PPI   Old records reviewed in assessment of this patient  Antimicrobials: Rocephin 03/15/2022 >> current  DVT prophylaxis:  heparin injection 5,000 Units Start: 03/15/22  2330   Code Status:   Code Status: Full Code  Mobility Assessment (last 72 hours)     Mobility Assessment     Row Name 03/16/22 0200           Does patient have an order for bedrest or is patient medically unstable No - Continue assessment       What is the highest level of mobility based on the progressive mobility assessment? Level 6 (Walks independently in room and hall) - Balance while walking in room without assist - Complete                Barriers to discharge:  Disposition Plan: Home Status is: Observation  Objective: Blood pressure (!) 111/97, pulse 74, temperature 98.6 F (37 C), temperature source Oral, resp. rate 18, height '5\' 2"'$  (1.575 m), weight 74.8 kg, SpO2 95 %.  Examination:  Physical Exam Constitutional:      General: She is not in acute distress.    Appearance: She is well-developed.  HENT:     Head: Normocephalic and atraumatic.     Mouth/Throat:     Mouth: Mucous membranes are moist.  Eyes:     Extraocular Movements: Extraocular movements intact.  Cardiovascular:     Rate and Rhythm: Normal rate and regular rhythm.  Pulmonary:     Effort: Pulmonary effort is normal.     Breath sounds: Normal breath sounds.  Abdominal:     General: Bowel sounds are normal. There is no distension.     Palpations: Abdomen is soft.     Tenderness: There is no abdominal tenderness.  Musculoskeletal:        General: Normal range of motion.  Cervical back: Normal range of motion and neck supple.  Skin:    General: Skin is warm and dry.  Neurological:     General: No focal deficit present.     Mental Status: She is alert and oriented to person, place, and time.  Psychiatric:        Mood and Affect: Mood normal.        Behavior: Behavior normal.      Consultants:  General surgery  Procedures:    Data Reviewed: Results for orders placed or performed during the hospital encounter of 03/15/22 (from the past 24 hour(s))  Lipase, blood     Status:  None   Collection Time: 03/15/22  6:01 PM  Result Value Ref Range   Lipase 29 11 - 51 U/L  Comprehensive metabolic panel     Status: Abnormal   Collection Time: 03/15/22  6:01 PM  Result Value Ref Range   Sodium 136 135 - 145 mmol/L   Potassium 3.7 3.5 - 5.1 mmol/L   Chloride 106 98 - 111 mmol/L   CO2 23 22 - 32 mmol/L   Glucose, Bld 123 (H) 70 - 99 mg/dL   BUN 10 8 - 23 mg/dL   Creatinine, Ser 0.74 0.44 - 1.00 mg/dL   Calcium 9.1 8.9 - 10.3 mg/dL   Total Protein 7.1 6.5 - 8.1 g/dL   Albumin 4.0 3.5 - 5.0 g/dL   AST 17 15 - 41 U/L   ALT 14 0 - 44 U/L   Alkaline Phosphatase 53 38 - 126 U/L   Total Bilirubin 1.0 0.3 - 1.2 mg/dL   GFR, Estimated >60 >60 mL/min   Anion gap 7 5 - 15  CBC     Status: None   Collection Time: 03/15/22  6:01 PM  Result Value Ref Range   WBC 6.4 4.0 - 10.5 K/uL   RBC 4.73 3.87 - 5.11 MIL/uL   Hemoglobin 14.0 12.0 - 15.0 g/dL   HCT 40.7 36.0 - 46.0 %   MCV 86.0 80.0 - 100.0 fL   MCH 29.6 26.0 - 34.0 pg   MCHC 34.4 30.0 - 36.0 g/dL   RDW 13.6 11.5 - 15.5 %   Platelets 271 150 - 400 K/uL   nRBC 0.0 0.0 - 0.2 %  Urinalysis, Routine w reflex microscopic     Status: Abnormal   Collection Time: 03/15/22  6:13 PM  Result Value Ref Range   Color, Urine YELLOW (A) YELLOW   APPearance CLEAR (A) CLEAR   Specific Gravity, Urine 1.008 1.005 - 1.030   pH 5.0 5.0 - 8.0   Glucose, UA NEGATIVE NEGATIVE mg/dL   Hgb urine dipstick SMALL (A) NEGATIVE   Bilirubin Urine NEGATIVE NEGATIVE   Ketones, ur NEGATIVE NEGATIVE mg/dL   Protein, ur NEGATIVE NEGATIVE mg/dL   Nitrite NEGATIVE NEGATIVE   Leukocytes,Ua MODERATE (A) NEGATIVE   RBC / HPF 0-5 0 - 5 RBC/hpf   WBC, UA 21-50 0 - 5 WBC/hpf   Bacteria, UA RARE (A) NONE SEEN   Squamous Epithelial / LPF 0-5 0 - 5   Mucus PRESENT   Comprehensive metabolic panel     Status: Abnormal   Collection Time: 03/16/22  5:42 AM  Result Value Ref Range   Sodium 137 135 - 145 mmol/L   Potassium 3.7 3.5 - 5.1 mmol/L   Chloride  109 98 - 111 mmol/L   CO2 23 22 - 32 mmol/L   Glucose, Bld 126 (H) 70 - 99 mg/dL  BUN 10 8 - 23 mg/dL   Creatinine, Ser 0.65 0.44 - 1.00 mg/dL   Calcium 8.9 8.9 - 10.3 mg/dL   Total Protein 6.5 6.5 - 8.1 g/dL   Albumin 3.5 3.5 - 5.0 g/dL   AST 16 15 - 41 U/L   ALT 15 0 - 44 U/L   Alkaline Phosphatase 51 38 - 126 U/L   Total Bilirubin 0.7 0.3 - 1.2 mg/dL   GFR, Estimated >60 >60 mL/min   Anion gap 5 5 - 15  CBC     Status: None   Collection Time: 03/16/22  5:42 AM  Result Value Ref Range   WBC 4.8 4.0 - 10.5 K/uL   RBC 4.59 3.87 - 5.11 MIL/uL   Hemoglobin 13.8 12.0 - 15.0 g/dL   HCT 39.6 36.0 - 46.0 %   MCV 86.3 80.0 - 100.0 fL   MCH 30.1 26.0 - 34.0 pg   MCHC 34.8 30.0 - 36.0 g/dL   RDW 13.5 11.5 - 15.5 %   Platelets 233 150 - 400 K/uL   nRBC 0.0 0.0 - 0.2 %  Magnesium     Status: None   Collection Time: 03/16/22  5:42 AM  Result Value Ref Range   Magnesium 1.9 1.7 - 2.4 mg/dL  Phosphorus     Status: None   Collection Time: 03/16/22  5:42 AM  Result Value Ref Range   Phosphorus 4.2 2.5 - 4.6 mg/dL  Type and screen     Status: None   Collection Time: 03/16/22  5:42 AM  Result Value Ref Range   ABO/RH(D) O POS    Antibody Screen NEG    Sample Expiration      03/19/2022,2359 Performed at [facility], [address] [place], [place] [address]     I have Reviewed nursing notes, Vitals, and Lab results since pt's last encounter. Pertinent lab results : see above I have ordered test including BMP, CBC, Mg I have reviewed the last note from staff over past 24 hours I have discussed pt's care plan and test results with nursing staff, case manager   LOS: 0 days   Dwyane Dee, MD [facility] 03/16/2022, 1:51 PM

## 2022-03-16 NOTE — Consult Note (Signed)
[facility] SURGICAL CONSULTATION NOTE (initial) - cpt: 99254   HISTORY OF PRESENT ILLNESS (HPI):  81 y.o. female presented to [facility] ED yesterday for evaluation of symptoms she is familiar with, having to prior episodes last February. Patient reports voiding and having multiple small bowel movements.  She denies any vomiting and has had some epigastric crampy abdominal pain.  Prior surgical history includes mesenteric lymph node biopsy for which she was diagnosed with non-Hodgkin's lymphoma, total hysterectomy, appendectomy, cholecystectomy, and a ventral hernia repair. She sees Dr. Allen Norris from gastroenterology.  Surgery is consulted by ED physician Dr. Jari Pigg in this context for evaluation and management of partial small bowel obstruction.  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 ([place] [address]) 09/13/2014   Non Hodgkin's lymphoma ([place])    Non Hodgkin's lymphoma ([place])    RAD (reactive airway disease)    Skin cancer      PAST SURGICAL HISTORY ([place]):  Past Surgical History:  Procedure Laterality Date   ABDOMINAL HYSTERECTOMY     APPENDECTOMY     BASAL CELL CARCINOMA EXCISION     on both sides of nose   BREAST BIOPSY Left 12/2013   hyperplasia   CATARACT EXTRACTION W/PHACO Right 01/10/2022   Procedure: CATARACT EXTRACTION PHACO AND INTRAOCULAR LENS PLACEMENT ([place]) RIGHT;  Surgeon: Birder Robson, MD;  Location: [facility];  Service: Ophthalmology;  Laterality: Right;   7.85 0:38.1   CATARACT EXTRACTION W/PHACO Left 01/31/2022   Procedure: CATARACT EXTRACTION PHACO AND INTRAOCULAR LENS PLACEMENT ([facility]) LEFT 8.45 00:46.1;  Surgeon: Birder Robson, MD;  Location: [facility];  Service: Ophthalmology;  Laterality: Left;   CHOLECYSTECTOMY     COLONOSCOPY N/A 09/16/2021   Procedure: COLONOSCOPY;  Surgeon: Lucilla Lame, MD;  Location: [facility];  Service: Endoscopy;  Laterality: N/A;   COLONOSCOPY WITH PROPOFOL N/A 06/05/2017   Procedure: COLONOSCOPY WITH PROPOFOL;  Surgeon: Lollie Sails, MD;  Location: [facility] ENDOSCOPY;  Service: Endoscopy;  Laterality: N/A;   COLONOSCOPY WITH PROPOFOL N/A 05/14/2020   Procedure: COLONOSCOPY WITH PROPOFOL;  Surgeon: Lesly Rubenstein, MD;  Location: [facility] ENDOSCOPY;  Service: Endoscopy;  Laterality: N/A;   ESOPHAGOGASTRODUODENOSCOPY (EGD) WITH PROPOFOL N/A 03/30/2015   Procedure: ESOPHAGOGASTRODUODENOSCOPY (EGD) WITH PROPOFOL;  Surgeon: Lollie Sails, MD;  Location: [facility] ENDOSCOPY;  Service: Endoscopy;  Laterality: N/A;   ESOPHAGOGASTRODUODENOSCOPY (EGD) WITH PROPOFOL N/A 06/05/2017   Procedure: ESOPHAGOGASTRODUODENOSCOPY (EGD) WITH PROPOFOL;  Surgeon: Lollie Sails, MD;  Location[facility] END[facility]COPY;  Service: Endoscopy;  Laterality: N/A;   ESOPHAGOGASTRODUODENOSCOPY (EGD) WITH PROPOFOL N/A 05/14/2020   Procedure: ESOPHAGOGASTRODUODENOSCOPY (EGD) WITH PROPOFOL;  Surgeon: Lesly Rubenstein, MD;  Location: [facility] ENDOSCOPY;  Service: Endoscopy;  Laterality: N/A;   FACIAL COSMETIC SURGERY     history of multiple colonic polyps     lymph node removal from left groin     OOPHORECTOMY  VENTRAL HERNIA REPAIR N/A 10/02/2014   Procedure: HERNIA REPAIR VENTRAL ADULT;  Surgeon: Leonie Green, MD;  Location: [facility] ORS;  Service: General;  Laterality: N/A;  with mesh     MEDICATIONS:  Prior to Admission medications   Medication Sig Start Date End Date Taking? Authorizing Provider  diphenhydrAMINE (BENADRYL)  25 MG tablet Take 25 mg by mouth every 6 (six) hours as needed.    [provider]  lisinopril (PRINIVIL,ZESTRIL) 10 MG tablet Take 10 mg by mouth daily. 09/21/16   [provider]  loratadine (CLARITIN) 10 MG tablet Take 10 mg by mouth daily.    [provider]  omeprazole (PRILOSEC) 20 MG capsule Take 20 mg by mouth daily. 06/08/15   [provider]  Probiotic Product (PROBIOTIC-10) CHEW Chew by mouth.    [provider]  rosuvastatin (CRESTOR) 5 MG tablet Take 5 mg by mouth daily. 08/12/20 03/01/22  [provider]     ALLERGIES:  Allergies  Allergen Reactions   Buprenorphine Hcl Nausea And Vomiting   Dilaudid [Hydromorphone Hcl] Anxiety and Other (See Comments)    Hypotension    Morphine And Related Nausea And Vomiting     SOCIAL HISTORY:  Social History   Socioeconomic History   Marital status: Married    Spouse name: Not on file   Number of children: Not on file   Years of education: Not on file   Highest education level: Not on file  Occupational History   Not on file  Tobacco Use   Smoking status: Never   Smokeless tobacco: Never  Vaping Use   Vaping Use: Never used  Substance and Sexual Activity   Alcohol use: Not Currently    Alcohol/week: 1.0 standard drink of alcohol    Types: 1 Cans of beer per week    Comment: rare social occasions only   Drug use: Never   Sexual activity: Never  Other Topics Concern   Not on file  Social History Narrative   Not on file   Social Determinants of Health   Financial Resource Strain: Not on file  Food Insecurity: Not on file  Transportation Needs: Not on file  Physical Activity: Not on file  Stress: Not on file  Social Connections: Not on file  Intimate Partner Violence: Not on file     FAMILY HISTORY:  Family History  Problem Relation Age of Onset   Alcohol abuse Father    Stroke Father    Diabetes Sister    Kidney cancer Sister    Melanoma Brother    Diabetes  Brother    Heart attack Brother    Breast cancer Neg Hx    Bladder Cancer Neg Hx    Prostate cancer Neg Hx       REVIEW OF SYSTEMS:  Review of Systems  Constitutional:  Negative for chills and fever.  HENT: Negative.    Eyes: Negative.   Respiratory:  Negative for cough.   Cardiovascular:  Negative for chest pain.  Gastrointestinal:  Positive for abdominal pain, diarrhea and nausea.  Skin: Negative.   Neurological: Negative.   Psychiatric/Behavioral: Negative.      VITAL SIGNS:  Temp:  [97.7 F (36.5 C)-99.1 F (37.3 C)] 99.1 F (37.3 C) (11/08 2231) Pulse Rate:  [82-92] 82 (11/08 2300) Resp:  [16-18] 16 (11/08 2300) BP: (110-133)/(60-89) 110/75 (11/08 2300) SpO2:  [95 %-99 %] 99 % (11/08 2300) Weight:  [74.8 kg] 74.8 kg (11/08 1806)     Height:  $'5\' 2"'E$  (157.5 cm) Weight: 74.8 kg BMI (Calculated): 30.17   INTAKE/OUTPUT:  11/08 0701 - 11/09 0700 In: 100 [IV Piggyback:100] Out: -   PHYSICAL EXAM:  Physical Exam Blood pressure 110/75, pulse 82, temperature 99.1 F (37.3 C), temperature source Oral, resp. rate 16, height '5\' 2"'$  (1.575 m), weight 74.8 kg, SpO2 99 %. Last Weight  Most recent update: 03/15/2022  6:07 PM    Weight  74.8 kg (165 lb)             CONSTITUTIONAL: Well developed, and nourished, appropriately responsive and aware without distress.   EYES: Sclera non-icteric.   EARS, NOSE, MOUTH AND THROAT:  The oropharynx is clear. Oral mucosa is pink and moist.    Hearing is intact to voice.  NECK: Trachea is midline, and there is no jugular venous distension.  LYMPH NODES:  Lymph nodes in the neck were not appreciated. RESPIRATORY:  Lungs are clear, and breath sounds are equal bilaterally. Normal respiratory effort without pathologic use of accessory muscles. CARDIOVASCULAR: Heart is regular in rate and rhythm. GI: The abdomen is subjectively tender in the epigastrium to palpation, otherwise soft, nontender, and nondistended. There were no palpable masses. I  did not appreciate hepatosplenomegaly.  MUSCULOSKELETAL:  Symmetrical muscle tone appreciated in all four extremities.    SKIN: Skin turgor is normal. No pathologic skin lesions appreciated.  NEUROLOGIC:  Motor and sensation appear grossly normal.  Cranial nerves are grossly without defect. PSYCH:  Alert and oriented to person, place and time. Affect is appropriate for situation.  Data Reviewed I have personally reviewed what is currently available of the patient's imaging, recent labs and medical records.    Labs:     Latest Ref Rng & Units 03/15/2022    6:01 PM 03/01/2022    2:33 PM 08/26/2021    9:56 AM  CBC  WBC 4.0 - 10.5 K/uL 6.4  5.7  5.5   Hemoglobin 12.0 - 15.0 g/dL 14.0  14.0  13.4   Hematocrit 36.0 - 46.0 % 40.7  41.7  39.6   Platelets 150 - 400 K/uL 271  245  257       Latest Ref Rng & Units 03/15/2022    6:01 PM 03/01/2022    2:33 PM 08/26/2021    9:56 AM  CMP  Glucose 70 - 99 mg/dL 123  107  96   BUN 8 - 23 mg/dL '10  16  16   '$ Creatinine 0.44 - 1.00 mg/dL 0.74  0.92  0.85   Sodium 135 - 145 mmol/L 136  139  139   Potassium 3.5 - 5.1 mmol/L 3.7  3.9  3.7   Chloride 98 - 111 mmol/L 106  106  108   CO2 22 - 32 mmol/L '23  26  24   '$ Calcium 8.9 - 10.3 mg/dL 9.1  9.4  9.0   Total Protein 6.5 - 8.1 g/dL 7.1  7.6  7.2   Total Bilirubin 0.3 - 1.2 mg/dL 1.0  0.4  0.6   Alkaline Phos 38 - 126 U/L 53  58  56   AST 15 - 41 U/L '17  21  25   '$ ALT 0 - 44 U/L '14  20  19      '$ Imaging studies:   Last 24 hrs: CT ABDOMEN PELVIS W CONTRAST  Result Date: 03/15/2022 CLINICAL DATA:  No bowel movements mid epigastric pain EXAM: CT ABDOMEN AND PELVIS WITH CONTRAST TECHNIQUE: Multidetector CT imaging of the abdomen and  pelvis was performed using the standard protocol following bolus administration of intravenous contrast. RADIATION DOSE REDUCTION: This exam was performed according to the departmental dose-optimization program which includes automated exposure control, adjustment of the mA  and/or kV according to patient size and/or use of iterative reconstruction technique. CONTRAST:  119m OMNIPAQUE IOHEXOL 300 MG/ML  SOLN COMPARISON:  CT 06/22/2021, 06/16/2021 FINDINGS: Lower chest: Lung bases demonstrate no acute airspace disease. Hepatobiliary: Multiple hepatic cysts. Liver granuloma. Status post cholecystectomy. No biliary dilatation Pancreas: Unremarkable. No pancreatic ductal dilatation or surrounding inflammatory changes. Spleen: Normal in size without focal abnormality. Adrenals/Urinary Tract: Adrenal glands are normal. Kidneys show no hydronephrosis. Cyst lower pole left kidney, no imaging follow-up is recommended. The bladder is slightly thick walled anteriorly. Stomach/Bowel: The stomach is nondistended. Proximal and mid small bowel is fluid-filled and mildly distended up to 3.9 cm. The distal small bowel is fluid-filled and less distended but there is no abrupt transition point identified. There is no acute bowel wall thickening. Vascular/Lymphatic: Mild aortic atherosclerosis. No aneurysm. No suspicious lymph nodes Reproductive: Status post hysterectomy. No adnexal masses. Other: Negative for pelvic effusion or free air. Mild hazy appearance of the central mesentery is slightly increased, negative for mass or adenopathy. Musculoskeletal: No acute osseous abnormality. Vertebral hemangiomas T10 and L1. IMPRESSION: 1. Fluid-filled mildly distended proximal and mid small bowel with fluid-filled less distended distal small bowel but no abrupt transition point. Findings could be secondary to ileus or partial small bowel obstruction. Similar pattern as on previous exams. 2. 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 3. Slightly thick-walled appearance of the urinary bladder, correlate for cystitis. 4. Aortic atherosclerosis. Aortic Atherosclerosis (ICD10-I70.0). Electronically Signed   By: KDonavan FoilM.D.   On:  03/15/2022 22:29     Assessment/Plan:  81y.o. 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 ([facility] 02/24/2016   Ingrown toenail 08/03/2015   S/P nail surgery 08/03/2015   Mammogram abnormal 12/29/2014   Screening mammogram for high-risk patient 12/29/2014   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 ([facility] 09/13/2014   Allergic rhinitis 06/02/2014   Barrett esophagus 06/02/2014   Benign essential tremor 06/02/2014   Bronchitis, chronic ([facility] 06/02/2014   Arthritis, degenerative 06/02/2014   Lymphoma, non-Hodgkin's ([facility] 06/02/2014   RAD (reactive airway disease) 06/02/2014   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   Chronic bronchitis ([facility] 06/02/2014   Non-[place]kin's lymphoma ([facility] 06/02/2014   Gastro-esophageal reflux disease without esophagitis 06/02/2014   HLD (hyperlipidemia) 12/10/2013   Adenitis, salivary, 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  General surgery will follow with you.  -I believe she may have ice chips, but otherwise NPO.  -KUB in a.m.  - DVT prophylaxis  All of the above findings and recommendations were discussed  with the patient and  family(if present), and all of patient's and present  family's questions were answered to their expressed satisfaction.  Thank you for the opportunity to participate in this patient's care.   -- Ronny Bacon, M.D., FACS 03/16/2022, 12:51 AM

## 2022-03-16 NOTE — Hospital Course (Addendum)
[name] is an 81 yo female with PMH NHL, prior multiple abdominal surgeries, GERD, Barrett's esophagus, HTN, HLD who presented with nausea, vomiting, abdominal pain.  She also had a brief episode of diarrhea prior to hospitalization for which she took Imodium for. She underwent CT abdomen/pelvis on admission which showed distended proximal and mid small bowel with concern for possible obstruction or ileus.  General surgery was consulted on admission as well. Symptoms slowly improved and she was started on trial of clear liquid diet.  She tolerated this well and was advanced to soft diet which she also tolerated well. Serial abdominal x-ray showed improvement in gaseous distention as well.  She was considered stable for discharging home per surgery with outpatient follow-up to discuss possible elective lysis of adhesions.

## 2022-03-16 NOTE — Assessment & Plan Note (Addendum)
-   UA noted with moderate LE, negative nitrite, 21-50 WBC, rare bacteria - Ucx grew GBS -CT also noted with mildly thickened bladder -Completed 3-day course of antibiotics in hospital prior to discharge

## 2022-03-17 ENCOUNTER — Inpatient Hospital Stay: Payer: Medicare Other

## 2022-03-17 DIAGNOSIS — K56600 Partial intestinal obstruction, unspecified as to cause: Secondary | ICD-10-CM | POA: Diagnosis not present

## 2022-03-17 LAB — CBC WITH DIFFERENTIAL/PLATELET
Abs Immature Granulocytes: 0.01 10*3/uL (ref 0.00–0.07)
Basophils Absolute: 0 10*3/uL (ref 0.0–0.1)
Basophils Relative: 0 %
Eosinophils Absolute: 0.1 10*3/uL (ref 0.0–0.5)
Eosinophils Relative: 2 %
HCT: 36.9 % (ref 36.0–46.0)
Hemoglobin: 12.7 g/dL (ref 12.0–15.0)
Immature Granulocytes: 0 %
Lymphocytes Relative: 52 %
Lymphs Abs: 1.7 10*3/uL (ref 0.7–4.0)
MCH: 29.7 pg (ref 26.0–34.0)
MCHC: 34.4 g/dL (ref 30.0–36.0)
MCV: 86.2 fL (ref 80.0–100.0)
Monocytes Absolute: 0.5 10*3/uL (ref 0.1–1.0)
Monocytes Relative: 14 %
Neutro Abs: 1.1 10*3/uL — ABNORMAL LOW (ref 1.7–7.7)
Neutrophils Relative %: 32 %
Platelets: 215 10*3/uL (ref 150–400)
RBC: 4.28 MIL/uL (ref 3.87–5.11)
RDW: 13.4 % (ref 11.5–15.5)
WBC: 3.4 10*3/uL — ABNORMAL LOW (ref 4.0–10.5)
nRBC: 0 % (ref 0.0–0.2)

## 2022-03-17 LAB — BASIC METABOLIC PANEL
Anion gap: 5 (ref 5–15)
BUN: 10 mg/dL (ref 8–23)
CO2: 24 mmol/L (ref 22–32)
Calcium: 8.6 mg/dL — ABNORMAL LOW (ref 8.9–10.3)
Chloride: 112 mmol/L — ABNORMAL HIGH (ref 98–111)
Creatinine, Ser: 0.7 mg/dL (ref 0.44–1.00)
GFR, Estimated: >60 mL/min (ref >60)
Glucose, Bld: 90 mg/dL (ref 70–99)
Potassium: 3.9 mmol/L (ref 3.5–5.1)
Sodium: 141 mmol/L (ref 135–145)

## 2022-03-17 LAB — MAGNESIUM: Magnesium: 2 mg/dL (ref 1.7–2.4)

## 2022-03-17 LAB — URINE CULTURE: Culture: 30000 — AB

## 2022-03-17 MED ORDER — CEFADROXIL 500 MG PO CAPS
1000.0000 mg | ORAL_CAPSULE | Freq: Once | ORAL | Status: AC
  Administered 2022-03-17: 1000 mg via ORAL
  Filled 2022-03-17: qty 2

## 2022-03-17 NOTE — Discharge Summary (Signed)
Physician Discharge Summary   [name] EPP:[id number] DOB: [birth year] DOA: 03/15/2022  PCP: Marinda Elk, MD  Admit date: 03/15/2022 Discharge date: 03/17/2022  Barriers to discharge: none  Admitted From: Home Disposition:  Home Discharging physician: Dwyane Dee, MD  Recommendations for Outpatient Follow-up:  Follow-up with surgery  Home Health:  Equipment/Devices:   Discharge Condition: stable CODE STATUS: Full Diet recommendation:  Diet Orders (From admission, onward)     Start     Ordered   03/17/22 0818  DIET SOFT Room service appropriate? Yes; Fluid consistency: Thin  Diet effective now       Question Answer Comment  Room service appropriate? Yes   Fluid consistency: Thin      03/17/22 0817            Hospital Course: [name] is an 81 yo female with PMH NHL, prior multiple abdominal surgeries, GERD, Barrett's esophagus, HTN, HLD who presented with nausea, vomiting, abdominal pain.  She also had a brief episode of diarrhea prior to hospitalization for which she took Imodium for. She underwent CT abdomen/pelvis on admission which showed distended proximal and mid small bowel with concern for possible obstruction or ileus.  General surgery was consulted on admission as well. Symptoms slowly improved and she was started on trial of clear liquid diet.  She tolerated this well and was advanced to soft diet which she also tolerated well. Serial abdominal x-ray showed improvement in gaseous distention as well.  She was considered stable for discharging home per surgery with outpatient follow-up to discuss possible elective lysis of adhesions.  Assessment and Plan: * Partial small bowel obstruction (HCC)-resolved as of 03/17/2022 - Patient presented with nausea, vomiting, abdominal pain.  Does have history of prior abdominal surgeries and history of prior partial obstruction -CT concerning for possible early partial SBO versus ileus - General surgery  following, appreciate assistance -Tolerated clear liquids that were advanced to soft diet prior to discharge -Outpatient follow-up with surgery to discuss possible elective LOA  Essential hypertension - Continue lisinopril  Gastro-esophageal reflux disease without esophagitis - Continue PPI  Acute cystitis-resolved as of 03/17/2022 - UA noted with moderate LE, negative nitrite, 21-50 WBC, rare bacteria - Ucx grew GBS -CT also noted with mildly thickened bladder -Completed 3-day course of antibiotics in hospital prior to discharge       The patient's chronic medical conditions were treated accordingly per the patient's home medication regimen except as noted.  On day of discharge, patient was felt deemed stable for discharge. Patient/family member advised to call PCP or come back to ER if needed.   Principal Diagnosis: Partial small bowel obstruction [facility])  Discharge Diagnoses: Active Hospital Problems   Diagnosis Date Noted   Essential hypertension 06/16/2021   Gastro-esophageal reflux disease without esophagitis 06/02/2014    Resolved Hospital Problems   Diagnosis Date Noted Date Resolved   Partial small bowel obstruction ([place]) 06/22/2021 03/17/2022    Priority: 1.   Acute cystitis 03/15/2022 03/17/2022     Discharge Instructions     Increase activity slowly   Complete by: As directed       Allergies as of 03/17/2022       Reactions   Buprenorphine Hcl Nausea And Vomiting   Dilaudid [hydromorphone Hcl] Anxiety, Other (See Comments)   Hypotension    Morphine And Related Nausea And Vomiting        Medication List     TAKE these medications    diphenhydrAMINE 25 MG tablet  Commonly known as: BENADRYL Take 25 mg by mouth at bedtime as needed for sleep.   lisinopril 10 MG tablet Commonly known as: ZESTRIL Take 10 mg by mouth daily.   omeprazole 20 MG capsule Commonly known as: PRILOSEC Take 20 mg by mouth daily.   Probiotic-10 Chew Chew 1 tablet by  mouth daily.   rosuvastatin 5 MG tablet Commonly known as: CRESTOR Take 5 mg by mouth daily.        Follow-up Information     Ronny Bacon, MD. Schedule an appointment as soon as possible for a visit in 3 week(s).   Specialty: General Surgery Why: hospital follow up; SBO; patient would like to dsicuss electvie LOA Contact information: [address] [place] [address] [phone]                Allergies  Allergen Reactions   Buprenorphine Hcl Nausea And Vomiting   Dilaudid [Hydromorphone Hcl] Anxiety and Other (See Comments)    Hypotension    Morphine And Related Nausea And Vomiting    Consultations: General surgery  Procedures:   Discharge Exam: BP 118/75 (BP Location: Left Arm)   Pulse 68   Temp 98 F (36.7 C)   Resp 18   Ht '5\' 2"'$  (1.575 m)   Wt 73.9 kg   SpO2 96%   BMI 29.80 kg/m  Physical Exam Constitutional:      General: She is not in acute distress.    Appearance: She is well-developed.  HENT:     Head: Normocephalic and atraumatic.     Mouth/Throat:     Mouth: Mucous membranes are moist.  Eyes:     Extraocular Movements: Extraocular movements intact.  Cardiovascular:     Rate and Rhythm: Normal rate and regular rhythm.  Pulmonary:     Effort: Pulmonary effort is normal.     Breath sounds: Normal breath sounds.  Abdominal:     General: Bowel sounds are normal. There is no distension.     Palpations: Abdomen is soft.     Tenderness: There is no abdominal tenderness.  Musculoskeletal:        General: Normal range of motion.     Cervical back: Normal range of motion and neck supple.  Skin:    General: Skin is warm and dry.  Neurological:     General: No focal deficit present.     Mental Status: She is alert and oriented to person, place, and time.  Psychiatric:        Mood and Affect: Mood normal.        Behavior: Behavior normal.      The results of significant diagnostics from this hospitalization  (including imaging, microbiology, ancillary and laboratory) are listed below for reference.   Microbiology: Recent Results (from the past 240 hour(s))  Urine Culture     Status: Abnormal   Collection Time: 03/15/22  6:13 PM   Specimen: Urine, Random  Result Value Ref Range Status   Specimen Description   Final    URINE, RANDOM Performed at [facility], [address], [place], [place] [address]    Special Requests   Final    NONE Performed at [facility], [address] [place], [place] [address]    Culture (A)  Final    30,000 COLONIES/mL GROUP B STREP(S.AGALACTIAE)ISOLATED TESTING AGAINST S. AGALACTIAE NOT ROUTINELY PERFORMED DUE TO PREDICTABILITY OF AMP/PEN/VAN SUSCEPTIBILITY. Performed at [facility], [place] [address], [place], [place] [address]  Report Status 03/17/2022 FINAL  Final     Labs: BNP (last 3 results) No results for input(s): "BNP" in the last 8760 hours. Basic Metabolic Panel: Recent Labs  Lab 03/15/22 1801 03/16/22 0542 03/17/22 0617  NA 136 137 141  K 3.7 3.7 3.9  CL 106 109 112*  CO2 '23 23 24  '$ GLUCOSE 123* 126* 90  BUN '10 10 10  '$ CREATININE 0.74 0.65 0.70  CALCIUM 9.1 8.9 8.6*  MG  --  1.9 2.0  PHOS  --  4.2  --    Liver Function Tests: Recent Labs  Lab 03/15/22 1801 03/16/22 0542  AST 17 16  ALT 14 15  ALKPHOS 53 51  BILITOT 1.0 0.7  PROT 7.1 6.5  ALBUMIN 4.0 3.5   Recent Labs  Lab 03/15/22 1801  LIPASE 29   No results for input(s): "AMMONIA" in the last 168 hours. CBC: Recent Labs  Lab 03/15/22 1801 03/16/22 0542 03/17/22 0617  WBC 6.4 4.8 3.4*  NEUTROABS  --   --  1.1*  HGB 14.0 13.8 12.7  HCT 40.7 39.6 36.9  MCV 86.0 86.3 86.2  PLT 271 233 215   Cardiac Enzymes: No results for input(s): "CKTOTAL", "CKMB", "CKMBINDEX", "TROPONINI" in the last 168 hours. BNP: Invalid input(s): "POCBNP" CBG: No results for input(s): "GLUCAP" in the last 168 hours. D-Dimer No results for input(s):  "DDIMER" in the last 72 hours. Hgb A1c No results for input(s): "HGBA1C" in the last 72 hours. Lipid Profile No results for input(s): "CHOL", "HDL", "LDLCALC", "TRIG", "CHOLHDL", "LDLDIRECT" in the last 72 hours. Thyroid function studies No results for input(s): "TSH", "T4TOTAL", "T3FREE", "THYROIDAB" in the last 72 hours.  Invalid input(s): "FREET3" Anemia work up No results for input(s): "VITAMINB12", "FOLATE", "FERRITIN", "TIBC", "IRON", "RETICCTPCT" in the last 72 hours. Urinalysis    Component Value Date/Time   COLORURINE YELLOW (A) 03/15/2022 1813   APPEARANCEUR CLEAR (A) 03/15/2022 1813   APPEARANCEUR Clear 03/20/2019 1126   LABSPEC 1.008 03/15/2022 1813   LABSPEC 1.006 08/23/2012 1638   PHURINE 5.0 03/15/2022 1813   GLUCOSEU NEGATIVE 03/15/2022 1813   GLUCOSEU Negative 08/23/2012 1638   HGBUR SMALL (A) 03/15/2022 1813   BILIRUBINUR NEGATIVE 03/15/2022 1813   BILIRUBINUR Negative 03/20/2019 1126   BILIRUBINUR Negative 08/23/2012 [place] 03/15/2022 1813   PROTEINUR NEGATIVE 03/15/2022 1813   NITRITE NEGATIVE 03/15/2022 1813   LEUKOCYTESUR MODERATE (A) 03/15/2022 1813   LEUKOCYTESUR 3+ 08/23/2012 1638   Sepsis Labs Recent Labs  Lab 03/15/22 1801 03/16/22 0542 03/17/22 0617  WBC 6.4 4.8 3.4*   Microbiology Recent Results (from the past 240 hour(s))  Urine Culture     Status: Abnormal   Collection Time: 03/15/22  6:13 PM   Specimen: Urine, Random  Result Value Ref Range Status   Specimen Description   Final    URINE, RANDOM Performed at [facility], [address] [place], [place] [address]    Special Requests   Final    NONE Performed at [facility], [address], [place], [place] [address]    Culture (A)  Final    30,000 COLONIES/mL GROUP B STREP(S.AGALACTIAE)ISOLATED TESTING AGAINST S. AGALACTIAE NOT ROUTINELY PERFORMED DUE TO PREDICTABILITY OF AMP/PEN/VAN SUSCEPTIBILITY. Performed at [facility],  [place] [address], [place], [place] [address]    Report Status 03/17/2022 FINAL  Final    Procedures/Studies: DG Abd 2 Views  Result Date: 03/17/2022 CLINICAL DATA:  Small bowel obstruction. EXAM: ABDOMEN -  2 VIEW COMPARISON:  CT of the abdomen and pelvis FINDINGS: Gaseous distension of large and small bowel has improved, now near normal. Gas can be seen to the distal colon. Air-fluid levels are present, compatible with mild ileus. IMPRESSION: Improving gaseous distension of large and small bowel, now near normal. No significant residual obstruction. Mild ileus remains. Electronically Signed   By: San Morelle M.D.   On: 03/17/2022 06:52   CT ABDOMEN PELVIS W CONTRAST  Result Date: 03/15/2022 CLINICAL DATA:  No bowel movements mid epigastric pain EXAM: CT ABDOMEN AND PELVIS WITH CONTRAST TECHNIQUE: Multidetector CT imaging of the abdomen and pelvis was performed using the standard protocol following bolus administration of intravenous contrast. RADIATION DOSE REDUCTION: This exam was performed according to the departmental dose-optimization program which includes automated exposure control, adjustment of the mA and/or kV according to patient size and/or use of iterative reconstruction technique. CONTRAST:  134m OMNIPAQUE IOHEXOL 300 MG/ML  SOLN COMPARISON:  CT 06/22/2021, 06/16/2021 FINDINGS: Lower chest: Lung bases demonstrate no acute airspace disease. Hepatobiliary: Multiple hepatic cysts. Liver granuloma. Status post cholecystectomy. No biliary dilatation Pancreas: Unremarkable. No pancreatic ductal dilatation or surrounding inflammatory changes. Spleen: Normal in size without focal abnormality. Adrenals/Urinary Tract: Adrenal glands are normal. Kidneys show no hydronephrosis. Cyst lower pole left kidney, no imaging follow-up is recommended. The bladder is slightly thick walled anteriorly. Stomach/Bowel: The stomach is nondistended. Proximal and mid small bowel is fluid-filled and mildly distended  up to 3.9 cm. The distal small bowel is fluid-filled and less distended but there is no abrupt transition point identified. There is no acute bowel wall thickening. Vascular/Lymphatic: Mild aortic atherosclerosis. No aneurysm. No suspicious lymph nodes Reproductive: Status post hysterectomy. No adnexal masses. Other: Negative for pelvic effusion or free air. Mild hazy appearance of the central mesentery is slightly increased, negative for mass or adenopathy. Musculoskeletal: No acute osseous abnormality. Vertebral hemangiomas T10 and L1. IMPRESSION: 1. Fluid-filled mildly distended proximal and mid small bowel with fluid-filled less distended distal small bowel but no abrupt transition point. Findings could be secondary to ileus or partial small bowel obstruction. Similar pattern as on previous exams. 2. 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 3. Slightly thick-walled appearance of the urinary bladder, correlate for cystitis. 4. Aortic atherosclerosis. Aortic Atherosclerosis (ICD10-I70.0). Electronically Signed   By: KDonavan FoilM.D.   On: 03/15/2022 22:29     Time coordinating discharge: Over 30 minutes    DDwyane Dee MD  [facility] 03/17/2022, 3:36 PM

## 2022-03-17 NOTE — TOC Initial Note (Signed)
Transition of Care [facility]) - Initial/Assessment Note    Patient Details  Name: [name] MRN: [id number] Date of Birth: [birth year]  Transition of Care [facility]) CM/SW Contact:    Laurena Slimmer, RN Phone Number: 03/17/2022, 11:05 AM  Clinical Narrative:                  Transition of Care (TOC) Screening Note   Patient Details  Name: [name] Date of Birth: Mar 02, 1941   Transition of Care [facility]) CM/SW Contact:    Laurena Slimmer, RN Phone Number: 03/17/2022, 11:05 AM    Transition of Care Department (TOC) has reviewed patient and no TOC needs have been identified at this time. We will continue to monitor patient advancement through interdisciplinary progression rounds. If new patient transition needs arise, please place a TOC consult.          Patient Goals and CMS Choice        Expected Discharge Plan and Services           Expected Discharge Date: 03/17/22                                    Prior Living Arrangements/Services                       Activities of Daily Living Home Assistive Devices/Equipment: None ADL Screening (condition at time of admission) Patient's cognitive ability adequate to safely complete daily activities?: Yes Is the patient deaf or have difficulty hearing?: No Does the patient have difficulty seeing, even when wearing glasses/contacts?: No Does the patient have difficulty concentrating, remembering, or making decisions?: No Patient able to express need for assistance with ADLs?: Yes Does the patient have difficulty dressing or bathing?: No Independently performs ADLs?: Yes (appropriate for developmental age) Does the patient have difficulty walking or climbing stairs?: No Weakness of Legs: None Weakness of Arms/Hands: None  Permission Sought/Granted                  Emotional Assessment              Admission diagnosis:  Partial small bowel obstruction ([place]) [K56.600] Acute cystitis without  hematuria [N30.00] Nausea vomiting and diarrhea [R11.2, R19.7] Patient Active Problem List   Diagnosis Date Noted   Nausea, vomiting and diarrhea 03/15/2022   Acute cystitis 03/15/2022   Nausea vomiting and diarrhea 03/15/2022   History of colonic polyps    Polyp of descending colon    Partial small bowel obstruction ([place]) 06/22/2021   Essential hypertension 06/16/2021   Pyuria 06/16/2021   Partial intestinal obstruction ([place])    Small bowel obstruction ([place]) 05/31/2020   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[place]) 17/61/6073   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   Ingrown toenail 08/03/2015   S/P nail surgery 08/03/2015   Mammogram abnormal 12/29/2014   Screening mammogram for high-risk patient 12/29/2014   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 [facility]) 09/13/2014   Allergic rhinitis 06/02/2014   Barrett esophagus 06/02/2014   Benign essential tremor 06/02/2014   Bronchitis, chronic ([place]) 06/02/2014   Arthritis, degenerative 06/02/2014   Lymphoma, non-Hodgkin's ([place]) 06/02/2014   RAD (reactive airway disease) 06/02/2014   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Phone: [phone]: [phone]    Social Determinants of Health (SDOH) Interventions    Readmission Risk Interventions     No data to display

## 2022-03-17 NOTE — Progress Notes (Signed)
Pt discharging home with family. PIV removed. Discharge instructions reviewed with patient, verbalized understanding.

## 2022-03-17 NOTE — Plan of Care (Signed)
  Problem: Education: Goal: Knowledge of General Education information will improve Description: Including pain rating scale, medication(s)/side effects and non-pharmacologic comfort measures 03/17/2022 0748 by Mancel Bale, RN Outcome: Progressing 03/16/2022 1824 by Mancel Bale, RN Outcome: Progressing   Problem: Health Behavior/Discharge Planning: Goal: Ability to manage health-related needs will improve 03/17/2022 0748 by Mancel Bale, RN Outcome: Progressing 03/16/2022 1824 by Mancel Bale, RN Outcome: Progressing   Problem: Clinical Measurements: Goal: Ability to maintain clinical measurements within normal limits will improve 03/17/2022 0748 by Mancel Bale, RN Outcome: Progressing 03/16/2022 1824 by Mancel Bale, RN Outcome: Progressing Goal: Will remain free from infection 03/17/2022 0748 by Mancel Bale, RN Outcome: Progressing 03/16/2022 1824 by Mancel Bale, RN Outcome: Progressing Goal: Diagnostic test results will improve 03/17/2022 0748 by Mancel Bale, RN Outcome: Progressing 03/16/2022 1824 by Mancel Bale, RN Outcome: Progressing Goal: Respiratory complications will improve 03/17/2022 0748 by Mancel Bale, RN Outcome: Progressing 03/16/2022 1824 by Mancel Bale, RN Outcome: Progressing Goal: Cardiovascular complication will be avoided 03/17/2022 0748 by Mancel Bale, RN Outcome: Progressing 03/16/2022 1824 by Mancel Bale, RN Outcome: Progressing   Problem: Activity: Goal: Risk for activity intolerance will decrease 03/17/2022 0748 by Mancel Bale, RN Outcome: Progressing 03/16/2022 1824 by Mancel Bale, RN Outcome: Progressing   Problem: Nutrition: Goal: Adequate nutrition will be maintained 03/17/2022 0748 by Mancel Bale, RN Outcome: Progressing 03/16/2022 1824 by Mancel Bale, RN Outcome: Progressing   Problem: Coping: Goal: Level of anxiety will decrease 03/17/2022 0748 by  Mancel Bale, RN Outcome: Progressing 03/16/2022 1824 by Mancel Bale, RN Outcome: Progressing   Problem: Elimination: Goal: Will not experience complications related to bowel motility 03/17/2022 0748 by Mancel Bale, RN Outcome: Progressing 03/16/2022 1824 by Mancel Bale, RN Outcome: Progressing Goal: Will not experience complications related to urinary retention 03/17/2022 0748 by Mancel Bale, RN Outcome: Progressing 03/16/2022 1824 by Mancel Bale, RN Outcome: Progressing   Problem: Pain Managment: Goal: General experience of comfort will improve 03/17/2022 0748 by Mancel Bale, RN Outcome: Progressing 03/16/2022 1824 by Mancel Bale, RN Outcome: Progressing   Problem: Safety: Goal: Ability to remain free from injury will improve 03/17/2022 0748 by Mancel Bale, RN Outcome: Progressing 03/16/2022 1824 by Mancel Bale, RN Outcome: Progressing   Problem: Skin Integrity: Goal: Risk for impaired skin integrity will decrease 03/17/2022 0748 by Mancel Bale, RN Outcome: Progressing 03/16/2022 1824 by Mancel Bale, RN Outcome: Progressing

## 2022-03-17 NOTE — Progress Notes (Signed)
[facility] SURGICAL PROGRESS NOTE (cpt [phone])  Hospital Day(s): 1.   Interval History: Patient seen and examined, no acute events or new complaints overnight. Patient reports she is feeling much better. She denies abdominal pain, nausea, emesis. Labs continue to be reassuring. KUB shows resolution in potential obstructive process. She is passing flatus and had two small BMs  Review of Systems:  Constitutional: denies fever, chills  HEENT: denies cough or congestion  Respiratory: denies any shortness of breath  Cardiovascular: denies chest pain or palpitations  Gastrointestinal: denies abdominal pain, N/V Genitourinary: denies burning with urination or urinary frequency Musculoskeletal: denies pain, decreased motor or sensation  Vital signs in last 24 hours: [min-max] current  Temp:  [97.6 F (36.4 C)-98.6 F (37 C)] 97.9 F (36.6 C) (11/10 0410) Pulse Rate:  [63-84] 63 (11/10 0410) Resp:  [16-20] 18 (11/10 0410) BP: (107-134)/(56-99) 107/56 (11/10 0410) SpO2:  [95 %-97 %] 95 % (11/10 0410) Weight:  [73.9 kg] 73.9 kg (11/10 0418)     Height: '5\' 2"'$  (157.5 cm) Weight: 73.9 kg BMI (Calculated): 29.79   Intake/Output last 2 shifts:  11/09 0701 - 11/10 0700 In: 1008.1 [I.V.:908; IV Piggyback:100.1] Out: -    Physical Exam:  Constitutional: alert, cooperative and no distress  HENT: normocephalic without obvious abnormality  Eyes: PERRL, EOM's grossly intact and symmetric  Respiratory: breathing non-labored at rest  Cardiovascular: regular rate and sinus rhythm  Gastrointestinal: soft, non-tender, and non-distended Musculoskeletal: no edema or wounds, motor and sensation grossly intact, NT    Labs:     Latest Ref Rng & Units 03/17/2022    6:17 AM 03/16/2022    5:42 AM 03/15/2022    6:01 PM  CBC  WBC 4.0 - 10.5 K/uL 3.4  4.8  6.4   Hemoglobin 12.0 - 15.0 g/dL 12.7  13.8  14.0   Hematocrit 36.0 - 46.0 % 36.9  39.6  40.7   Platelets 150 - 400 K/uL 215  233   271       Latest Ref Rng & Units 03/17/2022    6:17 AM 03/16/2022    5:42 AM 03/15/2022    6:01 PM  CMP  Glucose 70 - 99 mg/dL 90  126  123   BUN 8 - 23 mg/dL '10  10  10   '$ Creatinine 0.44 - 1.00 mg/dL 0.70  0.65  0.74   Sodium 135 - 145 mmol/L 141  137  136   Potassium 3.5 - 5.1 mmol/L 3.9  3.7  3.7   Chloride 98 - 111 mmol/L 112  109  106   CO2 22 - 32 mmol/L '24  23  23   '$ Calcium 8.9 - 10.3 mg/dL 8.6  8.9  9.1   Total Protein 6.5 - 8.1 g/dL  6.5  7.1   Total Bilirubin 0.3 - 1.2 mg/dL  0.7  1.0   Alkaline Phos 38 - 126 U/L  51  53   AST 15 - 41 U/L  16  17   ALT 0 - 44 U/L  15  14      Imaging studies:   KUB (03/17/2022) personally reviewed which shows improving/resolving bowel dilation, gas in colon, and radiologist report reviewed below:  IMPRESSION: Improving gaseous distension of large and small bowel, now near normal. No significant residual obstruction. Mild ileus remains.   Assessment/Plan: (ICD-10's: K81.609) 81 y.o. female with clinically and radiographically resolved partial small bowel obstruction   - Okay to advance diet    -  No role for NGT decompression - No role for surgical interventions - Monitor abdominal examination   - Pain control prn; antiemetics prn   - Mobilize as tolerated - Further management per primary service  - Discharge Planning: Nothing further from surgical perspective. If she tolerates advancement of diet, she can potentially DC home this afternoon. I will arrange outpatient follow up as she would like to discuss elective LOA given her recurrences  All of the above findings and recommendations were discussed with the patient, and the medical team, and all of patient's questions were answered to her expressed satisfaction.  -- Edison Simon, PA-C [facility] 03/17/2022, 7:21 AM M-F: 7am - 4pm

## 2022-03-17 NOTE — Plan of Care (Signed)
  Problem: Education: Goal: Knowledge of General Education information will improve Description: Including pain rating scale, medication(s)/side effects and non-pharmacologic comfort measures 03/17/2022 1335 by Mancel Bale, RN Outcome: Adequate for Discharge 03/17/2022 0748 by Mancel Bale, RN Outcome: Progressing   Problem: Health Behavior/Discharge Planning: Goal: Ability to manage health-related needs will improve 03/17/2022 1335 by Mancel Bale, RN Outcome: Adequate for Discharge 03/17/2022 0748 by Mancel Bale, RN Outcome: Progressing   Problem: Clinical Measurements: Goal: Ability to maintain clinical measurements within normal limits will improve 03/17/2022 1335 by Mancel Bale, RN Outcome: Adequate for Discharge 03/17/2022 0748 by Mancel Bale, RN Outcome: Progressing Goal: Will remain free from infection 03/17/2022 1335 by Mancel Bale, RN Outcome: Adequate for Discharge 03/17/2022 0748 by Mancel Bale, RN Outcome: Progressing Goal: Diagnostic test results will improve 03/17/2022 1335 by Mancel Bale, RN Outcome: Adequate for Discharge 03/17/2022 0748 by Mancel Bale, RN Outcome: Progressing Goal: Respiratory complications will improve 03/17/2022 1335 by Mancel Bale, RN Outcome: Adequate for Discharge 03/17/2022 0748 by Mancel Bale, RN Outcome: Progressing Goal: Cardiovascular complication will be avoided 03/17/2022 1335 by Mancel Bale, RN Outcome: Adequate for Discharge 03/17/2022 0748 by Mancel Bale, RN Outcome: Progressing   Problem: Activity: Goal: Risk for activity intolerance will decrease 03/17/2022 1335 by Mancel Bale, RN Outcome: Adequate for Discharge 03/17/2022 0748 by Mancel Bale, RN Outcome: Progressing   Problem: Nutrition: Goal: Adequate nutrition will be maintained 03/17/2022 1335 by Mancel Bale, RN Outcome: Adequate for Discharge 03/17/2022 0748 by Mancel Bale, RN Outcome: Progressing   Problem: Coping: Goal: Level of anxiety will decrease 03/17/2022 1335 by Mancel Bale, RN Outcome: Adequate for Discharge 03/17/2022 0748 by Mancel Bale, RN Outcome: Progressing   Problem: Elimination: Goal: Will not experience complications related to bowel motility 03/17/2022 1335 by Mancel Bale, RN Outcome: Adequate for Discharge 03/17/2022 0748 by Mancel Bale, RN Outcome: Progressing Goal: Will not experience complications related to urinary retention 03/17/2022 1335 by Mancel Bale, RN Outcome: Adequate for Discharge 03/17/2022 0748 by Mancel Bale, RN Outcome: Progressing   Problem: Pain Managment: Goal: General experience of comfort will improve 03/17/2022 1335 by Mancel Bale, RN Outcome: Adequate for Discharge 03/17/2022 0748 by Mancel Bale, RN Outcome: Progressing   Problem: Safety: Goal: Ability to remain free from injury will improve 03/17/2022 1335 by Mancel Bale, RN Outcome: Adequate for Discharge 03/17/2022 0748 by Mancel Bale, RN Outcome: Progressing   Problem: Skin Integrity: Goal: Risk for impaired skin integrity will decrease 03/17/2022 1335 by Mancel Bale, RN Outcome: Adequate for Discharge 03/17/2022 0748 by Mancel Bale, RN Outcome: Progressing

## 2022-03-23 ENCOUNTER — Ambulatory Visit (INDEPENDENT_AMBULATORY_CARE_PROVIDER_SITE_OTHER): Payer: Medicare Other | Admitting: Surgery

## 2022-03-23 ENCOUNTER — Encounter: Payer: Self-pay | Admitting: Surgery

## 2022-03-23 VITALS — BP 136/84 | HR 80 | Temp 97.8°F | Ht 62.0 in | Wt 169.2 lb

## 2022-03-23 DIAGNOSIS — K56600 Partial intestinal obstruction, unspecified as to cause: Secondary | ICD-10-CM | POA: Diagnosis not present

## 2022-03-23 NOTE — Progress Notes (Signed)
HISTORY OF PRESENT ILLNESS (HPI):  81 y.o. female presented to [facility] ED previously for evaluation of symptoms she is familiar with, having to prior episodes last February. Patient reports voiding and having multiple small bowel movements.  She denies any vomiting and has had some epigastric crampy abdominal pain.  Prior surgical history includes mesenteric lymph node biopsy for which she was diagnosed with non-Hodgkin's lymphoma, total hysterectomy, appendectomy, cholecystectomy, and a ventral hernia repair. She sees Dr. Allen Norris from gastroenterology. She was admitted and observed, and her symptoms resolved fairly quickly without requiring nasogastric decompression or serial follow-up radiological exams.   She presents today having no complaint of abdominal pain, no nausea or vomiting.  Looking for dietary instructions as to might prevent this or how to avoid this in the future. We reviewed various options, but it is a challenging fix to presume to make.   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 ([place]) 09/13/2014   Non Hodgkin's lymphoma ([place])     Non Hodgkin's lymphoma ([place])     RAD (reactive airway disease)     Skin cancer        PAST SURGICAL HISTORY ([place]):       Past Surgical History:  Procedure Laterality Date   ABDOMINAL HYSTERECTOMY       APPENDECTOMY       BASAL CELL CARCINOMA EXCISION        on both sides of nose   BREAST BIOPSY Left 12/2013    hyperplasia    CATARACT EXTRACTION W/PHACO Right 01/10/2022    Procedure: CATARACT EXTRACTION PHACO AND INTRAOCULAR LENS PLACEMENT ([place]) RIGHT;  Surgeon: Birder Robson, MD;  Location: [place];  Service: Ophthalmology;  Laterality: Right;  7.85 0:38.1   CATARACT EXTRACTION W/PHACO Left 01/31/2022    Procedure: CATARACT EXTRACTION PHACO AND INTRAOCULAR LENS PLACEMENT ([facility]) LEFT 8.45 00:46.1;  Surgeon: Birder Robson, MD;  Location: [place];  Service: Ophthalmology;  Laterality: Left;   CHOLECYSTECTOMY       COLONOSCOPY N/A 09/16/2021    Procedure: COLONOSCOPY;  Surgeon: Lucilla Lame, MD;  Location: [place];  Service: Endoscopy;  Laterality: N/A;   COLONOSCOPY WITH PROPOFOL N/A 06/05/2017    Procedure: COLONOSCOPY WITH PROPOFOL;  Surgeon: Lollie Sails, MD;  Location: [facility] ENDOSCOPY;  Service: Endoscopy;  Laterality: N/A;   COLONOSCOPY WITH PROPOFOL N/A 05/14/2020    Procedure: COLONOSCOPY WITH PROPOFOL;  Surgeon: Lesly Rubenstein, MD;  Location: [facility] ENDOSCOPY;  Service: Endoscopy;  Laterality: N/A;   ESOPHAGOGASTRODUODENOSCOPY (EGD) WITH PROPOFOL N/A 03/30/2015    Procedure: ESOPHAGOGASTRODUODENOSCOPY (EGD) WITH PROPOFOL;  Surgeon: Lollie Sails, MD;  Location: [facility] ENDOSCOPY;  Service: Endoscopy;  Laterality: N/A;   ESOPHAGOGASTRODUODENOSCOPY (EGD) WITH PROPOFOL N/A 06/05/2017    Procedure:  ESOPHAGOGASTRODUODENOSCOPY (EGD) WITH PROPOFOL;  Surgeon: Lollie Sails, MD;  Location: [facility] ENDOSCOPY;  Service: Endoscopy;  Laterality: N/A;   ESOPHAGOGASTRODUODENOSCOPY (EGD) WITH PROPOFOL N/A 05/14/2020    Procedure: ESOPHAGOGASTRODUODENOSCOPY (EGD) WITH PROPOFOL;  Surgeon: Lesly Rubenstein, MD;  Location: [facility] ENDOSCOPY;  Service: Endoscopy;  Laterality: N/A;   FACIAL COSMETIC SURGERY       history of multiple colonic polyps       lymph node removal from left groin       OOPHORECTOMY       VENTRAL HERNIA REPAIR N/A 10/02/2014    Procedure: HERNIA REPAIR VENTRAL  ADULT;  Surgeon: Leonie Green, MD;  Location: [facility] ORS;  Service: General;  Laterality: N/A;  with mesh      MEDICATIONS:         Prior to Admission medications   Medication Sig Start Date End Date Taking? Authorizing Provider  diphenhydrAMINE (BENADRYL) 25 MG tablet Take 25 mg by mouth every 6 (six) hours as needed.       [provider]  lisinopril (PRINIVIL,ZESTRIL) 10 MG tablet Take 10 mg by mouth daily. 09/21/16     [provider]  loratadine (CLARITIN) 10 MG tablet Take 10 mg by mouth daily.       [provider]  omeprazole (PRILOSEC) 20 MG capsule Take 20 mg by mouth daily. 06/08/15     [provider]  Probiotic Product (PROBIOTIC-10) CHEW Chew by mouth.       [provider]  rosuvastatin (CRESTOR) 5 MG tablet Take 5 mg by mouth daily. 08/12/20 03/01/22   [provider]      ALLERGIES:       Allergies  Allergen Reactions   Buprenorphine Hcl Nausea And Vomiting   Dilaudid [Hydromorphone Hcl] Anxiety and Other (See Comments)      Hypotension    Morphine And Related Nausea And Vomiting      SOCIAL HISTORY:  Social History         Socioeconomic History   Marital status: Married      Spouse name: Not on file   Number of children: Not on file   Years of education: Not on file   Highest education level: Not on file  Occupational History   Not on file  Tobacco Use   Smoking status: Never   Smokeless tobacco: Never  Vaping Use   Vaping Use: Never used  Substance and Sexual Activity   Alcohol use: Not Currently      Alcohol/week: 1.0 standard drink of alcohol      Types: 1 Cans of beer per week      Comment: rare social occasions only   Drug use: Never   Sexual activity: Never  Other Topics Concern   Not on file  Social History Narrative   Not on file    Social Determinants of Health    Financial Resource Strain: Not on file  Food Insecurity: Not on file  Transportation Needs: Not on file  Physical  Activity: Not on file  Stress: Not on file  Social Connections: Not on file  Intimate Partner Violence: Not on file      FAMILY HISTORY:       Family History  Problem Relation Age of Onset   Alcohol abuse Father     Stroke Father     Diabetes Sister     Kidney cancer Sister     Melanoma Brother     Diabetes Brother  Heart attack Brother     Breast cancer Neg Hx     Bladder Cancer Neg Hx     Prostate cancer Neg Hx          REVIEW OF SYSTEMS:  Review of Systems  Constitutional:  Negative for chills and fever.  HENT: Negative.    Eyes: Negative.   Respiratory:  Negative for cough.   Cardiovascular:  Negative for chest pain.  Skin: Negative.   Neurological: Negative.   Psychiatric/Behavioral: Negative.        VITAL SIGNS:  Temp:  [97.7 F (36.5 C)-99.1 F (37.3 C)] 99.1 F (37.3 C) (11/08 2231) Pulse Rate:  [82-92] 82 (11/08 2300) Resp:  [16-18] 16 (11/08 2300) BP: (110-133)/(60-89) 110/75 (11/08 2300) SpO2:  [95 %-99 %] 99 % (11/08 2300) Weight:  [74.8 kg] 74.8 kg (11/08 1806)     Height: '5\' 2"'$  (157.5 cm) Weight: 74.8 kg BMI (Calculated): 30.17    INTAKE/OUTPUT:  11/08 0701 - 11/09 0700 In: 100 [IV Piggyback:100] Out: -    PHYSICAL EXAM:  Physical Exam Blood pressure 110/75, pulse 82, temperature 99.1 F (37.3 C), temperature source Oral, resp. rate 16, height '5\' 2"'$  (1.575 m), weight 74.8 kg, SpO2 99 %. Last Weight  Most recent update: 03/15/2022  6:07 PM      Weight  74.8 kg (165 lb)                     CONSTITUTIONAL: Well developed, and nourished, appropriately responsive and aware without distress.   EYES: Sclera non-icteric.   EARS, NOSE, MOUTH AND THROAT:  The oropharynx is clear. Oral mucosa is pink and moist.    Hearing is intact to voice.  NECK: Trachea is midline, and there is no jugular venous distension.  LYMPH NODES:  Lymph nodes in the neck were not appreciated. RESPIRATORY:  Lungs are clear, and breath sounds are equal bilaterally.  Normal respiratory effort without pathologic use of accessory muscles. CARDIOVASCULAR: Heart is regular in rate and rhythm. GI: The abdomen is soft, nontender, and nondistended.  MUSCULOSKELETAL:  Symmetrical muscle tone appreciated in all four extremities.    SKIN: Skin turgor is normal. No pathologic skin lesions appreciated.  NEUROLOGIC:  Motor and sensation appear grossly normal.  Cranial nerves are grossly without defect. PSYCH:  Alert and oriented to person, place and time. Affect is appropriate for situation.   Data Reviewed I have personally reviewed what is currently available of the patient's imaging, recent labs and medical records.     Labs:      Latest Ref Rng & Units 03/15/2022    6:01 PM 03/01/2022    2:33 PM 08/26/2021    9:56 AM  CBC  WBC 4.0 - 10.5 K/uL 6.4  5.7  5.5   Hemoglobin 12.0 - 15.0 g/dL 14.0  14.0  13.4   Hematocrit 36.0 - 46.0 % 40.7  41.7  39.6   Platelets 150 - 400 K/uL 271  245  257         Latest Ref Rng & Units 03/15/2022    6:01 PM 03/01/2022    2:33 PM 08/26/2021    9:56 AM  CMP  Glucose 70 - 99 mg/dL 123  107  96   BUN 8 - 23 mg/dL '10  16  16   '$ Creatinine 0.44 - 1.00 mg/dL 0.74  0.92  0.85   Sodium 135 - 145 mmol/L 136  139  139   Potassium 3.5 - 5.1 mmol/L  3.7  3.9  3.7   Chloride 98 - 111 mmol/L 106  106  108   CO2 22 - 32 mmol/L '23  26  24   '$ Calcium 8.9 - 10.3 mg/dL 9.1  9.4  9.0   Total Protein 6.5 - 8.1 g/dL 7.1  7.6  7.2   Total Bilirubin 0.3 - 1.2 mg/dL 1.0  0.4  0.6   Alkaline Phos 38 - 126 U/L 53  58  56   AST 15 - 41 U/L '17  21  25   '$ ALT 0 - 44 U/L '14  20  19         '$ Imaging studies:    Last 24 hrs: CT ABDOMEN PELVIS W CONTRAST   Result Date: 03/15/2022 CLINICAL DATA:  No bowel movements mid epigastric pain EXAM: CT ABDOMEN AND PELVIS WITH CONTRAST TECHNIQUE: Multidetector CT imaging of the abdomen and pelvis was performed using the standard protocol following bolus administration of intravenous contrast. RADIATION DOSE  REDUCTION: This exam was performed according to the departmental dose-optimization program which includes automated exposure control, adjustment of the mA and/or kV according to patient size and/or use of iterative reconstruction technique. CONTRAST:  134m OMNIPAQUE IOHEXOL 300 MG/ML  SOLN COMPARISON:  CT 06/22/2021, 06/16/2021 FINDINGS: Lower chest: Lung bases demonstrate no acute airspace disease. Hepatobiliary: Multiple hepatic cysts. Liver granuloma. Status post cholecystectomy. No biliary dilatation Pancreas: Unremarkable. No pancreatic ductal dilatation or surrounding inflammatory changes. Spleen: Normal in size without focal abnormality. Adrenals/Urinary Tract: Adrenal glands are normal. Kidneys show no hydronephrosis. Cyst lower pole left kidney, no imaging follow-up is recommended. The bladder is slightly thick walled anteriorly. Stomach/Bowel: The stomach is nondistended. Proximal and mid small bowel is fluid-filled and mildly distended up to 3.9 cm. The distal small bowel is fluid-filled and less distended but there is no abrupt transition point identified. There is no acute bowel wall thickening. Vascular/Lymphatic: Mild aortic atherosclerosis. No aneurysm. No suspicious lymph nodes Reproductive: Status post hysterectomy. No adnexal masses. Other: Negative for pelvic effusion or free air. Mild hazy appearance of the central mesentery is slightly increased, negative for mass or adenopathy. Musculoskeletal: No acute osseous abnormality. Vertebral hemangiomas T10 and L1. IMPRESSION: 1. Fluid-filled mildly distended proximal and mid small bowel with fluid-filled less distended distal small bowel but no abrupt transition point. Findings could be secondary to ileus or partial small bowel obstruction. Similar pattern as on previous exams. 2. 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 3. Slightly thick-walled appearance of the urinary  bladder, correlate for cystitis. 4. Aortic atherosclerosis. Aortic Atherosclerosis (ICD10-I70.0). Electronically Signed   By: KDonavan FoilM.D.   On: 03/15/2022 22:29       Assessment/Plan:  81y.o. 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 ([facility] 02/24/2016   Ingrown toenail 08/03/2015   S/P nail  surgery 08/03/2015   Mammogram abnormal 12/29/2014   Screening mammogram for high-risk patient 12/29/2014   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 [facility]) 09/13/2014   Allergic rhinitis 06/02/2014   Barrett esophagus 06/02/2014   Benign essential tremor 06/02/2014   Bronchitis, chronic ([place]) 06/02/2014   Arthritis, degenerative 06/02/2014   Lymphoma, non-Hodgkin's ([place]) 06/02/2014   RAD (reactive airway disease) 06/02/2014   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   Chronic bronchitis ([place]) 06/02/2014   Non-Hodgkin's lymphoma ([place]) 06/02/2014   Gastro-esophageal reflux disease without esophagitis 06/02/2014   HLD (hyperlipidemia) 12/10/2013   Adenitis, salivary, 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  We discussed the problematic challenges with intermittent  partial small bowel obstructions.  I believe electively is somewhat challenging to proceed on with any type of a laparoscopic procedure expecting to make a significant difference that does not had increased risk to the process.  We remain readily available to help her should she have recurrence of her symptoms.

## 2022-03-23 NOTE — Patient Instructions (Addendum)
If you have any concerns or questions, please feel free to call our office. Follow up as needed.  

## 2022-09-01 ENCOUNTER — Encounter: Payer: Self-pay | Admitting: Internal Medicine

## 2022-09-01 ENCOUNTER — Inpatient Hospital Stay: Payer: Medicare Other | Attending: Internal Medicine

## 2022-09-01 ENCOUNTER — Inpatient Hospital Stay (HOSPITAL_BASED_OUTPATIENT_CLINIC_OR_DEPARTMENT_OTHER): Payer: Medicare Other | Admitting: Internal Medicine

## 2022-09-01 VITALS — BP 141/74 | HR 80 | Temp 97.7°F | Ht 62.0 in | Wt 170.4 lb

## 2022-09-01 DIAGNOSIS — Z823 Family history of stroke: Secondary | ICD-10-CM | POA: Diagnosis not present

## 2022-09-01 DIAGNOSIS — Z808 Family history of malignant neoplasm of other organs or systems: Secondary | ICD-10-CM | POA: Insufficient documentation

## 2022-09-01 DIAGNOSIS — Z85828 Personal history of other malignant neoplasm of skin: Secondary | ICD-10-CM | POA: Diagnosis not present

## 2022-09-01 DIAGNOSIS — Z90721 Acquired absence of ovaries, unilateral: Secondary | ICD-10-CM | POA: Diagnosis not present

## 2022-09-01 DIAGNOSIS — Z8051 Family history of malignant neoplasm of kidney: Secondary | ICD-10-CM | POA: Insufficient documentation

## 2022-09-01 DIAGNOSIS — M2550 Pain in unspecified joint: Secondary | ICD-10-CM | POA: Diagnosis not present

## 2022-09-01 DIAGNOSIS — Z79899 Other long term (current) drug therapy: Secondary | ICD-10-CM | POA: Insufficient documentation

## 2022-09-01 DIAGNOSIS — C8203 Follicular lymphoma grade I, intra-abdominal lymph nodes: Secondary | ICD-10-CM

## 2022-09-01 DIAGNOSIS — Z8719 Personal history of other diseases of the digestive system: Secondary | ICD-10-CM | POA: Insufficient documentation

## 2022-09-01 DIAGNOSIS — M549 Dorsalgia, unspecified: Secondary | ICD-10-CM | POA: Diagnosis not present

## 2022-09-01 DIAGNOSIS — Z90710 Acquired absence of both cervix and uterus: Secondary | ICD-10-CM | POA: Diagnosis not present

## 2022-09-01 DIAGNOSIS — Z8249 Family history of ischemic heart disease and other diseases of the circulatory system: Secondary | ICD-10-CM | POA: Diagnosis not present

## 2022-09-01 DIAGNOSIS — K56600 Partial intestinal obstruction, unspecified as to cause: Secondary | ICD-10-CM | POA: Insufficient documentation

## 2022-09-01 DIAGNOSIS — Z885 Allergy status to narcotic agent status: Secondary | ICD-10-CM | POA: Insufficient documentation

## 2022-09-01 DIAGNOSIS — Z811 Family history of alcohol abuse and dependence: Secondary | ICD-10-CM | POA: Insufficient documentation

## 2022-09-01 DIAGNOSIS — Z9049 Acquired absence of other specified parts of digestive tract: Secondary | ICD-10-CM | POA: Diagnosis not present

## 2022-09-01 DIAGNOSIS — Z833 Family history of diabetes mellitus: Secondary | ICD-10-CM | POA: Diagnosis not present

## 2022-09-01 LAB — COMPREHENSIVE METABOLIC PANEL
ALT: 22 U/L (ref 0–44)
AST: 23 U/L (ref 15–41)
Albumin: 4.2 g/dL (ref 3.5–5.0)
Alkaline Phosphatase: 56 U/L (ref 38–126)
Anion gap: 7 (ref 5–15)
BUN: 18 mg/dL (ref 8–23)
CO2: 25 mmol/L (ref 22–32)
Calcium: 9.1 mg/dL (ref 8.9–10.3)
Chloride: 103 mmol/L (ref 98–111)
Creatinine, Ser: 0.78 mg/dL (ref 0.44–1.00)
GFR, Estimated: >60 mL/min (ref >60)
Glucose, Bld: 147 mg/dL — ABNORMAL HIGH (ref 70–99)
Potassium: 3.7 mmol/L (ref 3.5–5.1)
Sodium: 135 mmol/L (ref 135–145)
Total Bilirubin: 0.4 mg/dL (ref 0.3–1.2)
Total Protein: 7.1 g/dL (ref 6.5–8.1)

## 2022-09-01 LAB — CBC WITH DIFFERENTIAL/PLATELET
Abs Immature Granulocytes: 0.02 10*3/uL (ref 0.00–0.07)
Basophils Absolute: 0 10*3/uL (ref 0.0–0.1)
Basophils Relative: 1 %
Eosinophils Absolute: 0.1 10*3/uL (ref 0.0–0.5)
Eosinophils Relative: 1 %
HCT: 39.1 % (ref 36.0–46.0)
Hemoglobin: 13.4 g/dL (ref 12.0–15.0)
Immature Granulocytes: 0 %
Lymphocytes Relative: 52 %
Lymphs Abs: 3.2 10*3/uL (ref 0.7–4.0)
MCH: 30.2 pg (ref 26.0–34.0)
MCHC: 34.3 g/dL (ref 30.0–36.0)
MCV: 88.1 fL (ref 80.0–100.0)
Monocytes Absolute: 0.5 10*3/uL (ref 0.1–1.0)
Monocytes Relative: 7 %
Neutro Abs: 2.4 10*3/uL (ref 1.7–7.7)
Neutrophils Relative %: 39 %
Platelets: 252 10*3/uL (ref 150–400)
RBC: 4.44 MIL/uL (ref 3.87–5.11)
RDW: 13 % (ref 11.5–15.5)
WBC: 6.2 10*3/uL (ref 4.0–10.5)
nRBC: 0 % (ref 0.0–0.2)

## 2022-09-01 LAB — LACTATE DEHYDROGENASE: LDH: 102 U/L (ref 98–192)

## 2022-09-01 NOTE — Assessment & Plan Note (Signed)
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  CT scan FEB 2023 [[facility]]-negative for any lymphadenopathy- stable- Labs today within normal limits.  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   # parking placard- arthritis- given.   # Disposition:  # follow-up 6 m-MD /lab-CBC CMP [facility]- Dr.B

## 2022-09-01 NOTE — Progress Notes (Signed)
No concerns today 

## 2022-09-01 NOTE — Progress Notes (Signed)
[facility] OFFICE PROGRESS NOTE  Patient Care Team: Patrice Paradise, MD as PCP - General (Physician Assistant) Earna Coder, MD as Consulting Physician (Hematology and Oncology)   Cancer Staging  No matching staging information was found for the patient.    Oncology History Overview Note    1.FEB 2013-  MESENTERIC LYMPHADENOPATHY;] Pet positive lymphadenopathy.; . 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 3. Rituxan weekly x4 finished in March of 2013 4. PET scan in July of 2013 complete remission. 5. Maintenance Rituxan started in July of 2013;  x 2 years [finished 2015]  #April 2019-melanoma/left side of the nose [2020]/ Left arm [2021][Dr. Kowalski/Dr.Dasher]   # LFTs- slightly up/ ? faty liver.  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  Ambulating independently.  82 year old patient above history for follow-up of lymphoma status post Rituxan maintenance was completed in 2015 is here for follow-up..  In the interim patient denies any new lymph nodes.  Denies any night sweats.  Denies any fevers or chills.Marland Kitchen  REVIEW OF SYSTEMS:  Review of Systems  Constitutional: Negative.  Negative for chills, fever, malaise/fatigue and weight loss.  HENT:  Negative for congestion, ear pain and tinnitus.   Eyes: Negative.  Negative for blurred vision and double vision.  Respiratory: Negative.  Negative for cough, sputum production and shortness of breath.   Cardiovascular: Negative.  Negative for chest pain, palpitations and leg swelling.  Gastrointestinal: Negative.  Negative for abdominal pain, constipation, diarrhea, nausea and vomiting.  Musculoskeletal:  Positive for back  pain and joint pain. Negative for falls.  Neurological: Negative.  Negative for weakness and headaches.  Endo/Heme/Allergies: Negative.  Does not bruise/bleed easily.  Psychiatric/Behavioral: Negative.  Negative for depression. The patient is not nervous/anxious and does not have insomnia.    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   Hyperlipidemia    Hypertension    Melanoma (HCC)    Migraine    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   Non Hodgkin's lymphoma (HCC)    Non Hodgkin's lymphoma (HCC)    RAD (reactive airway disease)    Skin cancer     PAST SURGICAL HISTORY :   Past Surgical History:  Procedure Laterality Date   ABDOMINAL HYSTERECTOMY     APPENDECTOMY     BASAL CELL CARCINOMA EXCISION     on both sides of nose   BREAST BIOPSY Left 12/2013   hyperplasia   CATARACT EXTRACTION W/PHACO Right 01/10/2022   Procedure: CATARACT EXTRACTION PHACO AND INTRAOCULAR LENS PLACEMENT (IOC) RIGHT;  Surgeon: Galen Manila, MD;  Location: [facility];  Service: Ophthalmology;  Laterality: Right;  7.85 0:38.1   CATARACT EXTRACTION W/PHACO Left 01/31/2022   Procedure: CATARACT  EXTRACTION PHACO AND INTRAOCULAR LENS PLACEMENT (IOC) LEFT 8.45 00:46.1;  Surgeon: Galen Manila, MD;  Location: [facility];  Service: Ophthalmology;  Laterality: Left;   CHOLECYSTECTOMY     COLONOSCOPY N/A 09/16/2021   Procedure: COLONOSCOPY;  Surgeon: Midge Minium, MD;  Location: [facility];  Service: Endoscopy;  Laterality: N/A;   COLONOSCOPY WITH PROPOFOL N/A 06/05/2017    Procedure: COLONOSCOPY WITH PROPOFOL;  Surgeon: Christena Deem, MD;  Location: [facility] ENDOSCOPY;  Service: Endoscopy;  Laterality: N/A;   COLONOSCOPY WITH PROPOFOL N/A 05/14/2020   Procedure: COLONOSCOPY WITH PROPOFOL;  Surgeon: Regis Bill, MD;  Location: [facility] ENDOSCOPY;  Service: Endoscopy;  Laterality: N/A;   ESOPHAGOGASTRODUODENOSCOPY (EGD) WITH PROPOFOL N/A 03/30/2015   Procedure: ESOPHAGOGASTRODUODENOSCOPY (EGD) WITH PROPOFOL;  Surgeon: Christena Deem, MD;  Location: [facility] ENDOSCOPY;  Service: Endoscopy;  Laterality: N/A;   ESOPHAGOGASTRODUODENOSCOPY (EGD) WITH PROPOFOL N/A 06/05/2017   Procedure: ESOPHAGOGASTRODUODENOSCOPY (EGD) WITH PROPOFOL;  Surgeon: Christena Deem, MD;  Location: [facility] ENDOSCOPY;  Service: Endoscopy;  Laterality: N/A;   ESOPHAGOGASTRODUODENOSCOPY (EGD) WITH PROPOFOL N/A 05/14/2020   Procedure: ESOPHAGOGASTRODUODENOSCOPY (EGD) WITH PROPOFOL;  Surgeon: Regis Bill, MD;  Location: [facility] ENDOSCOPY;  Service: Endoscopy;  Laterality: N/A;   FACIAL COSMETIC SURGERY     history of multiple colonic polyps     lymph node removal from left groin     OOPHORECTOMY     VENTRAL HERNIA REPAIR N/A 10/02/2014   Procedure: HERNIA REPAIR VENTRAL ADULT;  Surgeon: Nadeen Landau, MD;  Location: [facility] ORS;  Service: General;  Laterality: N/A;  with mesh    FAMILY HISTORY :   Family History  Problem Relation Age of Onset   Alcohol abuse Father    Stroke Father    Diabetes Sister    Kidney cancer Sister    Melanoma Brother    Diabetes Brother    Heart attack Brother    Breast cancer Neg Hx    Bladder Cancer Neg Hx    Prostate cancer Neg Hx     SOCIAL HISTORY:   Social History   Tobacco Use   Smoking status: Never   Smokeless tobacco: Never  Vaping Use   Vaping Use: Never used  Substance Use Topics   Alcohol use: Not Currently    Alcohol/week: 1.0 standard drink of alcohol    Types: 1 Cans of beer per week    Comment: rare social occasions only    Drug use: Never    ALLERGIES:  is allergic to buprenorphine hcl, dilaudid [hydromorphone hcl], and morphine and related.  MEDICATIONS:  Current Outpatient Medications  Medication Sig Dispense Refill   diphenhydrAMINE (BENADRYL) 25 MG tablet Take 25 mg by mouth at bedtime as needed for sleep.     lisinopril (PRINIVIL,ZESTRIL) 10 MG tablet Take 10 mg by mouth daily.     omeprazole (PRILOSEC) 20 MG capsule Take 20 mg by mouth daily.     Probiotic Product (PROBIOTIC-10) CHEW Chew 1 tablet by mouth daily.     rosuvastatin (CRESTOR) 5 MG tablet Take 5 mg by mouth daily.     No current facility-administered medications for this visit.    PHYSICAL EXAMINATION: ECOG PERFORMANCE STATUS: 0 - Asymptomatic  BP (!) 141/74 (BP Location: Right Arm, Patient Position: Sitting, Cuff Size: Normal)   Pulse 80   Temp 97.7 F (36.5 C) (Tympanic)   Ht 5\' 2"  (1.575 m)   Wt 170 lb 6.4 oz (77.3 kg)   SpO2 97%   BMI 31.17 kg/m  Filed Weights   09/01/22 1502  Weight: 170 lb 6.4 oz (77.3 kg)   Physical Exam Constitutional:      Comments: Obese.  Walk independently.  HENT:     Head: Normocephalic and atraumatic.     Mouth/Throat:     Pharynx: No oropharyngeal exudate.  Eyes:     Pupils: Pupils are equal, round, and reactive to light.  Cardiovascular:     Rate and Rhythm: Normal rate and regular rhythm.  Pulmonary:     Effort: Pulmonary effort is normal. No respiratory distress.     Breath sounds: Normal breath sounds. No wheezing.  Abdominal:     General: Bowel sounds are normal. There is no distension.     Palpations: Abdomen is soft. There is no mass.     Tenderness: There is no abdominal tenderness. There is no guarding or rebound.  Musculoskeletal:        General: No tenderness. Normal range of motion.     Cervical back: Normal range of motion and neck supple.  Skin:    General: Skin is warm.  Neurological:     Mental Status: She is alert and oriented to person, place, and time.   Psychiatric:        Mood and Affect: Affect normal.    LABORATORY DATA:  I have reviewed the data as listed    Component Value Date/Time   NA 135 09/01/2022 1503   NA 141 03/03/2014 1022   K 3.7 09/01/2022 1503   K 4.1 03/03/2014 1022   CL 103 09/01/2022 1503   CL 105 03/03/2014 1022   CO2 25 09/01/2022 1503   CO2 28 03/03/2014 1022   GLUCOSE 147 (H) 09/01/2022 1503   GLUCOSE 112 (H) 03/03/2014 1022   BUN 18 09/01/2022 1503   BUN 18 03/03/2014 1022   CREATININE 0.78 09/01/2022 1503   CREATININE 0.94 03/03/2014 1022   CALCIUM 9.1 09/01/2022 1503   CALCIUM 9.4 03/03/2014 1022   PROT 7.1 09/01/2022 1503   PROT 7.4 03/03/2014 1022   ALBUMIN 4.2 09/01/2022 1503   ALBUMIN 4.1 03/03/2014 1022   AST 23 09/01/2022 1503   AST 29 03/03/2014 1022   ALT 22 09/01/2022 1503   ALT 53 03/03/2014 1022   ALKPHOS 56 09/01/2022 1503   ALKPHOS 81 03/03/2014 1022   BILITOT 0.4 09/01/2022 1503   BILITOT 0.4 03/03/2014 1022   GFRNONAA >60 09/01/2022 1503   GFRNONAA >60 03/03/2014 1022   GFRNONAA >60 09/02/2013 0939   GFRAA >60 08/21/2019 1031   GFRAA >60 03/03/2014 1022   GFRAA >60 09/02/2013 0939    No results found for: "SPEP", "UPEP"  Lab Results  Component Value Date   WBC 6.2 09/01/2022   NEUTROABS 2.4 09/01/2022   HGB 13.4 09/01/2022   HCT 39.1 09/01/2022   MCV 88.1 09/01/2022   PLT 252 09/01/2022      Chemistry      Component Value Date/Time   NA 135 09/01/2022 1503   NA 141 03/03/2014 1022   K 3.7 09/01/2022 1503   K 4.1 03/03/2014 1022   CL 103 09/01/2022 1503   CL 105 03/03/2014 1022   CO2 25 09/01/2022 1503   CO2 28 03/03/2014 1022   BUN 18 09/01/2022 1503   BUN 18 03/03/2014 1022   CREATININE 0.78 09/01/2022 1503   CREATININE 0.94 03/03/2014 1022      Component Value Date/Time   CALCIUM 9.1 09/01/2022 1503   CALCIUM 9.4 03/03/2014 1022   ALKPHOS  56 09/01/2022 1503   ALKPHOS 81 03/03/2014 1022   AST 23 09/01/2022 1503   AST 29 03/03/2014 1022   ALT  22 09/01/2022 1503   ALT 53 03/03/2014 1022   BILITOT 0.4 09/01/2022 1503   BILITOT 0.4 03/03/2014 1022       RADIOGRAPHIC STUDIES: I have personally reviewed the radiological images as listed and agreed with the findings in the report. No results found.   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  CT scan FEB 2023 [[facility]]-negative for any lymphadenopathy- stable- Labs today within normal limits.  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   # parking placard- arthritis- given.   # Disposition:  # follow-up 6 m-MD /lab-CBC CMP [facility]- Dr.B   No orders of the defined types were placed in this encounter.    Earna Coder, MD 09/01/2022 3:39 PM

## 2023-03-02 ENCOUNTER — Inpatient Hospital Stay: Payer: Medicare Other | Admitting: Nurse Practitioner

## 2023-03-02 ENCOUNTER — Inpatient Hospital Stay (HOSPITAL_BASED_OUTPATIENT_CLINIC_OR_DEPARTMENT_OTHER): Payer: Medicare Other | Admitting: Nurse Practitioner

## 2023-03-02 ENCOUNTER — Inpatient Hospital Stay: Payer: Medicare Other

## 2023-03-02 ENCOUNTER — Inpatient Hospital Stay: Payer: Medicare Other | Attending: Internal Medicine

## 2023-03-02 ENCOUNTER — Encounter: Payer: Self-pay | Admitting: Nurse Practitioner

## 2023-03-02 VITALS — BP 116/86 | HR 78 | Temp 97.4°F | Wt 169.0 lb

## 2023-03-02 DIAGNOSIS — C8203 Follicular lymphoma grade I, intra-abdominal lymph nodes: Secondary | ICD-10-CM | POA: Insufficient documentation

## 2023-03-02 DIAGNOSIS — Z79899 Other long term (current) drug therapy: Secondary | ICD-10-CM | POA: Insufficient documentation

## 2023-03-02 LAB — CBC WITH DIFFERENTIAL (CANCER CENTER ONLY)
Abs Immature Granulocytes: 0.02 10*3/uL (ref 0.00–0.07)
Basophils Absolute: 0 10*3/uL (ref 0.0–0.1)
Basophils Relative: 1 %
Eosinophils Absolute: 0.1 10*3/uL (ref 0.0–0.5)
Eosinophils Relative: 1 %
HCT: 41.8 % (ref 36.0–46.0)
Hemoglobin: 14.1 g/dL (ref 12.0–15.0)
Immature Granulocytes: 0 %
Lymphocytes Relative: 43 %
Lymphs Abs: 2.5 10*3/uL (ref 0.7–4.0)
MCH: 29.6 pg (ref 26.0–34.0)
MCHC: 33.7 g/dL (ref 30.0–36.0)
MCV: 87.8 fL (ref 80.0–100.0)
Monocytes Absolute: 0.7 10*3/uL (ref 0.1–1.0)
Monocytes Relative: 12 %
Neutro Abs: 2.5 10*3/uL (ref 1.7–7.7)
Neutrophils Relative %: 43 %
Platelet Count: 252 10*3/uL (ref 150–400)
RBC: 4.76 MIL/uL (ref 3.87–5.11)
RDW: 13.2 % (ref 11.5–15.5)
WBC Count: 5.8 10*3/uL (ref 4.0–10.5)
nRBC: 0 % (ref 0.0–0.2)

## 2023-03-02 LAB — CMP (CANCER CENTER ONLY)
ALT: 23 U/L (ref 0–44)
AST: 23 U/L (ref 15–41)
Albumin: 4.1 g/dL (ref 3.5–5.0)
Alkaline Phosphatase: 54 U/L (ref 38–126)
Anion gap: 9 (ref 5–15)
BUN: 13 mg/dL (ref 8–23)
CO2: 25 mmol/L (ref 22–32)
Calcium: 9 mg/dL (ref 8.9–10.3)
Chloride: 102 mmol/L (ref 98–111)
Creatinine: 0.84 mg/dL (ref 0.44–1.00)
GFR, Estimated: >60 mL/min (ref >60)
Glucose, Bld: 99 mg/dL (ref 70–99)
Potassium: 3.7 mmol/L (ref 3.5–5.1)
Sodium: 136 mmol/L (ref 135–145)
Total Bilirubin: 0.3 mg/dL (ref 0.3–1.2)
Total Protein: 7.1 g/dL (ref 6.5–8.1)

## 2023-03-02 LAB — LACTATE DEHYDROGENASE: LDH: 112 U/L (ref 98–192)

## 2023-03-02 NOTE — Progress Notes (Signed)
[facility] OFFICE PROGRESS NOTE  Patient Care Team: Patrice Paradise, MD as PCP - General (Physician Assistant) Earna Coder, MD as Consulting Physician (Hematology and Oncology)   Cancer Staging  No matching staging information was found for the patient.   Oncology History Overview Note    1.FEB 2013-  MESENTERIC LYMPHADENOPATHY;] Pet positive lymphadenopathy.; . 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 3. Rituxan weekly x4 finished in March of 2013 4. PET scan in July of 2013 complete remission. 5. Maintenance Rituxan started in July of 2013;  x 2 years [finished 2015]  #April 2019-melanoma/left side of the nose [2020]/ Left arm [2021][Dr. Kowalski/Dr.Dasher]   # LFTs- slightly up/ ? faty liver.  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  Ambulating independently.  82 year old female patient above history for follow-up of lymphoma, status post Rituxan maintenance, was completed in 2015 is here for follow-up. She denies fevers, chills, night sweats. Denies chills. No new lumps or masses.    REVIEW OF SYSTEMS:  Review of Systems  Constitutional: Negative.  Negative for chills, fever, malaise/fatigue and weight loss.  HENT:  Negative for congestion, ear pain and tinnitus.   Eyes: Negative.  Negative for blurred vision and double vision.  Respiratory: Negative.  Negative for cough, sputum production and shortness of breath.   Cardiovascular: Negative.  Negative for chest pain, palpitations and leg swelling.  Gastrointestinal: Negative.  Negative for abdominal pain, constipation, diarrhea, nausea and vomiting.  Musculoskeletal:  Positive for back pain and joint pain.  Negative for falls.  Neurological: Negative.  Negative for weakness and headaches.  Endo/Heme/Allergies: Negative.  Does not bruise/bleed easily.  Psychiatric/Behavioral: Negative.  Negative for depression. The patient is not nervous/anxious and does not have insomnia.     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   Hyperlipidemia    Hypertension    Melanoma (HCC)    Migraine    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   Non Hodgkin's lymphoma (HCC)    Non Hodgkin's lymphoma (HCC)    RAD (reactive airway disease)    Skin cancer     PAST SURGICAL HISTORY :   Past Surgical History:  Procedure Laterality Date   ABDOMINAL HYSTERECTOMY     APPENDECTOMY     BASAL CELL CARCINOMA EXCISION     on both sides of nose   BREAST BIOPSY Left 12/2013   hyperplasia   CATARACT EXTRACTION W/PHACO Right 01/10/2022   Procedure: CATARACT EXTRACTION PHACO AND INTRAOCULAR LENS PLACEMENT (IOC) RIGHT;  Surgeon: Galen Manila, MD;  Location: [facility];  Service: Ophthalmology;  Laterality: Right;  7.85 0:38.1   CATARACT EXTRACTION W/PHACO Left 01/31/2022   Procedure: CATARACT EXTRACTION PHACO AND INTRAOCULAR LENS  PLACEMENT (IOC) LEFT 8.45 00:46.1;  Surgeon: Galen Manila, MD;  Location: [facility];  Service: Ophthalmology;  Laterality: Left;   CHOLECYSTECTOMY     COLONOSCOPY N/A 09/16/2021   Procedure: COLONOSCOPY;  Surgeon: Midge Minium, MD;  Location: [facility];  Service: Endoscopy;  Laterality: N/A;   COLONOSCOPY WITH PROPOFOL N/A 06/05/2017   Procedure: COLONOSCOPY WITH  PROPOFOL;  Surgeon: Christena Deem, MD;  Location: [facility] ENDOSCOPY;  Service: Endoscopy;  Laterality: N/A;   COLONOSCOPY WITH PROPOFOL N/A 05/14/2020   Procedure: COLONOSCOPY WITH PROPOFOL;  Surgeon: Regis Bill, MD;  Location: [facility] ENDOSCOPY;  Service: Endoscopy;  Laterality: N/A;   ESOPHAGOGASTRODUODENOSCOPY (EGD) WITH PROPOFOL N/A 03/30/2015   Procedure: ESOPHAGOGASTRODUODENOSCOPY (EGD) WITH PROPOFOL;  Surgeon: Christena Deem, MD;  Location: [facility] ENDOSCOPY;  Service: Endoscopy;  Laterality: N/A;   ESOPHAGOGASTRODUODENOSCOPY (EGD) WITH PROPOFOL N/A 06/05/2017   Procedure: ESOPHAGOGASTRODUODENOSCOPY (EGD) WITH PROPOFOL;  Surgeon: Christena Deem, MD;  Location: [facility] END[facility]COPY;  Service: Endoscopy;  Laterality: N/A;   ESOPHAGOGASTRODUODENOSCOPY (EGD) WITH PROPOFOL N/A 05/14/2020   Procedure: ESOPHAGOGASTRODUODENOSCOPY (EGD) WITH PROPOFOL;  Surgeon: Regis Bill, MD;  Location: [facility] ENDOSCOPY;  Service: Endoscopy;  Laterality: N/A;   FACIAL COSMETIC SURGERY     history of multiple colonic polyps     lymph node removal from left groin     OOPHORECTOMY     VENTRAL HERNIA REPAIR N/A 10/02/2014   Procedure: HERNIA REPAIR VENTRAL ADULT;  Surgeon: Nadeen Landau, MD;  Location: [facility] ORS;  Service: General;  Laterality: N/A;  with mesh    FAMILY HISTORY :   Family History  Problem Relation Age of Onset   Alcohol abuse Father    Stroke Father    Diabetes Sister    Kidney cancer Sister    Melanoma Brother    Diabetes Brother    Heart attack Brother    Breast cancer Neg Hx    Bladder Cancer Neg Hx    Prostate cancer Neg Hx     SOCIAL HISTORY:   Social History   Tobacco Use   Smoking status: Never   Smokeless tobacco: Never  Vaping Use   Vaping status: Never Used  Substance Use Topics   Alcohol use: Not Currently    Alcohol/week: 1.0 standard drink of alcohol    Types: 1 Cans of beer per week    Comment: rare social occasions only   Drug use: Never     ALLERGIES:  is allergic to buprenorphine hcl, dilaudid [hydromorphone hcl], and morphine and codeine.  MEDICATIONS:  Current Outpatient Medications  Medication Sig Dispense Refill   diphenhydrAMINE (BENADRYL) 25 MG tablet Take 25 mg by mouth at bedtime as needed for sleep.     lisinopril (PRINIVIL,ZESTRIL) 10 MG tablet Take 10 mg by mouth daily.     omeprazole (PRILOSEC) 20 MG capsule Take 20 mg by mouth daily.     Probiotic Product (PROBIOTIC-10) CHEW Chew 1 tablet by mouth daily.     rosuvastatin (CRESTOR) 5 MG tablet Take 5 mg by mouth daily.     No current facility-administered medications for this visit.    PHYSICAL EXAMINATION: ECOG PERFORMANCE STATUS: 0 - Asymptomatic  BP 116/86 (BP Location: Left Arm, Patient Position: Sitting)   Pulse 78   Temp (!) 97.4 F (36.3 C) (Tympanic)   Wt 169 lb (76.7 kg)   SpO2 100%   BMI 30.91 kg/m   Filed Weights   03/02/23 1124  Weight: 169 lb (76.7 kg)   Physical  Exam Vitals reviewed.  Constitutional:      Appearance: She is not ill-appearing.  Cardiovascular:     Rate and Rhythm: Normal rate and regular rhythm.  Pulmonary:     Effort: No respiratory distress.  Abdominal:     General: There is no distension.     Palpations: Abdomen is soft.     Tenderness: There is no abdominal tenderness. There is no guarding.  Musculoskeletal:        General: No deformity.     Right lower leg: No edema.     Left lower leg: No edema.  Lymphadenopathy:     Cervical: No cervical adenopathy.  Skin:    General: Skin is warm and dry.     Coloration: Skin is not pale.  Neurological:     Mental Status: She is alert and oriented to person, place, and time. Mental status is at baseline.  Psychiatric:        Mood and Affect: Mood normal.        Behavior: Behavior normal.    LABORATORY DATA:  I have reviewed the data as listed    Component Value Date/Time   NA 136 03/02/2023 1101   NA 141 03/03/2014 1022   K 3.7 03/02/2023 1101   K  4.1 03/03/2014 1022   CL 102 03/02/2023 1101   CL 105 03/03/2014 1022   CO2 25 03/02/2023 1101   CO2 28 03/03/2014 1022   GLUCOSE 99 03/02/2023 1101   GLUCOSE 112 (H) 03/03/2014 1022   BUN 13 03/02/2023 1101   BUN 18 03/03/2014 1022   CREATININE 0.84 03/02/2023 1101   CREATININE 0.94 03/03/2014 1022   CALCIUM 9.0 03/02/2023 1101   CALCIUM 9.4 03/03/2014 1022   PROT 7.1 03/02/2023 1101   PROT 7.4 03/03/2014 1022   ALBUMIN 4.1 03/02/2023 1101   ALBUMIN 4.1 03/03/2014 1022   AST 23 03/02/2023 1101   ALT 23 03/02/2023 1101   ALT 53 03/03/2014 1022   ALKPHOS 54 03/02/2023 1101   ALKPHOS 81 03/03/2014 1022   BILITOT 0.3 03/02/2023 1101   GFRNONAA >60 03/02/2023 1101   GFRNONAA >60 03/03/2014 1022   GFRNONAA >60 09/02/2013 0939   GFRAA >60 08/21/2019 1031   GFRAA >60 03/03/2014 1022   GFRAA >60 09/02/2013 0939   Lab Results  Component Value Date   WBC 5.8 03/02/2023   NEUTROABS 2.5 03/02/2023   HGB 14.1 03/02/2023   HCT 41.8 03/02/2023   MCV 87.8 03/02/2023   PLT 252 03/02/2023     Chemistry      Component Value Date/Time   NA 136 03/02/2023 1101   NA 141 03/03/2014 1022   K 3.7 03/02/2023 1101   K 4.1 03/03/2014 1022   CL 102 03/02/2023 1101   CL 105 03/03/2014 1022   CO2 25 03/02/2023 1101   CO2 28 03/03/2014 1022   BUN 13 03/02/2023 1101   BUN 18 03/03/2014 1022   CREATININE 0.84 03/02/2023 1101   CREATININE 0.94 03/03/2014 1022      Component Value Date/Time   CALCIUM 9.0 03/02/2023 1101   CALCIUM 9.4 03/03/2014 1022   ALKPHOS 54 03/02/2023 1101   ALKPHOS 81 03/03/2014 1022   AST 23 03/02/2023 1101   ALT 23 03/02/2023 1101   ALT 53 03/03/2014 1022   BILITOT 0.3 03/02/2023 1101      RADIOGRAPHIC STUDIES: I have personally reviewed the radiological images as listed and agreed with the findings in the report. No results found.  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 CT scan Feb 2023- [facility]. Negative for lymphadenopathy. Labs today reviewed. Stable. Clinically asymptomatic of recurrence. Consider imaging if concerning symptoms.    # Hx of melanoma x2 [facial 2019] s/p surgery; Dr.Dasher- Continue close surveillance with dermatology - stable.     # parking placard- arthritis- previously provided   Disposition:  6 mo- lab (cbc, cmp, ldh), Dr Donneta Romberg- [place]  No problem-specific Assessment & Plan notes found for this encounter.  No orders of the defined types were placed in this encounter.  Alinda Dooms, NP 03/02/2023

## 2023-03-21 ENCOUNTER — Inpatient Hospital Stay
Admission: EM | Admit: 2023-03-21 | Discharge: 2023-03-23 | DRG: 390 | Disposition: A | Discharge status: Home / Self Care | Payer: Medicare Other | Attending: Internal Medicine | Admitting: Internal Medicine

## 2023-03-21 ENCOUNTER — Emergency Department: Payer: Medicare Other

## 2023-03-21 ENCOUNTER — Other Ambulatory Visit: Payer: Self-pay

## 2023-03-21 DIAGNOSIS — Z823 Family history of stroke: Secondary | ICD-10-CM | POA: Diagnosis not present

## 2023-03-21 DIAGNOSIS — E669 Obesity, unspecified: Secondary | ICD-10-CM | POA: Diagnosis present

## 2023-03-21 DIAGNOSIS — K2270 Barrett's esophagus without dysplasia: Secondary | ICD-10-CM | POA: Diagnosis present

## 2023-03-21 DIAGNOSIS — Z79899 Other long term (current) drug therapy: Secondary | ICD-10-CM

## 2023-03-21 DIAGNOSIS — Z8572 Personal history of non-Hodgkin lymphomas: Secondary | ICD-10-CM

## 2023-03-21 DIAGNOSIS — Z808 Family history of malignant neoplasm of other organs or systems: Secondary | ICD-10-CM

## 2023-03-21 DIAGNOSIS — K219 Gastro-esophageal reflux disease without esophagitis: Secondary | ICD-10-CM | POA: Diagnosis present

## 2023-03-21 DIAGNOSIS — K529 Noninfective gastroenteritis and colitis, unspecified: Secondary | ICD-10-CM

## 2023-03-21 DIAGNOSIS — Z833 Family history of diabetes mellitus: Secondary | ICD-10-CM | POA: Diagnosis not present

## 2023-03-21 DIAGNOSIS — Z6831 Body mass index (BMI) 31.0-31.9, adult: Secondary | ICD-10-CM | POA: Diagnosis not present

## 2023-03-21 DIAGNOSIS — Z885 Allergy status to narcotic agent status: Secondary | ICD-10-CM | POA: Diagnosis not present

## 2023-03-21 DIAGNOSIS — K56609 Unspecified intestinal obstruction, unspecified as to partial versus complete obstruction: Secondary | ICD-10-CM | POA: Diagnosis present

## 2023-03-21 DIAGNOSIS — I10 Essential (primary) hypertension: Secondary | ICD-10-CM | POA: Diagnosis present

## 2023-03-21 DIAGNOSIS — K56600 Partial intestinal obstruction, unspecified as to cause: Secondary | ICD-10-CM | POA: Diagnosis present

## 2023-03-21 DIAGNOSIS — Z961 Presence of intraocular lens: Secondary | ICD-10-CM | POA: Diagnosis present

## 2023-03-21 DIAGNOSIS — Z9841 Cataract extraction status, right eye: Secondary | ICD-10-CM

## 2023-03-21 DIAGNOSIS — Z8249 Family history of ischemic heart disease and other diseases of the circulatory system: Secondary | ICD-10-CM | POA: Diagnosis not present

## 2023-03-21 DIAGNOSIS — C8590 Non-Hodgkin lymphoma, unspecified, unspecified site: Secondary | ICD-10-CM | POA: Diagnosis present

## 2023-03-21 DIAGNOSIS — Z85820 Personal history of malignant melanoma of skin: Secondary | ICD-10-CM

## 2023-03-21 DIAGNOSIS — R55 Syncope and collapse: Secondary | ICD-10-CM | POA: Diagnosis present

## 2023-03-21 DIAGNOSIS — Z9842 Cataract extraction status, left eye: Secondary | ICD-10-CM

## 2023-03-21 DIAGNOSIS — Z8051 Family history of malignant neoplasm of kidney: Secondary | ICD-10-CM

## 2023-03-21 DIAGNOSIS — K567 Ileus, unspecified: Secondary | ICD-10-CM | POA: Diagnosis present

## 2023-03-21 DIAGNOSIS — Z888 Allergy status to other drugs, medicaments and biological substances status: Secondary | ICD-10-CM

## 2023-03-21 DIAGNOSIS — Z85828 Personal history of other malignant neoplasm of skin: Secondary | ICD-10-CM

## 2023-03-21 DIAGNOSIS — E785 Hyperlipidemia, unspecified: Secondary | ICD-10-CM | POA: Diagnosis present

## 2023-03-21 DIAGNOSIS — R0902 Hypoxemia: Secondary | ICD-10-CM | POA: Diagnosis present

## 2023-03-21 LAB — CBC WITH DIFFERENTIAL/PLATELET
Abs Immature Granulocytes: 0.04 10*3/uL (ref 0.00–0.07)
Basophils Absolute: 0 10*3/uL (ref 0.0–0.1)
Basophils Relative: 0 %
Eosinophils Absolute: 0.1 10*3/uL (ref 0.0–0.5)
Eosinophils Relative: 1 %
HCT: 42.1 % (ref 36.0–46.0)
Hemoglobin: 14.5 g/dL (ref 12.0–15.0)
Immature Granulocytes: 0 %
Lymphocytes Relative: 29 %
Lymphs Abs: 2.9 10*3/uL (ref 0.7–4.0)
MCH: 30.1 pg (ref 26.0–34.0)
MCHC: 34.4 g/dL (ref 30.0–36.0)
MCV: 87.5 fL (ref 80.0–100.0)
Monocytes Absolute: 0.6 10*3/uL (ref 0.1–1.0)
Monocytes Relative: 6 %
Neutro Abs: 6.3 10*3/uL (ref 1.7–7.7)
Neutrophils Relative %: 64 %
Platelets: 311 10*3/uL (ref 150–400)
RBC: 4.81 MIL/uL (ref 3.87–5.11)
RDW: 13.5 % (ref 11.5–15.5)
WBC: 9.9 10*3/uL (ref 4.0–10.5)
nRBC: 0 % (ref 0.0–0.2)

## 2023-03-21 LAB — COMPREHENSIVE METABOLIC PANEL
ALT: 18 U/L (ref 0–44)
AST: 19 U/L (ref 15–41)
Albumin: 4.5 g/dL (ref 3.5–5.0)
Alkaline Phosphatase: 59 U/L (ref 38–126)
Anion gap: 10 (ref 5–15)
BUN: 16 mg/dL (ref 8–23)
CO2: 22 mmol/L (ref 22–32)
Calcium: 9.3 mg/dL (ref 8.9–10.3)
Chloride: 105 mmol/L (ref 98–111)
Creatinine, Ser: 0.79 mg/dL (ref 0.44–1.00)
GFR, Estimated: >60 mL/min (ref >60)
Glucose, Bld: 124 mg/dL — ABNORMAL HIGH (ref 70–99)
Potassium: 3.5 mmol/L (ref 3.5–5.1)
Sodium: 137 mmol/L (ref 135–145)
Total Bilirubin: 0.7 mg/dL (ref <1.2)
Total Protein: 7.8 g/dL (ref 6.5–8.1)

## 2023-03-21 LAB — TYPE AND SCREEN
ABO/RH(D): O POS
Antibody Screen: NEGATIVE

## 2023-03-21 LAB — MAGNESIUM: Magnesium: 2 mg/dL (ref 1.7–2.4)

## 2023-03-21 LAB — BRAIN NATRIURETIC PEPTIDE: B Natriuretic Peptide: 22.4 pg/mL (ref 0.0–100.0)

## 2023-03-21 LAB — PROTIME-INR
INR: 1 (ref 0.8–1.2)
Prothrombin Time: 13.8 s (ref 11.4–15.2)

## 2023-03-21 LAB — TROPONIN I (HIGH SENSITIVITY): Troponin I (High Sensitivity): 7 ng/L (ref <18)

## 2023-03-21 LAB — APTT: aPTT: 26 s (ref 24–36)

## 2023-03-21 LAB — LIPASE, BLOOD: Lipase: 29 U/L (ref 11–51)

## 2023-03-21 MED ORDER — ACETAMINOPHEN 650 MG RE SUPP: 650.0000 mg | Freq: Four times a day (QID) | RECTAL | Status: DC | PRN

## 2023-03-21 MED ORDER — FENTANYL CITRATE PF 50 MCG/ML IJ SOSY
12.5000 ug | PREFILLED_SYRINGE | INTRAMUSCULAR | Status: DC | PRN
  Administered 2023-03-22: 12.5 ug via INTRAVENOUS
  Filled 2023-03-21 (×2): qty 1

## 2023-03-21 MED ORDER — SODIUM CHLORIDE 0.9 % IV SOLN: INTRAVENOUS | Status: DC

## 2023-03-21 MED ORDER — FENTANYL CITRATE PF 50 MCG/ML IJ SOSY
50.0000 ug | PREFILLED_SYRINGE | Freq: Once | INTRAMUSCULAR | Status: AC
  Administered 2023-03-21: 50 ug via INTRAVENOUS
  Filled 2023-03-21: qty 1

## 2023-03-21 MED ORDER — IOHEXOL 350 MG/ML SOLN
100.0000 mL | Freq: Once | INTRAVENOUS | Status: AC | PRN
  Administered 2023-03-21: 100 mL via INTRAVENOUS

## 2023-03-21 MED ORDER — SODIUM CHLORIDE 0.9 % IV BOLUS
500.0000 mL | Freq: Once | INTRAVENOUS | Status: AC
  Administered 2023-03-21: 500 mL via INTRAVENOUS

## 2023-03-21 MED ORDER — HYDRALAZINE HCL 20 MG/ML IJ SOLN: 5.0000 mg | INTRAMUSCULAR | Status: DC | PRN

## 2023-03-21 MED ORDER — ONDANSETRON HCL 4 MG/2ML IJ SOLN
4.0000 mg | Freq: Three times a day (TID) | INTRAMUSCULAR | Status: DC | PRN
  Administered 2023-03-21: 4 mg via INTRAVENOUS
  Filled 2023-03-21: qty 2

## 2023-03-21 MED ORDER — SODIUM CHLORIDE 0.9 % IV BOLUS
1000.0000 mL | Freq: Once | INTRAVENOUS | Status: AC
  Administered 2023-03-21: 1000 mL via INTRAVENOUS

## 2023-03-21 NOTE — H&P (Signed)
History and Physical    [name] WJX:[id number] DOB: 02-19-1941 DOA: 03/21/2023  Referring MD/NP/PA:   PCP: Patrice Paradise, MD   Patient coming from:  The patient is coming from home.     Chief Complaint: abdominal pain  HPI: [name] is a 82 y.o. female with medical history significant of SBO, HTN. HLD, GERD, NHL, essential tremor, GERD, Barrett's esophagus, migraine, melanoma, who presents with abdominal pain.  Found to have small bowel obstruction.  Patient states that her abdominal pain started 10:30 last night, which is located in the bilateral side of her abdomen, associated with mild nausea, no vomiting.  Patient states that she has had multiple episodes of loose stool bowel movement until 2:30 this morning.  Currently no active nausea, vomiting.  Denies chest pain, cough, SOB.  No fever or chills.  Per report, patient had syncope episode in triage, found to have oxygen desaturation to 84% on room air and low blood pressure to 82/55 which improved to 112/72 after giving 1 L normal saline bolus.  Her oxygen saturation is 100% on room air when I saw patient in ED.   Data reviewed independently and ED Course: pt was found to have WBC 9.9, GFR> 60, trop 7. Temperature normal, heart rate 81, RR 18.  CTA negative for PE.  Patient is admitted to telemetry bed as inpatient.  Dr. Claudine Mouton of surgery is consulted.  CT abdomen/pelvis: Dilated small bowel loops into the pelvis. Distal small bowel loops are decompressed. Findings compatible with mid to distal small bowel obstruction.  EKG: I have personally reviewed. Sinus rhythm, QTc 436, PVC, low voltage, Q-wave in lead III/aVF    Review of Systems:   General: no fevers, chills, no body weight gain, has poor appetite, has fatigue HEENT: no blurry vision, hearing changes or sore throat Respiratory: no dyspnea, coughing, wheezing CV: no chest pain, no palpitations GI: has nausea, no vomiting, has abdominal pain, no  constipation GU: no dysuria, burning on urination, increased urinary frequency, hematuria  Ext: no leg edema Neuro: no unilateral weakness, numbness, or tingling, no vision change or hearing loss. Has syncope Skin: no rash, no skin tear. MSK: No muscle spasm, no deformity, no limitation of range of movement in spin Heme: No easy bruising.  Travel history: No recent long distant travel.   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   Hyperlipidemia    Hypertension    Melanoma (HCC)    Migraine    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   Non Hodgkin's lymphoma (HCC)    Non Hodgkin's lymphoma (HCC)    RAD (reactive airway disease)    Skin cancer     Past Surgical History:  Procedure Laterality Date   ABDOMINAL HYSTERECTOMY     APPENDECTOMY  BASAL CELL CARCINOMA EXCISION     on both sides of nose   BREAST BIOPSY Left 12/2013   hyperplasia   CATARACT EXTRACTION W/PHACO Right 01/10/2022   Procedure: CATARACT EXTRACTION PHACO AND INTRAOCULAR LENS PLACEMENT (IOC) RIGHT;  Surgeon: Galen Manila, MD;  Location: [facility];  Service: Ophthalmology;  Laterality: Right;  7.85 0:38.1   CATARACT EXTRACTION W/PHACO Left 01/31/2022   Procedure: CATARACT EXTRACTION PHACO AND INTRAOCULAR LENS PLACEMENT (IOC) LEFT 8.45 00:46.1;  Surgeon: Galen Manila, MD;  Location: [facility];  Service:  Ophthalmology;  Laterality: Left;   CHOLECYSTECTOMY     COLONOSCOPY N/A 09/16/2021   Procedure: COLONOSCOPY;  Surgeon: Midge Minium, MD;  Location: [facility];  Service: Endoscopy;  Laterality: N/A;   COLONOSCOPY WITH PROPOFOL N/A 06/05/2017   Procedure: COLONOSCOPY WITH PROPOFOL;  Surgeon: Christena Deem, MD;  Location: [facility];  Service: Endoscopy;  Laterality: N/A;   COLONOSCOPY WITH PROPOFOL N/A 05/14/2020   Procedure: COLONOSCOPY WITH PROPOFOL;  Surgeon: Regis Bill, MD;  Location: [facility];  Service: Endoscopy;  Laterality: N/A;   ESOPHAGOGASTRODUODENOSCOPY (EGD) WITH PROPOFOL N/A 03/30/2015   Procedure: ESOPHAGOGASTRODUODENOSCOPY (EGD) WITH PROPOFOL;  Surgeon: Christena Deem, MD;  Location: [facility] END[facility]COPY;  Service: Endoscopy;  Laterality: N/A;   ESOPHAGOGASTRODUODENOSCOPY (EGD) WITH PROPOFOL N/A 06/05/2017   Procedure: ESOPHAGOGASTRODUODENOSCOPY (EGD) WITH PROPOFOL;  Surgeon: Christena Deem, MD;  Location: [facility];  Service: Endoscopy;  Laterality: N/A;   ESOPHAGOGASTRODUODENOSCOPY (EGD) WITH PROPOFOL N/A 05/14/2020   Procedure: ESOPHAGOGASTRODUODENOSCOPY (EGD) WITH PROPOFOL;  Surgeon: Regis Bill, MD;  Location: [facility] ENDOS[facility];  Service: Endoscopy;  Laterality: N/A;   FACIAL COSMETIC SURGERY     history of multiple colonic polyps     lymph node removal from left groin     OOPHORECTOMY     VENTRAL HERNIA REPAIR N/A 10/02/2014   Procedure: HERNIA REPAIR VENTRAL ADULT;  Surgeon: Nadeen Landau, MD;  Location: [facility] OR[facility];  Service: General;  Laterality: N/A;  with mesh    Social History:  reports that she has never smoked. She has never used smokeless tobacco. She reports that she does not currently use alcohol after a past usage of about 1.0 standard drink of alcohol per week. She reports that she does not use drugs.  Family History:  Family History  Problem Relation Age of Onset   Alcohol abuse Father    Stroke Father    Diabetes  Sister    Kidney cancer Sister    Melanoma Brother    Diabetes Brother    Heart attack Brother    Breast cancer Neg Hx    Bladder Cancer Neg Hx    Prostate cancer Neg Hx      Prior to Admission medications   Medication Sig Start Date End Date Taking? Authorizing Provider  diphenhydrAMINE (BENADRYL) 25 MG tablet Take 25 mg by mouth at bedtime as needed for sleep.    [provider]  lisinopril (PRINIVIL,ZESTRIL) 10 MG tablet Take 10 mg by mouth daily.    [provider]  omeprazole (PRILOSEC) 20 MG capsule Take 20 mg by mouth daily.    [provider]  Probiotic Product (PROBIOTIC-10) CHEW Chew 1 tablet by mouth daily.    [provider]  rosuvastatin (CRESTOR) 5 MG tablet Take 5 mg by mouth daily.    [provider]    Physical Exam: Vitals:   03/21/23 1838 03/21/23 1845 03/21/23 1845 03/21/23 2220  BP: 112/72   (!) 152/82  Pulse: 81   82  Resp:    19  Temp:   98.6 F (37 C) 97.9 F (36.6 C)  TempSrc:   Axillary Oral  SpO2: 93% 100%  94%  Weight:      Height:       General: Not in acute distress HEENT:       Eyes: PERRL, EOMI, no jaundice       ENT: No discharge from the ears and nose, no pharynx injection, no tonsillar enlargement.        Neck: No JVD, no bruit, no mass felt. Heme: No neck lymph node enlargement. Cardiac: S1/S2, RRR, No murmurs, No gallops or rubs. Respiratory: No rales, wheezing, rhonchi or rubs. GI: Soft, nondistended, has tenderness in bilateral sides of abdomen, no rebound pain, no organomegaly, BS present. GU: No hematuria Ext: No pitting leg edema bilaterally. 1+DP/PT pulse bilaterally. Musculoskeletal: No joint deformities, No joint redness or warmth, no limitation of ROM in spin. Skin: No rashes.  Neuro: Alert, oriented X3, cranial nerves II-XII grossly intact, moves all extremities normally. Psych: Patient is not psychotic, no suicidal or hemocidal ideation.  Labs on Admission: I have personally  reviewed following labs and imaging studies  CBC: Recent Labs  Lab 03/21/23 1830  WBC 9.9  NEUTROABS 6.3  HGB 14.5  HCT 42.1  MCV 87.5  PLT 311   Basic Metabolic Panel: Recent Labs  Lab 03/21/23 1830  NA 137  K 3.5  CL 105  CO2 22  GLUCOSE 124*  BUN 16  CREATININE 0.79  CALCIUM 9.3  MG 2.0   GFR: Estimated Creatinine Clearance: 53 mL/min (by C-G formula based on SCr of 0.79 mg/dL). Liver Function Tests: Recent Labs  Lab 03/21/23 1830  AST 19  ALT 18  ALKPHOS 59  BILITOT 0.7  PROT 7.8  ALBUMIN 4.5   Recent Labs  Lab 03/21/23 1830  LIPASE 29   No results for input(s): "AMMONIA" in the last 168 hours. Coagulation Profile: Recent Labs  Lab 03/21/23 2230  INR 1.0   Cardiac Enzymes: No results for input(s): "CKTOTAL", "CKMB", "CKMBINDEX", "TROPONINI" in the last 168 hours. BNP (last 3 results) No results for input(s): "PROBNP" in the last 8760 hours. HbA1C: No results for input(s): "HGBA1C" in the last 72 hours. CBG: No results for input(s): "GLUCAP" in the last 168 hours. Lipid Profile: No results for input(s): "CHOL", "HDL", "LDLCALC", "TRIG", "CHOLHDL", "LDLDIRECT" in the last 72 hours. Thyroid Function Tests: No results for input(s): "TSH", "T4TOTAL", "FREET4", "T3FREE", "THYROIDAB" in the last 72 hours. Anemia Panel: No results for input(s): "VITAMINB12", "FOLATE", "FERRITIN", "TIBC", "IRON", "RETICCTPCT" in the last 72 hours. Urine analysis:    Component Value Date/Time   COLORURINE YELLOW (A) 03/15/2022 1813   APPEARANCEUR CLEAR (A) 03/15/2022 1813   APPEARANCEUR Clear 03/20/2019 1126   LABSPEC 1.008 03/15/2022 1813   LABSPEC 1.006 08/23/2012 1638   PHURINE 5.0 03/15/2022 1813   GLUCOSEU NEGATIVE 03/15/2022 1813   GLUCOSEU Negative 08/23/2012 1638   HGBUR SMALL (A) 03/15/2022 1813   BILIRUBINUR NEGATIVE 03/15/2022 1813   BILIRUBINUR Negative 03/20/2019 1126   BILIRUBINUR Negative 08/23/2012 1638   KETONESUR NEGATIVE 03/15/2022 1813    PROTEINUR NEGATIVE 03/15/2022 1813   NITRITE NEGATIVE 03/15/2022 1813   LEUKOCYTESUR MODERATE (A) 03/15/2022 1813   LEUKOCYTESUR 3+ 08/23/2012 1638   Sepsis Labs: @LABRCNTIP (procalcitonin:4,lacticidven:4) )No results found for this or any previous visit (from the past 240 hour(s)).   Radiological Exams on Admission: CT ABDOMEN PELVIS W CONTRAST  Result Date: 03/21/2023 CLINICAL DATA:  Left lower quadrant pain EXAM: CT ABDOMEN AND PELVIS WITH CONTRAST TECHNIQUE: Multidetector CT imaging of the abdomen and pelvis was performed using the standard protocol following bolus administration of intravenous contrast. RADIATION DOSE REDUCTION: This exam was performed according to the departmental dose-optimization program which includes automated exposure control, adjustment of the mA and/or kV according to patient size and/or use of iterative reconstruction technique. CONTRAST:  OMNIPAQUE IOHEXOL 350 MG/ML SOLN COMPARISON:  03/15/2022 FINDINGS: Lower chest: Minimal dependent and bibasilar atelectasis. Hepatobiliary: Scattered hypodensities in the liver are stable since prior study and most compatible with cysts. Prior cholecystectomy. Pancreas: No focal abnormality or ductal dilatation. Spleen: No focal abnormality.  Normal size. Adrenals/Urinary Tract: No suspicious renal or adrenal lesion. No stones or hydronephrosis. Urinary bladder unremarkable. Stomach/Bowel: Dilated small bowel loops extend into the left pelvis. Decompressed distal small bowel. Findings concerning for mid to distal small bowel obstruction. Large bowel decompressed and grossly unremarkable. Vascular/Lymphatic: No evidence of aneurysm or adenopathy. Reproductive: Prior hysterectomy.  No adnexal masses. Other: No free fluid or free air. Musculoskeletal: No acute bony abnormality. IMPRESSION: Dilated small bowel loops into the pelvis. Distal small bowel loops are decompressed. Findings compatible with mid to distal small bowel  obstruction. Electronically Signed   By: Charlett Nose M.D.   On: 03/21/2023 20:30   CT Angio Chest PE W/Cm &/Or Wo Cm  Result Date: 03/21/2023 CLINICAL DATA:  Pulmonary embolism (PE) suspected, high prob. Syncope. EXAM: CT ANGIOGRAPHY CHEST WITH CONTRAST TECHNIQUE: Multidetector CT imaging of the chest was performed using the standard protocol during bolus administration of intravenous contrast. Multiplanar CT image reconstructions and MIPs were obtained to evaluate the vascular anatomy. RADIATION DOSE REDUCTION: This exam was performed according to the departmental dose-optimization program which includes automated exposure control, adjustment of the mA and/or kV according to patient size and/or use of iterative reconstruction technique. CONTRAST:  OMNIPAQUE IOHEXOL 350 MG/ML SOLN COMPARISON:  03/21/2017 FINDINGS: Cardiovascular: No filling defects in the pulmonary arteries to suggest pulmonary emboli. Heart borderline in size. Scattered coronary artery and aortic calcifications. No evidence of aortic aneurysm. Mediastinum/Nodes: No mediastinal, hilar, or axillary adenopathy. Trachea and esophagus are unremarkable. Thyroid unremarkable. Lungs/Pleura: No confluent opacities or effusions. Minimal dependent and bibasilar atelectasis. Upper Abdomen: No acute findings Musculoskeletal: Chest wall soft tissues are unremarkable. No acute bony abnormality. Review of the MIP images confirms the above findings. IMPRESSION: No evidence of pulmonary embolus. No acute cardiopulmonary disease. Borderline heart size.  Scattered coronary artery calcifications. Aortic Atherosclerosis (ICD10-I70.0). Electronically Signed   By: Charlett Nose M.D.   On: 03/21/2023 20:27      Assessment/Plan Principal Problem:   SBO (small bowel obstruction) (HCC) Active Problems:   Syncope   Essential hypertension   HLD (hyperlipidemia)   Non-Hodgkin's lymphoma (HCC)   Obesity (BMI 30-39.9)   Assessment and Plan:  SBO (small  bowel obstruction) (HCC): Patient does not have active nausea vomiting currently.  Consulted Dr. Claudine Mouton of surgery -Admitted to telemetry bed as inpatient due to syncope -Hold off NG tube now -As needed fentanyl for pain -As needed Zofran for nausea -IV fluid  Syncope: Possibly due to vasovagal episode.  No focal neurodeficit on physical examination.  Patient had low blood pressure which responded to IV fluid quickly. -Frequent neurocheck -Fall precaution -IV fluid: 1.5 L normal saline, then 75 cc/h  Essential hypertension -Hold blood pressure medications: Lisinopril  HLD (hyperlipidemia) -Crestor  Non-Hodgkin's lymphoma (HCC): S/p of Rituxan: WBC  9.9 today -Follow-up with hematologist  Obesity (BMI 30-39.9): Body weight 77.1 kg, BMI 31.09 -Encourage losing weight -Exercise and healthy diet     DVT ppx: SCD  Code Status: Full code    Family Communication:  Yes, patient's husband  at bed side.   Disposition Plan:  Anticipate discharge back to previous environment  Consults called: Dr. Claudine Mouton of surgery  Admission status and Level of care: Telemetry Medical:    as inpt      Dispo: The patient is from: Home              Anticipated d/c is to: Home              Anticipated d/c date is: 2 days              Patient currently is not medically stable to d/c.    Severity of Illness:  The appropriate patient status for this patient is INPATIENT. Inpatient status is judged to be reasonable and necessary in order to provide the required intensity of service to ensure the patient's safety. The patient's presenting symptoms, physical exam findings, and initial radiographic and laboratory data in the context of their chronic comorbidities is felt to place them at high risk for further clinical deterioration. Furthermore, it is not anticipated that the patient will be medically stable for discharge from the hospital within 2 midnights of admission.   * I certify that at the  point of admission it is my clinical judgment that the patient will require inpatient hospital care spanning beyond 2 midnights from the point of admission due to high intensity of service, high risk for further deterioration and high frequency of surveillance required.*       Date of Service 03/22/2023    Lorretta Harp [facility]   If 7PM-7AM, please contact night-coverage [email or web address] 03/22/2023, 12:29 AM

## 2023-03-21 NOTE — ED Provider Notes (Signed)
[facility] Provider Note    Event Date/Time   First MD Initiated Contact with Patient 03/21/23 1836     (approximate)   History   Abdominal Pain   HPI  [name] is a 82 y.o. female past medical history significant for non-Hodgkin's lymphoma,  cholecystectomy, who presents to the emergency department following episode of abdominal pain.  Patient states that she started having severe generalized abdominal pain this morning.  Worsens throughout the day.  Does state that she has a history of small bowel obstructions but this 1 feels much worse.  Pain is worse on the left side.  Denies any nausea, vomiting or diarrhea.  Not passing gas.  Whenever patient was being hooked up for an EKG had a syncopal episode in triage, became bradycardic and hypotensive and hypoxic.  Resolved and patient states that she is having ongoing abdominal pain.  Denies any chest pain or shortness of breath.  Denies any history of DVT or PE.  Not on anticoagulation.     Physical Exam   Triage Vital Signs: ED Triage Vitals  Encounter Vitals Group     BP 03/21/23 1830 (!) 82/55     Systolic BP Percentile --      Diastolic BP Percentile --      Pulse Rate 03/21/23 1832 64     Resp 03/21/23 1832 18     Temp --      Temp src --      SpO2 03/21/23 1832 (!) 84 %     Weight 03/21/23 1831 170 lb (77.1 kg)     Height 03/21/23 1831 5\' 2"  (1.575 m)     Head Circumference --      Peak Flow --      Pain Score 03/21/23 1831 10     Pain Loc --      Pain Education --      Exclude from Growth Chart --     Most recent vital signs: Vitals:   03/21/23 1845 03/21/23 1845  BP:    Pulse:    Resp:    Temp:  98.6 F (37 C)  SpO2: 100%     Physical Exam Constitutional:      Appearance: She is well-developed.  HENT:     Head: Atraumatic.  Eyes:     Conjunctiva/sclera: Conjunctivae normal.  Cardiovascular:     Rate and Rhythm: Regular rhythm.  Pulmonary:     Effort: No respiratory  distress.     Comments: 84% on room air, placed on 2 L nasal cannula with improvement to 100%. Abdominal:     General: There is no distension.     Tenderness: There is abdominal tenderness in the left upper quadrant and left lower quadrant.  Musculoskeletal:        General: Normal range of motion.     Cervical back: Normal range of motion.  Skin:    General: Skin is warm.     Capillary Refill: Capillary refill takes less than 2 seconds.  Neurological:     Mental Status: She is alert. Mental status is at baseline.     IMPRESSION / MDM / ASSESSMENT AND PLAN / ED COURSE  I reviewed the triage vital signs and the nursing notes.  Differential diagnosis including vasovagal episode, abdominal perforation, small bowel obstruction, pulmonary embolism, dehydration, ACS  EKG  I, Corena Herter, the attending physician, personally viewed and interpreted this ECG.  Initial EKG with heart rate of 65.  Normal intervals.  ST depression to lead III.  No significant ST elevation, questionable small area of ST elevation in aVF.  EKG repeated in 20 minutes.  EKG now with normal sinus rhythm.  Normal intervals.  No chamber enlargement.  No significant ST elevation or depression.  No findings of acute ischemia.  No tachycardic or bradycardic dysrhythmias while on cardiac telemetry.  RADIOLOGY I independently reviewed imaging, my interpretation of imaging: CT abdomen and pelvis with findings concerning for possible small bowel obstruction.  CT scan read as dilated small bowel loops concerning for mid to distal small bowel obstruction.  CTA with no signs of pulmonary embolism.  No signs of acute findings.  Coronary artery calcifications.  LABS (all labs ordered are listed, but only abnormal results are displayed) Labs interpreted as -    Labs Reviewed  COMPREHENSIVE METABOLIC PANEL - Abnormal; Notable for the following components:      Result Value   Glucose, Bld 124 (*)    All other components  within normal limits  CBC WITH DIFFERENTIAL/PLATELET  LIPASE, BLOOD  MAGNESIUM  BRAIN NATRIURETIC PEPTIDE  URINALYSIS, W/ REFLEX TO CULTURE (INFECTION SUSPECTED)  TROPONIN I (HIGH SENSITIVITY)  TROPONIN I (HIGH SENSITIVITY)     MDM    Patient given IV fentanyl for pain control.  Given 1 L of IV fluid given her episode of hypotension.  On reevaluation significant improvement of her blood pressure following 1 L of IV fluids.  No leukocytosis or significant electrolyte abnormality.  No elevation in troponin have low suspicion for ACS or dysrhythmia.  Most likely had a vagal episode secondary to pain.  Consulted hospitalist for admission for small bowel obstruction.   PROCEDURES:  Critical Care performed: yes  .Critical Care  Performed by: Corena Herter, MD Authorized by: Corena Herter, MD   Critical care provider statement:    Critical care time (minutes):  30   Critical care time was exclusive of:  Separately billable procedures and treating other patients   Critical care was necessary to treat or prevent imminent or life-threatening deterioration of the following conditions:  Circulatory failure   Critical care was time spent personally by me on the following activities:  Development of treatment plan with patient or surrogate, discussions with consultants, evaluation of patient's response to treatment, examination of patient, ordering and review of laboratory studies, ordering and review of radiographic studies, ordering and performing treatments and interventions, pulse oximetry, re-evaluation of patient's condition and review of old charts   Care discussed with: admitting provider     Patient's presentation is most consistent with acute presentation with potential threat to life or bodily function.   MEDICATIONS ORDERED IN ED: Medications  sodium chloride 0.9 % bolus 1,000 mL (1,000 mLs Intravenous New Bag/Given 03/21/23 1841)  fentaNYL (SUBLIMAZE) injection 50 mcg (50  mcg Intravenous Given 03/21/23 1857)  iohexol (OMNIPAQUE) 350 MG/ML injection 100 mL (100 mLs Intravenous Contrast Given 03/21/23 1921)    FINAL CLINICAL IMPRESSION(S) / ED DIAGNOSES   Final diagnoses:  Vaso vagal episode  SBO (small bowel obstruction) (HCC)     Rx / DC Orders   ED Discharge Orders     None        Note:  This document was prepared using Dragon voice recognition software and may include unintentional dictation errors.   Corena Herter, MD 03/21/23 2055

## 2023-03-21 NOTE — ED Triage Notes (Signed)
Patient to ED via POV for generalized abd pain that started this AM. Denies N/V/D. Patient noted to have syncopal episode while being triaged. Patient pale and clammy.

## 2023-03-22 DIAGNOSIS — K529 Noninfective gastroenteritis and colitis, unspecified: Secondary | ICD-10-CM | POA: Diagnosis not present

## 2023-03-22 DIAGNOSIS — K56609 Unspecified intestinal obstruction, unspecified as to partial versus complete obstruction: Secondary | ICD-10-CM | POA: Diagnosis not present

## 2023-03-22 LAB — BASIC METABOLIC PANEL
Anion gap: 9 (ref 5–15)
BUN: 14 mg/dL (ref 8–23)
CO2: 20 mmol/L — ABNORMAL LOW (ref 22–32)
Calcium: 8.3 mg/dL — ABNORMAL LOW (ref 8.9–10.3)
Chloride: 110 mmol/L (ref 98–111)
Creatinine, Ser: 0.61 mg/dL (ref 0.44–1.00)
GFR, Estimated: >60 mL/min (ref >60)
Glucose, Bld: 136 mg/dL — ABNORMAL HIGH (ref 70–99)
Potassium: 3.9 mmol/L (ref 3.5–5.1)
Sodium: 139 mmol/L (ref 135–145)

## 2023-03-22 LAB — GASTROINTESTINAL PANEL BY PCR, STOOL (REPLACES STOOL CULTURE)

## 2023-03-22 LAB — CBC
HCT: 38.2 % (ref 36.0–46.0)
Hemoglobin: 12.9 g/dL (ref 12.0–15.0)
MCH: 30 pg (ref 26.0–34.0)
MCHC: 33.8 g/dL (ref 30.0–36.0)
MCV: 88.8 fL (ref 80.0–100.0)
Platelets: 219 10*3/uL (ref 150–400)
RBC: 4.3 MIL/uL (ref 3.87–5.11)
RDW: 13.5 % (ref 11.5–15.5)
WBC: 9.3 10*3/uL (ref 4.0–10.5)
nRBC: 0 % (ref 0.0–0.2)

## 2023-03-22 LAB — GLUCOSE, CAPILLARY: Glucose-Capillary: 108 mg/dL — ABNORMAL HIGH (ref 70–99)

## 2023-03-22 MED ORDER — ACETAMINOPHEN 325 MG PO TABS
650.0000 mg | ORAL_TABLET | Freq: Four times a day (QID) | ORAL | Status: DC | PRN
  Administered 2023-03-22 – 2023-03-23 (×2): 650 mg via ORAL
  Filled 2023-03-22 (×2): qty 2

## 2023-03-22 MED ORDER — PANTOPRAZOLE SODIUM 40 MG PO TBEC
40.0000 mg | DELAYED_RELEASE_TABLET | Freq: Every day | ORAL | Status: DC
  Administered 2023-03-22 – 2023-03-23 (×2): 40 mg via ORAL
  Filled 2023-03-22 (×2): qty 1

## 2023-03-22 MED ORDER — ROSUVASTATIN CALCIUM 10 MG PO TABS
5.0000 mg | ORAL_TABLET | Freq: Every day | ORAL | Status: DC
  Administered 2023-03-22 – 2023-03-23 (×2): 5 mg via ORAL
  Filled 2023-03-22 (×2): qty 1

## 2023-03-22 NOTE — Progress Notes (Addendum)
PROGRESS NOTE    [name]  AOZ:[id number] DOB: 07-26-1940 DOA: 03/21/2023 PCP: Patrice Paradise, MD   Assessment & Plan:   Principal Problem:   SBO (small bowel obstruction) (HCC) Active Problems:   Syncope   Essential hypertension   HLD (hyperlipidemia)   Non-Hodgkin's lymphoma (HCC)   Obesity (BMI 30-39.9)  Assessment and Plan:  Partial SBO vs ileus vs gastroenteritis: started on clear liquid diet as per gen surg. No need for NG tube currently. Zofran prn for nausea/vomiting. Continue on IVFs   Syncope: possibly due to vasovagal episode.  No focal neurodeficit on physical examination. Had low blood pressure which responded to IVFs. Continue on IVFs   HTN: holding home dose of lisinopril    HLD: continue on statin    Non-Hodgkin's lymphoma: s/p of rituxan. Management per onco outpatient    Obesity: BMI 31.0. Complicates overall care & prognosis       DVT prophylaxis: SCDs Code Status: full  Family Communication:  Disposition Plan: likely d/c back home   Level of care: Telemetry Medical Status is: Inpatient Remains inpatient appropriate because: severity of illness    Consultants:  Gen surg   Procedures:   Antimicrobials:  Subjective: Pt c/o abd discomfort   Objective: Vitals:   03/21/23 1845 03/21/23 2220 03/22/23 0454 03/22/23 0728  BP:  (!) 152/82 (!) 109/55 (!) 109/57  Pulse:  82 79 78  Resp:  19 19 16   Temp: 98.6 F (37 C) 97.9 F (36.6 C) 98 F (36.7 C) 97.7 F (36.5 C)  TempSrc: Axillary Oral Oral Oral  SpO2:  94% 94% 94%  Weight:      Height:        Intake/Output Summary (Last 24 hours) at 03/22/2023 0824 Last data filed at 03/22/2023 0449 Gross per 24 hour  Intake 377.5 ml  Output --  Net 377.5 ml   Filed Weights   03/21/23 1831  Weight: 77.1 kg    Examination:  General exam: Appears calm and comfortable  Respiratory system: Clear to auscultation. Respiratory effort normal. Cardiovascular system: S1 & S2 +. No  rubs, gallops or clicks. Gastrointestinal system: Abdomen is  obese, soft and nontender. Hypoactive bowel sounds heard. Central nervous system: Alert and oriented. Moves all extremities  Psychiatry: Judgement and insight appear normal. Flat mood and affect     Data Reviewed: I have personally reviewed following labs and imaging studies  CBC: Recent Labs  Lab 03/21/23 1830 03/22/23 0434  WBC 9.9 9.3  NEUTROABS 6.3  --   HGB 14.5 12.9  HCT 42.1 38.2  MCV 87.5 88.8  PLT 311 219   Basic Metabolic Panel: Recent Labs  Lab 03/21/23 1830 03/22/23 0434  NA 137 139  K 3.5 3.9  CL 105 110  CO2 22 20*  GLUCOSE 124* 136*  BUN 16 14  CREATININE 0.79 0.61  CALCIUM 9.3 8.3*  MG 2.0  --    GFR: Estimated Creatinine Clearance: 53 mL/min (by C-G formula based on SCr of 0.61 mg/dL). Liver Function Tests: Recent Labs  Lab 03/21/23 1830  AST 19  ALT 18  ALKPHOS 59  BILITOT 0.7  PROT 7.8  ALBUMIN 4.5   Recent Labs  Lab 03/21/23 1830  LIPASE 29   No results for input(s): "AMMONIA" in the last 168 hours. Coagulation Profile: Recent Labs  Lab 03/21/23 2230  INR 1.0   Cardiac Enzymes: No results for input(s): "CKTOTAL", "CKMB", "CKMBINDEX", "TROPONINI" in the last 168 hours. BNP (last 3 results)  No results for input(s): "PROBNP" in the last 8760 hours. HbA1C: No results for input(s): "HGBA1C" in the last 72 hours. CBG: Recent Labs  Lab 03/22/23 0730  GLUCAP 108*   Lipid Profile: No results for input(s): "CHOL", "HDL", "LDLCALC", "TRIG", "CHOLHDL", "LDLDIRECT" in the last 72 hours. Thyroid Function Tests: No results for input(s): "TSH", "T4TOTAL", "FREET4", "T3FREE", "THYROIDAB" in the last 72 hours. Anemia Panel: No results for input(s): "VITAMINB12", "FOLATE", "FERRITIN", "TIBC", "IRON", "RETICCTPCT" in the last 72 hours. Sepsis Labs: No results for input(s): "PROCALCITON", "LATICACIDVEN" in the last 168 hours.  No results found for this or any previous visit  (from the past 240 hour(s)).       Radiology Studies: CT ABDOMEN PELVIS W CONTRAST  Result Date: 03/21/2023 CLINICAL DATA:  Left lower quadrant pain EXAM: CT ABDOMEN AND PELVIS WITH CONTRAST TECHNIQUE: Multidetector CT imaging of the abdomen and pelvis was performed using the standard protocol following bolus administration of intravenous contrast. RADIATION DOSE REDUCTION: This exam was performed according to the departmental dose-optimization program which includes automated exposure control, adjustment of the mA and/or kV according to patient size and/or use of iterative reconstruction technique. CONTRAST:  OMNIPAQUE IOHEXOL 350 MG/ML SOLN COMPARISON:  03/15/2022 FINDINGS: Lower chest: Minimal dependent and bibasilar atelectasis. Hepatobiliary: Scattered hypodensities in the liver are stable since prior study and most compatible with cysts. Prior cholecystectomy. Pancreas: No focal abnormality or ductal dilatation. Spleen: No focal abnormality.  Normal size. Adrenals/Urinary Tract: No suspicious renal or adrenal lesion. No stones or hydronephrosis. Urinary bladder unremarkable. Stomach/Bowel: Dilated small bowel loops extend into the left pelvis. Decompressed distal small bowel. Findings concerning for mid to distal small bowel obstruction. Large bowel decompressed and grossly unremarkable. Vascular/Lymphatic: No evidence of aneurysm or adenopathy. Reproductive: Prior hysterectomy.  No adnexal masses. Other: No free fluid or free air. Musculoskeletal: No acute bony abnormality. IMPRESSION: Dilated small bowel loops into the pelvis. Distal small bowel loops are decompressed. Findings compatible with mid to distal small bowel obstruction. Electronically Signed   By: Charlett Nose M.D.   On: 03/21/2023 20:30   CT Angio Chest PE W/Cm &/Or Wo Cm  Result Date: 03/21/2023 CLINICAL DATA:  Pulmonary embolism (PE) suspected, high prob. Syncope. EXAM: CT ANGIOGRAPHY CHEST WITH CONTRAST TECHNIQUE:  Multidetector CT imaging of the chest was performed using the standard protocol during bolus administration of intravenous contrast. Multiplanar CT image reconstructions and MIPs were obtained to evaluate the vascular anatomy. RADIATION DOSE REDUCTION: This exam was performed according to the departmental dose-optimization program which includes automated exposure control, adjustment of the mA and/or kV according to patient size and/or use of iterative reconstruction technique. CONTRAST:  OMNIPAQUE IOHEXOL 350 MG/ML SOLN COMPARISON:  03/21/2017 FINDINGS: Cardiovascular: No filling defects in the pulmonary arteries to suggest pulmonary emboli. Heart borderline in size. Scattered coronary artery and aortic calcifications. No evidence of aortic aneurysm. Mediastinum/Nodes: No mediastinal, hilar, or axillary adenopathy. Trachea and esophagus are unremarkable. Thyroid unremarkable. Lungs/Pleura: No confluent opacities or effusions. Minimal dependent and bibasilar atelectasis. Upper Abdomen: No acute findings Musculoskeletal: Chest wall soft tissues are unremarkable. No acute bony abnormality. Review of the MIP images confirms the above findings. IMPRESSION: No evidence of pulmonary embolus. No acute cardiopulmonary disease. Borderline heart size.  Scattered coronary artery calcifications. Aortic Atherosclerosis (ICD10-I70.0). Electronically Signed   By: Charlett Nose M.D.   On: 03/21/2023 20:27        Scheduled Meds:  pantoprazole  40 mg Oral Daily   rosuvastatin  5 mg  Oral Daily   Continuous Infusions:  sodium chloride 75 mL/hr at 03/21/23 2347     LOS: 1 day      Charise Killian, MD [facility] Pager [phone]  If 7PM-7AM, please contact night-coverage [email or web address] 03/22/2023, 8:24 AM

## 2023-03-22 NOTE — TOC CM/SW Note (Signed)
Transition of Care [facility]) - Inpatient Brief Assessment   Patient Details  Name: [name] MRN: [id number] Date of Birth: Feb 15, 1941  Transition of Care [facility]) CM/SW Contact:    Chapman Fitch, RN Phone Number: 03/22/2023, 10:13 AM   Clinical Narrative:   Transition of Care [facility]) Screening Note   Patient Details  Name: [name] Date of Birth: 1941/02/03   Transition of Care [facility]) CM/SW Contact:    Chapman Fitch, RN Phone Number: 03/22/2023, 10:13 AM    Transition of Care Department [facility]) has reviewed patient and no TOC needs have been identified at this time.If new patient transition needs arise, please place a TOC consult.     Transition of Care Asessment: Insurance and Status: Insurance coverage has been reviewed Patient has primary care physician: Yes     Prior/Current Home Services: No current home services Social Determinants of Health Reivew: SDOH reviewed no interventions necessary Readmission risk has been reviewed: Yes Transition of care needs: no transition of care needs at this time

## 2023-03-22 NOTE — Consult Note (Signed)
[facility] SURGICAL CONSULTATION NOTE (initial) - cpt: 25366   HISTORY OF PRESENT ILLNESS (HPI):  82 y.o. female presented to [facility] ED last night for evaluation of abdominal pain. Patient reports around 24 hour history of left sided abdominal pain prior to arrival. This was described as sharp in nature. She reported accompanying nausea, and loose stools. No fever, chills, cough, CP, SOB, emesis. Interestingly, patient did have a syncopal episode in the ED while getting EKG. She does have a previous history of SBO. We did see her in November of last year for the same. This resolved quickly and did not need NGT decompression. Previous intra-abdominal surgeries positive for ventral hernia repair, cholecystectomy, abdominal hysterectomy, and appendectomy. Work up in the ED revealed a normal WBC to 9.9K, Hgb to 12.9, sCr normal at 0.61, no electrolyte derangements. She did have CT Abdomen/Pelvis which was concerning for possible partial SBO. She was admitted to medicine service. She had improvement in symptoms and NGT was not placed.   Surgery is consulted by hospitalist physician Dr. Lorretta Harp, MD in this context for evaluation and management of pSBO.  This morning, she reports feeling much better. Abdominal pain has resolved. She is no longer with any nausea nor emesis. She continues to have loose bowel movements.   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;  Service: Ophthalmology;  Laterality: Right;  7.85 0:38.1   CATARACT EXTRACTION W/PHACO Left 01/31/2022   Procedure: CATARACT EXTRACTION PHACO AND INTRAOCULAR LENS PLACEMENT (IOC) LEFT 8.45 00:46.1;  Surgeon: Galen Manila, MD;  Location: [facility];  Service: Ophthalmology;  Laterality: Left;   CHOLECYSTECTOMY     COLONOSCOPY N/A 09/16/2021   Procedure: COLONOSCOPY;  Surgeon: Midge Minium, MD;  Location: [facility];  Service: Endoscopy;  Laterality: N/A;   COLONOSCOPY WITH PROPOFOL N/A 06/05/2017   Procedure: COLONOSCOPY WITH PROPOFOL;  Surgeon: Christena Deem, MD;  Location: [facility] ENDOSCOPY;  Service: Endoscopy;  Laterality: N/A;   COLONOSCOPY WITH PROPOFOL N/A 05/14/2020   Procedure: COLONOSCOPY WITH PROPOFOL;  Surgeon: Regis Bill, MD;  Location: [facility] ENDOSCOPY;  Service: Endoscopy;  Laterality: N/A;   ESOPHAGOGASTRODUODENOSCOPY (EGD) WITH PROPOFOL N/A 03/30/2015   Procedure: ESOPHAGOGASTRODUODENOSCOPY (EGD) WITH PROPOFOL;  Surgeon: Cindra Eves  Marva Panda, MD;  Location: [facility] ENDOSCOPY;  Service: Endoscopy;  Laterality: N/A;   ESOPHAGOGASTRODUODENOSCOPY (EGD) WITH PROPOFOL N/A 06/05/2017   Procedure: ESOPHAGOGASTRODUODENOSCOPY (EGD) WITH PROPOFOL;  Surgeon: Christena Deem, MD;  Location: [facility] ENDOSCOPY;  Service: Endoscopy;  Laterality: N/A;   ESOPHAGOGASTRODUODENOSCOPY (EGD) WITH PROPOFOL N/A 05/14/2020   Procedure: ESOPHAGOGASTRODUODENOSCOPY (EGD)  WITH PROPOFOL;  Surgeon: Regis Bill, MD;  Location: [facility] ENDOSCOPY;  Service: Endoscopy;  Laterality: N/A;   FACIAL COSMETIC SURGERY     history of multiple colonic polyps     lymph node removal from left groin     OOPHORECTOMY     VENTRAL HERNIA REPAIR N/A 10/02/2014   Procedure: HERNIA REPAIR VENTRAL ADULT;  Surgeon: Nadeen Landau, MD;  Location: [facility] ORS;  Service: General;  Laterality: N/A;  with mesh     MEDICATIONS:  Prior to Admission medications   Medication Sig Start Date End Date Taking? Authorizing Provider  diphenhydrAMINE (BENADRYL) 25 MG tablet Take 25 mg by mouth at bedtime as needed for sleep.    [provider]  lisinopril (PRINIVIL,ZESTRIL) 10 MG tablet Take 10 mg by mouth daily.    [provider]  omeprazole (PRILOSEC) 20 MG capsule Take 20 mg by mouth daily.    [provider]  Probiotic Product (PROBIOTIC-10) CHEW Chew 1 tablet by mouth daily.    [provider]  rosuvastatin (CRESTOR) 5 MG tablet Take 5 mg by mouth daily.    [provider]     ALLERGIES:  Allergies  Allergen Reactions   Buprenorphine Hcl Nausea And Vomiting   Dilaudid [Hydromorphone Hcl] Anxiety and Other (See Comments)    Hypotension    Morphine And Codeine Nausea And Vomiting     SOCIAL HISTORY:  Social History   Socioeconomic History   Marital status: Married    Spouse name: Not on file   Number of children: Not on file   Years of education: Not on file   Highest education level: Not on file  Occupational History   Not on file  Tobacco Use   Smoking status: Never   Smokeless tobacco: Never  Vaping Use   Vaping status: Never Used  Substance and Sexual Activity   Alcohol use: Not Currently    Alcohol/week: 1.0 standard drink of alcohol    Types: 1 Cans of beer per week    Comment: rare social occasions only   Drug use: Never   Sexual activity: Never  Other Topics Concern   Not on file  Social History Narrative    Not on file   Social Determinants of Health   Financial Resource Strain: Low Risk  (02/21/2023)   Received from [facility]   Overall Financial Resource Strain (CARDIA)    Difficulty of Paying Living Expenses: Not hard at all  Food Insecurity: No Food Insecurity (03/21/2023)   Hunger Vital Sign    Worried About Running Out of Food in the Last Year: Never true    Ran Out of Food in the Last Year: Never true  Transportation Needs: No Transportation Needs (03/21/2023)   PRAPARE - Administrator, Civil Service (Medical): No    Lack of Transportation (Non-Medical): No  Physical Activity: Not on file  Stress: Not on file  Social Connections: Not on file  Intimate Partner Violence: Not At Risk (03/21/2023)   Humiliation, Afraid, Rape, and Kick questionnaire    Fear of Current or Ex-Partner: No    Emotionally Abused: No  Physically Abused: No    Sexually Abused: No     FAMILY HISTORY:  Family History  Problem Relation Age of Onset   Alcohol abuse Father    Stroke Father    Diabetes Sister    Kidney cancer Sister    Melanoma Brother    Diabetes Brother    Heart attack Brother    Breast cancer Neg Hx    Bladder Cancer Neg Hx    Prostate cancer Neg Hx       REVIEW OF SYSTEMS:  Review of Systems  Constitutional:  Negative for chills and fever.  Respiratory:  Negative for cough and shortness of breath.   Cardiovascular:  Negative for chest pain and palpitations.  Gastrointestinal:  Positive for abdominal pain (Resolved), diarrhea, nausea (Resolved) and vomiting (Resolved). Negative for blood in stool and constipation.  Genitourinary:  Negative for dysuria and urgency.  Neurological:  Positive for loss of consciousness (In ED). Negative for dizziness and headaches.  All other systems reviewed and are negative.   VITAL SIGNS:  Temp:  [97.7 F (36.5 C)-98.6 F (37 C)] 97.7 F (36.5 C) (11/14 0728) Pulse Rate:  [64-82] 78 (11/14 0728) Resp:   [16-19] 16 (11/14 0728) BP: (82-152)/(55-82) 109/57 (11/14 0728) SpO2:  [84 %-100 %] 94 % (11/14 0728) Weight:  [77.1 kg] 77.1 kg (11/13 1831)     Height: 5\' 2"  (157.5 cm) Weight: 77.1 kg BMI (Calculated): 31.09   INTAKE/OUTPUT:  11/13 0701 - 11/14 0700 In: 377.5 [I.V.:377.5] Out: -   PHYSICAL EXAM:  Physical Exam Vitals and nursing note reviewed. Exam conducted with a chaperone present.  Constitutional:      General: She is not in acute distress.    Appearance: She is well-developed. She is not ill-appearing.     Comments: Patient resting in bed; NAD  HENT:     Head: Normocephalic and atraumatic.  Eyes:     General: No scleral icterus.    Extraocular Movements: Extraocular movements intact.  Cardiovascular:     Rate and Rhythm: Normal rate.     Heart sounds: Normal heart sounds.  Pulmonary:     Effort: Pulmonary effort is normal. No respiratory distress.  Abdominal:     General: Abdomen is flat. A surgical scar is present. There is no distension.     Palpations: Abdomen is soft.     Tenderness: There is no abdominal tenderness. There is no guarding or rebound.     Comments: Abdomen is soft, non-tender, non-distended, no rebound/guarding. She is certainly without peritonitis   Genitourinary:    Comments: Deferred Skin:    General: Skin is warm and dry.  Neurological:     General: No focal deficit present.     Mental Status: She is alert and oriented to person, place, and time.  Psychiatric:        Mood and Affect: Mood normal.        Behavior: Behavior normal.      Labs:     Latest Ref Rng & Units 03/22/2023    4:34 AM 03/21/2023    6:30 PM 03/02/2023   11:01 AM  CBC  WBC 4.0 - 10.5 K/uL 9.3  9.9  5.8   Hemoglobin 12.0 - 15.0 g/dL 78.2  95.6  21.3   Hematocrit 36.0 - 46.0 % 38.2  42.1  41.8   Platelets 150 - 400 K/uL 219  311  252       Latest Ref Rng & Units 03/22/2023  4:34 AM 03/21/2023    6:30 PM 03/02/2023   11:01 AM  CMP  Glucose 70 - 99 mg/dL  213  086  99   BUN 8 - 23 mg/dL 14  16  13    Creatinine 0.44 - 1.00 mg/dL 5.78  4.69  6.29   Sodium 135 - 145 mmol/L 139  137  136   Potassium 3.5 - 5.1 mmol/L 3.9  3.5  3.7   Chloride 98 - 111 mmol/L 110  105  102   CO2 22 - 32 mmol/L 20  22  25    Calcium 8.9 - 10.3 mg/dL 8.3  9.3  9.0   Total Protein 6.5 - 8.1 g/dL  7.8  7.1   Total Bilirubin <1.2 mg/dL  0.7  0.3   Alkaline Phos 38 - 126 U/L  59  54   AST 15 - 41 U/L  19  23   ALT 0 - 44 U/L  18  23      Imaging studies:   CT Abdomen/Pelvis (03/21/2023) personally reviewed with loops of mildly dilated small bowel, gradual transition, no free air, no pneumatosis, and radiologist report reviewed below:  IMPRESSION: Dilated small bowel loops into the pelvis. Distal small bowel loops are decompressed. Findings compatible with mid to distal small bowel obstruction.   Assessment/Plan: (ICD-10's: K5.609) 82 y.o. female with now resolved abdominal pain, nausea, and diarrhea.   - Given her symptoms on presentation and persistent diarrhea vs loose stool and now rapid improvement without interventions, I would favor gastroenteritis rather than true SBO. pSBO is certainly a possibility given her surgical history but less likely given her persistent bowel function and rapid improvements   - I do think it is reasonable to trial CLD - NO role for NGT placement at this time - No need for surgical intervention. She did ask about elective laparoscopy and LOA; however, I do not think this is warranted at this time, at least not during this admission unless she would clinically deteriorate or fail to improve. I am not convinced this is truly and obstructive process nor has she presented >3 times in the last year for similar.  - Monitor abdominal examination; on-going bowel function  - Pain control prn; antiemetics prn - Mobilize as tolerated   - Further management per primary service; we will follow   - Discharge Planning: She is doing much  better this morning. Will trial CLD. If she does well with continued bowel function can likely advance diet and potentially DC in next 24-48 hours.   All of the above findings and recommendations were discussed with the patient and her family (husband), and all of their questions were answered to their expressed satisfaction.  Thank you for the opportunity to participate in this patient's care.   -- Lynden Oxford, PA-C [facility] 03/22/2023, 7:34 AM M-F: 7am - 4pm

## 2023-03-22 NOTE — Plan of Care (Signed)
  Problem: Coping: Goal: Level of anxiety will decrease Outcome: Progressing   Problem: Activity: Goal: Risk for activity intolerance will decrease Outcome: Progressing   Problem: Elimination: Goal: Will not experience complications related to bowel motility Outcome: Progressing Goal: Will not experience complications related to urinary retention Outcome: Progressing

## 2023-03-23 DIAGNOSIS — K529 Noninfective gastroenteritis and colitis, unspecified: Secondary | ICD-10-CM

## 2023-03-23 DIAGNOSIS — K56609 Unspecified intestinal obstruction, unspecified as to partial versus complete obstruction: Secondary | ICD-10-CM | POA: Diagnosis not present

## 2023-03-23 LAB — BASIC METABOLIC PANEL
Anion gap: 5 (ref 5–15)
BUN: 9 mg/dL (ref 8–23)
CO2: 22 mmol/L (ref 22–32)
Calcium: 8.7 mg/dL — ABNORMAL LOW (ref 8.9–10.3)
Chloride: 112 mmol/L — ABNORMAL HIGH (ref 98–111)
Creatinine, Ser: 0.78 mg/dL (ref 0.44–1.00)
GFR, Estimated: >60 mL/min (ref >60)
Glucose, Bld: 95 mg/dL (ref 70–99)
Potassium: 4 mmol/L (ref 3.5–5.1)
Sodium: 139 mmol/L (ref 135–145)

## 2023-03-23 NOTE — Progress Notes (Signed)
[facility] SURGICAL PROGRESS NOTE (cpt [phone])  Hospital Day(s): 2.   Interval History: Patient seen and examined, no acute events or new complaints overnight. Patient reports she is feeling much better this morning. No abdominal pain, nausea, emesis, fever, chills, She did have loose stools this morning again but these are slowing. BMP pending this AM. She has tolerated CLD. No further issue.   Review of Systems:  Constitutional: denies fever, chills  HEENT: denies cough or congestion  Respiratory: denies any shortness of breath  Cardiovascular: denies chest pain or palpitations  Gastrointestinal: denies abdominal pain, N/V Genitourinary: denies burning with urination or urinary frequency Musculoskeletal: denies pain, decreased motor or sensation  Vital signs in last 24 hours: [min-max] current  Temp:  [97.8 F (36.6 C)-98.2 F (36.8 C)] 97.8 F (36.6 C) (11/15 0818) Pulse Rate:  [55-75] 74 (11/15 0818) Resp:  [16-18] 16 (11/15 0818) BP: (116-127)/(55-76) 116/76 (11/15 0818) SpO2:  [95 %-98 %] 97 % (11/15 0818)     Height: 5\' 2"  (157.5 cm) Weight: 77.1 kg BMI (Calculated): 31.09   Intake/Output last 2 shifts:  No intake/output data recorded.   Physical Exam:  Constitutional: alert, cooperative and no distress  HENT: normocephalic without obvious abnormality  Eyes: PERRL, EOM's grossly intact and symmetric  Respiratory: breathing non-labored at rest  Cardiovascular: regular rate and sinus rhythm  Gastrointestinal: soft, non-tender, and non-distended Musculoskeletal: no edema or wounds, motor and sensation grossly intact, NT    Labs:     Latest Ref Rng & Units 03/22/2023    4:34 AM 03/21/2023    6:30 PM 03/02/2023   11:01 AM  CBC  WBC 4.0 - 10.5 K/uL 9.3  9.9  5.8   Hemoglobin 12.0 - 15.0 g/dL 13.0  86.5  78.4   Hematocrit 36.0 - 46.0 % 38.2  42.1  41.8   Platelets 150 - 400 K/uL 219  311  252       Latest Ref Rng & Units 03/22/2023    4:34 AM  03/21/2023    6:30 PM 03/02/2023   11:01 AM  CMP  Glucose 70 - 99 mg/dL 696  295  99   BUN 8 - 23 mg/dL 14  16  13    Creatinine 0.44 - 1.00 mg/dL 2.84  1.32  4.40   Sodium 135 - 145 mmol/L 139  137  136   Potassium 3.5 - 5.1 mmol/L 3.9  3.5  3.7   Chloride 98 - 111 mmol/L 110  105  102   CO2 22 - 32 mmol/L 20  22  25    Calcium 8.9 - 10.3 mg/dL 8.3  9.3  9.0   Total Protein 6.5 - 8.1 g/dL  7.8  7.1   Total Bilirubin <1.2 mg/dL  0.7  0.3   Alkaline Phos 38 - 126 U/L  59  54   AST 15 - 41 U/L  19  23   ALT 0 - 44 U/L  18  23      Imaging studies: No new pertinent imaging studies   Assessment/Plan: (ICD-10's: K59.609) 82 y.o. female with now resolved abdominal pain, nausea, and diarrhea.               - Again, favor gastroenteritis rather than obstructive process, which is now resolved.   - Will advance to soft diet  - Monitor abdominal examination; on-going bowel function             - Pain control prn; antiemetics prn -  Mobilize as tolerated              - Further management per primary service; we will follow               - Discharge Planning: She is doing well, no issues. Will advance to soft diet. Okay for DC from surgical perspective. She does not need to follow up with surgery.    All of the above findings and recommendations were discussed with the patient and her family (husband), and all of their questions were answered to their expressed satisfaction.    -- Lynden Oxford, PA-C [facility] 03/23/2023, 8:26 AM M-F: 7am - 4pm

## 2023-03-23 NOTE — Care Management Important Message (Signed)
Important Message  Patient Details  Name: UMME HOST MRN: [id number] Date of Birth: [birth year]   Important Message Given:  N/A - LOS <3 / Initial given by admissions     Olegario Messier A Mccrae Speciale 03/23/2023, 7:53 AM

## 2023-03-23 NOTE — Plan of Care (Signed)
Adequate for discharge.

## 2023-03-23 NOTE — Discharge Summary (Addendum)
Physician Discharge Summary  [name] QIO:[id number] DOB: April 28, 1941 DOA: 03/21/2023  PCP: Patrice Paradise, MD  Admit date: 03/21/2023 Discharge date: 03/23/2023  Admitted From: home  Disposition:  home   Recommendations for Outpatient Follow-up:  Follow up with PCP in 1-2 weeks   Home Health: no  Equipment/Devices:  Discharge Condition: stable  CODE STATUS: full  Diet recommendation: Regular   Brief/Interim Summary: HPI was taken from Dr. Clyde Lundborg:  [name] is a 82 y.o. female with medical history significant of SBO, HTN. HLD, GERD, NHL, essential tremor, GERD, Barrett's esophagus, migraine, melanoma, who presents with abdominal pain.  Found to have small bowel obstruction.   Patient states that her abdominal pain started 10:30 last night, which is located in the bilateral side of her abdomen, associated with mild nausea, no vomiting.  Patient states that she has had multiple episodes of loose stool bowel movement until 2:30 this morning.  Currently no active nausea, vomiting.  Denies chest pain, cough, SOB.  No fever or chills.  Per report, patient had syncope episode in triage, found to have oxygen desaturation to 84% on room air and low blood pressure to 82/55 which improved to 112/72 after giving 1 L normal saline bolus.  Her oxygen saturation is 100% on room air when I saw patient in ED.     Data reviewed independently and ED Course: pt was found to have WBC 9.9, GFR> 60, trop 7. Temperature normal, heart rate 81, RR 18.  CTA negative for PE.  Patient is admitted to telemetry bed as inpatient.  Dr. Claudine Mouton of surgery is consulted.   CT abdomen/pelvis: Dilated small bowel loops into the pelvis. Distal small bowel loops are decompressed. Findings compatible with mid to distal small bowel obstruction.    Discharge Diagnoses:  Principal Problem:   SBO (small bowel obstruction) (HCC) Active Problems:   Syncope   Essential hypertension   HLD (hyperlipidemia)    Non-Hodgkin's lymphoma (HCC)   Obesity (BMI 30-39.9)   Noninfectious gastroenteritis  Partial SBO vs ileus vs gastroenteritis: unclear dx. Advanced to soft diet and tolerating a soft diet. Zofran prn for nausea/vomiting. Continue on IVFs. Resolved. Gen surg following and recs apprec    Syncope: possibly due to vasovagal episode.  No focal neurodeficit on physical examination. Had low blood pressure which responded to IVFs. Continue on IVFs   HTN: restart home dose of lisinopril    HLD: continue on statin    Non-Hodgkin's lymphoma: s/p of rituxan. Management per onco outpatient    Obesity: BMI 31.0. Complicates overall care & prognosis   Discharge Instructions  Discharge Instructions     Diet general   Complete by: As directed    Discharge instructions   Complete by: As directed    F/u w/ PCP in 1-2 weeks   Increase activity slowly   Complete by: As directed       Allergies as of 03/23/2023       Reactions   Buprenorphine Hcl Nausea And Vomiting   Dilaudid [hydromorphone Hcl] Anxiety, Other (See Comments)   Hypotension    Morphine And Codeine Nausea And Vomiting        Medication List     TAKE these medications    diphenhydrAMINE 25 MG tablet Commonly known as: BENADRYL Take 25 mg by mouth at bedtime as needed for sleep.   lisinopril 10 MG tablet Commonly known as: ZESTRIL Take 10 mg by mouth daily.   omeprazole 20 MG capsule Commonly known  as: PRILOSEC Take 20 mg by mouth daily.   Probiotic-10 Chew Chew 1 tablet by mouth daily.   rosuvastatin 5 MG tablet Commonly known as: CRESTOR Take 5 mg by mouth daily.        Allergies  Allergen Reactions   Buprenorphine Hcl Nausea And Vomiting   Dilaudid [Hydromorphone Hcl] Anxiety and Other (See Comments)    Hypotension    Morphine And Codeine Nausea And Vomiting    Consultations: Gen surg    Procedures/Studies: CT ABDOMEN PELVIS W CONTRAST  Result Date: 03/21/2023 CLINICAL DATA:  Left lower  quadrant pain EXAM: CT ABDOMEN AND PELVIS WITH CONTRAST TECHNIQUE: Multidetector CT imaging of the abdomen and pelvis was performed using the standard protocol following bolus administration of intravenous contrast. RADIATION DOSE REDUCTION: This exam was performed according to the departmental dose-optimization program which includes automated exposure control, adjustment of the mA and/or kV according to patient size and/or use of iterative reconstruction technique. CONTRAST:  OMNIPAQUE IOHEXOL 350 MG/ML SOLN COMPARISON:  03/15/2022 FINDINGS: Lower chest: Minimal dependent and bibasilar atelectasis. Hepatobiliary: Scattered hypodensities in the liver are stable since prior study and most compatible with cysts. Prior cholecystectomy. Pancreas: No focal abnormality or ductal dilatation. Spleen: No focal abnormality.  Normal size. Adrenals/Urinary Tract: No suspicious renal or adrenal lesion. No stones or hydronephrosis. Urinary bladder unremarkable. Stomach/Bowel: Dilated small bowel loops extend into the left pelvis. Decompressed distal small bowel. Findings concerning for mid to distal small bowel obstruction. Large bowel decompressed and grossly unremarkable. Vascular/Lymphatic: No evidence of aneurysm or adenopathy. Reproductive: Prior hysterectomy.  No adnexal masses. Other: No free fluid or free air. Musculoskeletal: No acute bony abnormality. IMPRESSION: Dilated small bowel loops into the pelvis. Distal small bowel loops are decompressed. Findings compatible with mid to distal small bowel obstruction. Electronically Signed   By: Charlett Nose M.D.   On: 03/21/2023 20:30   CT Angio Chest PE W/Cm &/Or Wo Cm  Result Date: 03/21/2023 CLINICAL DATA:  Pulmonary embolism (PE) suspected, high prob. Syncope. EXAM: CT ANGIOGRAPHY CHEST WITH CONTRAST TECHNIQUE: Multidetector CT imaging of the chest was performed using the standard protocol during bolus administration of intravenous contrast. Multiplanar CT  image reconstructions and MIPs were obtained to evaluate the vascular anatomy. RADIATION DOSE REDUCTION: This exam was performed according to the departmental dose-optimization program which includes automated exposure control, adjustment of the mA and/or kV according to patient size and/or use of iterative reconstruction technique. CONTRAST:  OMNIPAQUE IOHEXOL 350 MG/ML SOLN COMPARISON:  03/21/2017 FINDINGS: Cardiovascular: No filling defects in the pulmonary arteries to suggest pulmonary emboli. Heart borderline in size. Scattered coronary artery and aortic calcifications. No evidence of aortic aneurysm. Mediastinum/Nodes: No mediastinal, hilar, or axillary adenopathy. Trachea and esophagus are unremarkable. Thyroid unremarkable. Lungs/Pleura: No confluent opacities or effusions. Minimal dependent and bibasilar atelectasis. Upper Abdomen: No acute findings Musculoskeletal: Chest wall soft tissues are unremarkable. No acute bony abnormality. Review of the MIP images confirms the above findings. IMPRESSION: No evidence of pulmonary embolus. No acute cardiopulmonary disease. Borderline heart size.  Scattered coronary artery calcifications. Aortic Atherosclerosis (ICD10-I70.0). Electronically Signed   By: Charlett Nose M.D.   On: 03/21/2023 20:27   (Echo, Carotid, EGD, Colonoscopy, ERCP)    Subjective: Pt c/o fatigue    Discharge Exam: Vitals:   03/23/23 0422 03/23/23 0818  BP: (!) 124/55 116/76  Pulse: 75 74  Resp: 16 16  Temp: 98.2 F (36.8 C) 97.8 F (36.6 C)  SpO2: 96% 97%   Vitals:  03/22/23 1655 03/22/23 1935 03/23/23 0422 03/23/23 0818  BP: 117/62 127/67 (!) 124/55 116/76  Pulse: (!) 55 71 75 74  Resp: 16 18 16 16   Temp: 98.1 F (36.7 C) 98 F (36.7 C) 98.2 F (36.8 C) 97.8 F (36.6 C)  TempSrc: Oral  Oral Oral  SpO2: 98% 95% 96% 97%  Weight:      Height:        General: Pt is alert, awake, not in acute distress Cardiovascular:  S1/S2 +, no rubs, no gallops Respiratory:  CTA bilaterally, no wheezing, no rhonchi Abdominal: Soft, NT, obese, bowel sounds + Extremities: no edema, no cyanosis    The results of significant diagnostics from this hospitalization (including imaging, microbiology, ancillary and laboratory) are listed below for reference.     Microbiology: Recent Results (from the past 240 hour(s))  Gastrointestinal Panel by PCR , Stool     Status: None   Collection Time: 03/22/23  9:58 AM   Specimen: Stool  Result Value Ref Range Status   Campylobacter species NOT DETECTED NOT DETECTED Final   Plesimonas shigelloides NOT DETECTED NOT DETECTED Final   Salmonella species NOT DETECTED NOT DETECTED Final   Yersinia enterocolitica NOT DETECTED NOT DETECTED Final   Vibrio species NOT DETECTED NOT DETECTED Final   Vibrio cholerae NOT DETECTED NOT DETECTED Final   Enteroaggregative E coli (EAEC) NOT DETECTED NOT DETECTED Final   Enteropathogenic E coli (EPEC) NOT DETECTED NOT DETECTED Final   Enterotoxigenic E coli (ETEC) NOT DETECTED NOT DETECTED Final   Shiga like toxin producing E coli (STEC) NOT DETECTED NOT DETECTED Final   Shigella/Enteroinvasive E coli (EIEC) NOT DETECTED NOT DETECTED Final   Cryptosporidium NOT DETECTED NOT DETECTED Final   Cyclospora cayetanensis NOT DETECTED NOT DETECTED Final   Entamoeba histolytica NOT DETECTED NOT DETECTED Final   Giardia lamblia NOT DETECTED NOT DETECTED Final   Adenovirus F40/41 NOT DETECTED NOT DETECTED Final   Astrovirus NOT DETECTED NOT DETECTED Final   Norovirus GI/GII NOT DETECTED NOT DETECTED Final   Rotavirus A NOT DETECTED NOT DETECTED Final   Sapovirus (I, II, IV, and V) NOT DETECTED NOT DETECTED Final    Comment: Performed at [facility], [address] [place], [place] [address]     Labs: BNP (last 3 results) Recent Labs    03/21/23 1830  BNP 22.4   Basic Metabolic Panel: Recent Labs  Lab 03/21/23 1830 03/22/23 0434 03/23/23 0858  NA 137 139 139  K 3.5 3.9 4.0   CL 105 110 112*  CO2 22 20* 22  GLUCOSE 124* 136* 95  BUN 16 14 9   CREATININE 0.79 0.61 0.78  CALCIUM 9.3 8.3* 8.7*  MG 2.0  --   --    Liver Function Tests: Recent Labs  Lab 03/21/23 1830  AST 19  ALT 18  ALKPHOS 59  BILITOT 0.7  PROT 7.8  ALBUMIN 4.5   Recent Labs  Lab 03/21/23 1830  LIPASE 29   No results for input(s): "AMMONIA" in the last 168 hours. CBC: Recent Labs  Lab 03/21/23 1830 03/22/23 0434  WBC 9.9 9.3  NEUTROABS 6.3  --   HGB 14.5 12.9  HCT 42.1 38.2  MCV 87.5 88.8  PLT 311 219   Cardiac Enzymes: No results for input(s): "CKTOTAL", "CKMB", "CKMBINDEX", "TROPONINI" in the last 168 hours. BNP: Invalid input(s): "POCBNP" CBG: Recent Labs  Lab 03/22/23 0730  GLUCAP 108*   D-Dimer No results for input(s): "DDIMER" in the last 72  hours. Hgb A1c No results for input(s): "HGBA1C" in the last 72 hours. Lipid Profile No results for input(s): "CHOL", "HDL", "LDLCALC", "TRIG", "CHOLHDL", "LDLDIRECT" in the last 72 hours. Thyroid function studies No results for input(s): "TSH", "T4TOTAL", "T3FREE", "THYROIDAB" in the last 72 hours.  Invalid input(s): "FREET3" Anemia work up No results for input(s): "VITAMINB12", "FOLATE", "FERRITIN", "TIBC", "IRON", "RETICCTPCT" in the last 72 hours. Urinalysis    Component Value Date/Time   COLORURINE YELLOW (A) 03/15/2022 1813   APPEARANCEUR CLEAR (A) 03/15/2022 1813   APPEARANCEUR Clear 03/20/2019 1126   LABSPEC 1.008 03/15/2022 1813   LABSPEC 1.006 08/23/2012 1638   PHURINE 5.0 03/15/2022 1813   GLUCOSEU NEGATIVE 03/15/2022 1813   GLUCOSEU Negative 08/23/2012 1638   HGBUR SMALL (A) 03/15/2022 1813   BILIRUBINUR NEGATIVE 03/15/2022 1813   BILIRUBINUR Negative 03/20/2019 1126   BILIRUBINUR Negative 08/23/2012 1638   KETONESUR NEGATIVE 03/15/2022 1813   PROTEINUR NEGATIVE 03/15/2022 1813   NITRITE NEGATIVE 03/15/2022 1813   LEUKOCYTESUR MODERATE (A) 03/15/2022 1813   LEUKOCYTESUR 3+ 08/23/2012 1638    Sepsis Labs Recent Labs  Lab 03/21/23 1830 03/22/23 0434  WBC 9.9 9.3   Microbiology Recent Results (from the past 240 hour(s))  Gastrointestinal Panel by PCR , Stool     Status: None   Collection Time: 03/22/23  9:58 AM   Specimen: Stool  Result Value Ref Range Status   Campylobacter species NOT DETECTED NOT DETECTED Final   Plesimonas shigelloides NOT DETECTED NOT DETECTED Final   Salmonella species NOT DETECTED NOT DETECTED Final   Yersinia enterocolitica NOT DETECTED NOT DETECTED Final   Vibrio species NOT DETECTED NOT DETECTED Final   Vibrio cholerae NOT DETECTED NOT DETECTED Final   Enteroaggregative E coli (EAEC) NOT DETECTED NOT DETECTED Final   Enteropathogenic E coli (EPEC) NOT DETECTED NOT DETECTED Final   Enterotoxigenic E coli (ETEC) NOT DETECTED NOT DETECTED Final   Shiga like toxin producing E coli (STEC) NOT DETECTED NOT DETECTED Final   Shigella/Enteroinvasive E coli (EIEC) NOT DETECTED NOT DETECTED Final   Cryptosporidium NOT DETECTED NOT DETECTED Final   Cyclospora cayetanensis NOT DETECTED NOT DETECTED Final   Entamoeba histolytica NOT DETECTED NOT DETECTED Final   Giardia lamblia NOT DETECTED NOT DETECTED Final   Adenovirus F40/41 NOT DETECTED NOT DETECTED Final   Astrovirus NOT DETECTED NOT DETECTED Final   Norovirus GI/GII NOT DETECTED NOT DETECTED Final   Rotavirus A NOT DETECTED NOT DETECTED Final   Sapovirus (I, II, IV, and V) NOT DETECTED NOT DETECTED Final    Comment: Performed at [facility], [address], [place], [place] [address]     Time coordinating discharge: Over 30 minutes  SIGNED:   Charise Killian, MD  [facility] 03/23/2023, 12:43 PM Pager   If 7PM-7AM, please contact night-coverage [email or web address]

## 2023-03-23 NOTE — Plan of Care (Signed)

## 2023-08-31 ENCOUNTER — Inpatient Hospital Stay (HOSPITAL_BASED_OUTPATIENT_CLINIC_OR_DEPARTMENT_OTHER): Payer: Medicare Other | Admitting: Internal Medicine

## 2023-08-31 ENCOUNTER — Encounter: Payer: Self-pay | Admitting: Internal Medicine

## 2023-08-31 ENCOUNTER — Inpatient Hospital Stay: Payer: Medicare Other | Attending: Internal Medicine

## 2023-08-31 VITALS — BP 127/83 | HR 76 | Temp 97.9°F | Resp 16 | Wt 170.0 lb

## 2023-08-31 DIAGNOSIS — Z9221 Personal history of antineoplastic chemotherapy: Secondary | ICD-10-CM | POA: Diagnosis not present

## 2023-08-31 DIAGNOSIS — Z8572 Personal history of non-Hodgkin lymphomas: Secondary | ICD-10-CM | POA: Insufficient documentation

## 2023-08-31 DIAGNOSIS — C8203 Follicular lymphoma grade I, intra-abdominal lymph nodes: Secondary | ICD-10-CM

## 2023-08-31 LAB — CBC WITH DIFFERENTIAL (CANCER CENTER ONLY)
Abs Immature Granulocytes: 0.02 10*3/uL (ref 0.00–0.07)
Basophils Absolute: 0 10*3/uL (ref 0.0–0.1)
Basophils Relative: 0 %
Eosinophils Absolute: 0.1 10*3/uL (ref 0.0–0.5)
Eosinophils Relative: 2 %
HCT: 39.2 % (ref 36.0–46.0)
Hemoglobin: 13.3 g/dL (ref 12.0–15.0)
Immature Granulocytes: 0 %
Lymphocytes Relative: 44 %
Lymphs Abs: 2.5 10*3/uL (ref 0.7–4.0)
MCH: 30 pg (ref 26.0–34.0)
MCHC: 33.9 g/dL (ref 30.0–36.0)
MCV: 88.5 fL (ref 80.0–100.0)
Monocytes Absolute: 0.4 10*3/uL (ref 0.1–1.0)
Monocytes Relative: 7 %
Neutro Abs: 2.6 10*3/uL (ref 1.7–7.7)
Neutrophils Relative %: 47 %
Platelet Count: 259 10*3/uL (ref 150–400)
RBC: 4.43 MIL/uL (ref 3.87–5.11)
RDW: 13.4 % (ref 11.5–15.5)
WBC Count: 5.6 10*3/uL (ref 4.0–10.5)
nRBC: 0 % (ref 0.0–0.2)

## 2023-08-31 LAB — CMP (CANCER CENTER ONLY)
ALT: 24 U/L (ref 0–44)
AST: 24 U/L (ref 15–41)
Albumin: 4.1 g/dL (ref 3.5–5.0)
Alkaline Phosphatase: 54 U/L (ref 38–126)
Anion gap: 8 (ref 5–15)
BUN: 16 mg/dL (ref 8–23)
CO2: 23 mmol/L (ref 22–32)
Calcium: 9.2 mg/dL (ref 8.9–10.3)
Chloride: 103 mmol/L (ref 98–111)
Creatinine: 0.71 mg/dL (ref 0.44–1.00)
GFR, Estimated: >60 mL/min (ref >60)
Glucose, Bld: 146 mg/dL — ABNORMAL HIGH (ref 70–99)
Potassium: 3.5 mmol/L (ref 3.5–5.1)
Sodium: 134 mmol/L — ABNORMAL LOW (ref 135–145)
Total Bilirubin: 0.4 mg/dL (ref 0.0–1.2)
Total Protein: 7.2 g/dL (ref 6.5–8.1)

## 2023-08-31 LAB — LACTATE DEHYDROGENASE: LDH: 108 U/L (ref 98–192)

## 2023-08-31 NOTE — Assessment & Plan Note (Addendum)
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  CT scan FEB 2023 [PSBO]-negative for any lymphadenopathy-  stable.   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   # Shingles- s/p valacyclovir-. Recommend shingles vaccination/PCP.    # Random BG- 144- ? Pre-diabetes- defer to PCP; next visit-fasting  # Disposition:  # follow-up 6 m-MD /lab-CBC CMP [facility]- Dr.B

## 2023-08-31 NOTE — Progress Notes (Signed)
 [facility] OFFICE PROGRESS NOTE  Patient Care Team: Delmus Ferri, MD as PCP - General (Physician Assistant) Gwyn Leos, MD as Consulting Physician (Hematology and Oncology)   Cancer Staging  No matching staging information was found for the patient.     Oncology History Overview Note    1.FEB 2013-  MESENTERIC LYMPHADENOPATHY;] Pet positive lymphadenopathy.; . 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 3. Rituxan weekly x4 finished in March of 2013 4. PET scan in July of 2013 complete remission. 5. Maintenance Rituxan started in July of 2013;  x 2 years [finished 2015]  #April 2019-melanoma/left side of the nose [2020]/ Left arm [2021][Dr. Kowalski/Dr.Dasher]   # LFTs- slightly up/ ? faty liver.  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  Ambulating independently.  83 year-old patient above history for follow-up of lymphoma status post Rituxan maintenance was completed in 2015 is here for follow-up.  Recently dx with shingles. Also recently Dx with UTI on anti-biotics.   In the interim patient denies any new lymph nodes.  Denies any night sweats.  Denies any fevers or chills.Aaron Aas  REVIEW OF SYSTEMS:  Review of Systems  Constitutional: Negative.  Negative for chills, fever, malaise/fatigue and weight loss.  HENT:  Negative for congestion, ear pain and tinnitus.   Eyes: Negative.  Negative for blurred vision and double vision.  Respiratory: Negative.  Negative for cough, sputum production and shortness of breath.   Cardiovascular: Negative.  Negative for chest pain, palpitations and leg swelling.  Gastrointestinal: Negative.  Negative for abdominal pain,  constipation, diarrhea, nausea and vomiting.  Musculoskeletal:  Positive for back pain and joint pain. Negative for falls.  Neurological: Negative.  Negative for weakness and headaches.  Endo/Heme/Allergies: Negative.  Does not bruise/bleed easily.  Psychiatric/Behavioral: Negative.  Negative for depression. The patient is not nervous/anxious and does not have insomnia.    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   Hyperlipidemia    Hypertension    Melanoma (HCC)    Migraine    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   Non Hodgkin's lymphoma (HCC)    Non Hodgkin's lymphoma (HCC)    RAD (reactive airway disease)    Skin cancer     PAST SURGICAL HISTORY :   Past Surgical History:  Procedure Laterality Date   ABDOMINAL HYSTERECTOMY     APPENDECTOMY     BASAL CELL CARCINOMA EXCISION     on both sides of nose   BREAST BIOPSY Left 12/2013   hyperplasia   CATARACT EXTRACTION W/PHACO Right 01/10/2022   Procedure: CATARACT EXTRACTION PHACO AND INTRAOCULAR LENS PLACEMENT (IOC) RIGHT;  Surgeon: Clair Crews, MD;  Location: [facility];  Service: Ophthalmology;  Laterality:  Right;  7.85 0:38.1   CATARACT EXTRACTION W/PHACO Left 01/31/2022   Procedure: CATARACT EXTRACTION PHACO AND INTRAOCULAR LENS PLACEMENT (IOC) LEFT 8.45 00:46.1;  Surgeon: Clair Crews, MD;  Location: [facility];  Service: Ophthalmology;  Laterality: Left;   CHOLECYSTECTOMY     COLONOSCOPY N/A 09/16/2021   Procedure: COLONOSCOPY;  Surgeon: Marnee Sink, MD;  Location: [facility];  Service:  Endoscopy;  Laterality: N/A;   COLONOSCOPY WITH PROPOFOL  N/A 06/05/2017   Procedure: COLONOSCOPY WITH PROPOFOL ;  Surgeon: Deveron Fly, MD;  Location: [facility] ENDOSCOPY;  Service: Endoscopy;  Laterality: N/A;   COLONOSCOPY WITH PROPOFOL  N/A 05/14/2020   Procedure: COLONOSCOPY WITH PROPOFOL ;  Surgeon: Shane Darling, MD;  Location: [facility] ENDOSCOPY;  Service: Endoscopy;  Laterality: N/A;   ESOPHAGOGASTRODUODENOSCOPY (EGD) WITH PROPOFOL  N/A 03/30/2015   Procedure: ESOPHAGOGASTRODUODENOSCOPY (EGD) WITH PROPOFOL ;  Surgeon: Deveron Fly, MD;  Location: [facility] ENDOSCOPY;  Service: Endoscopy;  Laterality: N/A;   ESOPHAGOGASTRODUODENOSCOPY (EGD) WITH PROPOFOL  N/A 06/05/2017   Procedure: ESOPHAGOGASTRODUODENOSCOPY (EGD) WITH PROPOFOL ;  Surgeon: Deveron Fly, MD;  Location: [facility] ENDOSCOPY;  Service: Endoscopy;  Laterality: N/A;   ESOPHAGOGASTRODUODENOSCOPY (EGD) WITH PROPOFOL  N/A 05/14/2020   Procedure: ESOPHAGOGASTRODUODENOSCOPY (EGD) WITH PROPOFOL ;  Surgeon: Shane Darling, MD;  Location: [facility] ENDOSCOPY;  Service: Endoscopy;  Laterality: N/A;   FACIAL COSMETIC SURGERY     history of multiple colonic polyps     lymph node removal from left groin     OOPHORECTOMY     VENTRAL HERNIA REPAIR N/A 10/02/2014   Procedure: HERNIA REPAIR VENTRAL ADULT;  Surgeon: Benancio Bracket, MD;  Location: [facility] ORS;  Service: General;  Laterality: N/A;  with mesh    FAMILY HISTORY :   Family History  Problem Relation Age of Onset   Alcohol abuse Father    Stroke Father    Diabetes Sister    Kidney cancer Sister    Melanoma Brother    Diabetes Brother    Heart attack Brother    Breast cancer Neg Hx    Bladder Cancer Neg Hx    Prostate cancer Neg Hx     SOCIAL HISTORY:   Social History   Tobacco Use   Smoking status: Never   Smokeless tobacco: Never  Vaping Use   Vaping status: Never Used  Substance Use Topics   Alcohol use: Not Currently    Alcohol/week: 1.0 standard drink of alcohol     Types: 1 Cans of beer per week    Comment: rare social occasions only   Drug use: Never    ALLERGIES:  is allergic to buprenorphine hcl, dilaudid  [hydromorphone  hcl], and morphine  and codeine.  MEDICATIONS:  Current Outpatient Medications  Medication Sig Dispense Refill   diphenhydrAMINE (BENADRYL) 25 MG tablet Take 25 mg by mouth at bedtime as needed for sleep.     lisinopril  (PRINIVIL ,ZESTRIL ) 10 MG tablet Take 10 mg by mouth daily.     nitrofurantoin , macrocrystal-monohydrate, (MACROBID ) 100 MG capsule Take 1 capsule by mouth 2 (two) times daily.     omeprazole (PRILOSEC) 20 MG capsule Take 20 mg by mouth daily.     Probiotic Product (PROBIOTIC-10) CHEW Chew 1 tablet by mouth daily.     rosuvastatin  (CRESTOR ) 5 MG tablet Take 5 mg by mouth daily.     valACYclovir (VALTREX) 1000 MG tablet Take 1,000 mg by mouth 3 (three) times daily.     No current facility-administered medications for this visit.    PHYSICAL EXAMINATION: ECOG PERFORMANCE STATUS: 0 -  Asymptomatic  BP 127/83 (BP Location: Right Arm, Patient Position: Sitting)   Pulse 76   Temp 97.9 F (36.6 C) (Tympanic)   Resp 16   Wt 170 lb (77.1 kg)   SpO2 99%   BMI 31.09 kg/m   Filed Weights   08/31/23 1410  Weight: 170 lb (77.1 kg)   Physical Exam Constitutional:      Comments: Obese.  Walk independently.  HENT:     Head: Normocephalic and atraumatic.     Mouth/Throat:     Pharynx: No oropharyngeal exudate.  Eyes:     Pupils: Pupils are equal, round, and reactive to light.  Cardiovascular:     Rate and Rhythm: Normal rate and regular rhythm.  Pulmonary:     Effort: Pulmonary effort is normal. No respiratory distress.     Breath sounds: Normal breath sounds. No wheezing.  Abdominal:     General: Bowel sounds are normal. There is no distension.     Palpations: Abdomen is soft. There is no mass.     Tenderness: There is no abdominal tenderness. There is no guarding or rebound.  Musculoskeletal:         General: No tenderness. Normal range of motion.     Cervical back: Normal range of motion and neck supple.  Skin:    General: Skin is warm.  Neurological:     Mental Status: She is alert and oriented to person, place, and time.  Psychiatric:        Mood and Affect: Affect normal.    LABORATORY DATA:  I have reviewed the data as listed    Component Value Date/Time   NA 134 (L) 08/31/2023 1353   NA 141 03/03/2014 1022   K 3.5 08/31/2023 1353   K 4.1 03/03/2014 1022   CL 103 08/31/2023 1353   CL 105 03/03/2014 1022   CO2 23 08/31/2023 1353   CO2 28 03/03/2014 1022   GLUCOSE 146 (H) 08/31/2023 1353   GLUCOSE 112 (H) 03/03/2014 1022   BUN 16 08/31/2023 1353   BUN 18 03/03/2014 1022   CREATININE 0.71 08/31/2023 1353   CREATININE 0.94 03/03/2014 1022   CALCIUM  9.2 08/31/2023 1353   CALCIUM  9.4 03/03/2014 1022   PROT 7.2 08/31/2023 1353   PROT 7.4 03/03/2014 1022   ALBUMIN 4.1 08/31/2023 1353   ALBUMIN 4.1 03/03/2014 1022   AST 24 08/31/2023 1353   ALT 24 08/31/2023 1353   ALT 53 03/03/2014 1022   ALKPHOS 54 08/31/2023 1353   ALKPHOS 81 03/03/2014 1022   BILITOT 0.4 08/31/2023 1353   GFRNONAA >60 08/31/2023 1353   GFRNONAA >60 03/03/2014 1022   GFRNONAA >60 09/02/2013 0939   GFRAA >60 08/21/2019 1031   GFRAA >60 03/03/2014 1022   GFRAA >60 09/02/2013 0939    No results found for: "SPEP", "UPEP"  Lab Results  Component Value Date   WBC 5.6 08/31/2023   NEUTROABS 2.6 08/31/2023   HGB 13.3 08/31/2023   HCT 39.2 08/31/2023   MCV 88.5 08/31/2023   PLT 259 08/31/2023      Chemistry      Component Value Date/Time   NA 134 (L) 08/31/2023 1353   NA 141 03/03/2014 1022   K 3.5 08/31/2023 1353   K 4.1 03/03/2014 1022   CL 103 08/31/2023 1353   CL 105 03/03/2014 1022   CO2 23 08/31/2023 1353   CO2 28 03/03/2014 1022   BUN 16 08/31/2023 1353   BUN 18 03/03/2014 1022  CREATININE 0.71 08/31/2023 1353   CREATININE 0.94 03/03/2014 1022      Component Value  Date/Time   CALCIUM  9.2 08/31/2023 1353   CALCIUM  9.4 03/03/2014 1022   ALKPHOS 54 08/31/2023 1353   ALKPHOS 81 03/03/2014 1022   AST 24 08/31/2023 1353   ALT 24 08/31/2023 1353   ALT 53 03/03/2014 1022   BILITOT 0.4 08/31/2023 1353       RADIOGRAPHIC STUDIES: I have personally reviewed the radiological images as listed and agreed with the findings in the report. No results found.   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  CT scan FEB 2023 [[facility]]-negative for any lymphadenopathy-  stable.   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   # Shingles- s/p valacyclovir-. Recommend shingles vaccination/PCP.    # Random BG- 144- ? Pre-diabetes- defer to PCP; next visit-fasting  # Disposition:  # follow-up 6 m-MD /lab-CBC CMP [facility]- Dr.B   Orders Placed This Encounter  Procedures   CBC with Differential (Cancer Center Only)    Standing Status:   Future    Expected Date:   02/29/2024    Expiration Date:   08/30/2024   CMP (Cancer Center only)    Standing Status:   Future    Expected Date:   02/29/2024    Expiration Date:   08/30/2024   Lactate dehydrogenase    Standing Status:   Future    Expected Date:   02/29/2024    Expiration Date:   08/30/2024     Gwyn Leos, MD 08/31/2023 3:03 PM

## 2024-01-21 ENCOUNTER — Inpatient Hospital Stay: Attending: Internal Medicine

## 2024-01-21 ENCOUNTER — Encounter: Payer: Self-pay | Admitting: Internal Medicine

## 2024-01-21 ENCOUNTER — Inpatient Hospital Stay (HOSPITAL_BASED_OUTPATIENT_CLINIC_OR_DEPARTMENT_OTHER): Admitting: Internal Medicine

## 2024-01-21 VITALS — BP 121/75 | HR 78 | Temp 97.4°F | Resp 16 | Ht 62.0 in | Wt 169.1 lb

## 2024-01-21 DIAGNOSIS — Z808 Family history of malignant neoplasm of other organs or systems: Secondary | ICD-10-CM | POA: Diagnosis not present

## 2024-01-21 DIAGNOSIS — E785 Hyperlipidemia, unspecified: Secondary | ICD-10-CM | POA: Insufficient documentation

## 2024-01-21 DIAGNOSIS — Z90721 Acquired absence of ovaries, unilateral: Secondary | ICD-10-CM | POA: Diagnosis not present

## 2024-01-21 DIAGNOSIS — C8203 Follicular lymphoma grade I, intra-abdominal lymph nodes: Secondary | ICD-10-CM

## 2024-01-21 DIAGNOSIS — Z8249 Family history of ischemic heart disease and other diseases of the circulatory system: Secondary | ICD-10-CM | POA: Diagnosis not present

## 2024-01-21 DIAGNOSIS — B029 Zoster without complications: Secondary | ICD-10-CM | POA: Insufficient documentation

## 2024-01-21 DIAGNOSIS — M2550 Pain in unspecified joint: Secondary | ICD-10-CM | POA: Insufficient documentation

## 2024-01-21 DIAGNOSIS — L989 Disorder of the skin and subcutaneous tissue, unspecified: Secondary | ICD-10-CM | POA: Insufficient documentation

## 2024-01-21 DIAGNOSIS — M549 Dorsalgia, unspecified: Secondary | ICD-10-CM | POA: Diagnosis not present

## 2024-01-21 DIAGNOSIS — K56600 Partial intestinal obstruction, unspecified as to cause: Secondary | ICD-10-CM | POA: Diagnosis not present

## 2024-01-21 DIAGNOSIS — Z860100 Personal history of colon polyps, unspecified: Secondary | ICD-10-CM | POA: Diagnosis not present

## 2024-01-21 DIAGNOSIS — Z811 Family history of alcohol abuse and dependence: Secondary | ICD-10-CM | POA: Diagnosis not present

## 2024-01-21 DIAGNOSIS — Z90710 Acquired absence of both cervix and uterus: Secondary | ICD-10-CM | POA: Diagnosis not present

## 2024-01-21 DIAGNOSIS — M1990 Unspecified osteoarthritis, unspecified site: Secondary | ICD-10-CM | POA: Diagnosis not present

## 2024-01-21 DIAGNOSIS — Z85820 Personal history of malignant melanoma of skin: Secondary | ICD-10-CM | POA: Insufficient documentation

## 2024-01-21 DIAGNOSIS — Z823 Family history of stroke: Secondary | ICD-10-CM | POA: Insufficient documentation

## 2024-01-21 DIAGNOSIS — Z79899 Other long term (current) drug therapy: Secondary | ICD-10-CM | POA: Insufficient documentation

## 2024-01-21 DIAGNOSIS — Z9049 Acquired absence of other specified parts of digestive tract: Secondary | ICD-10-CM | POA: Diagnosis not present

## 2024-01-21 DIAGNOSIS — Z885 Allergy status to narcotic agent status: Secondary | ICD-10-CM | POA: Diagnosis not present

## 2024-01-21 DIAGNOSIS — Z8051 Family history of malignant neoplasm of kidney: Secondary | ICD-10-CM | POA: Diagnosis not present

## 2024-01-21 DIAGNOSIS — Z8719 Personal history of other diseases of the digestive system: Secondary | ICD-10-CM | POA: Insufficient documentation

## 2024-01-21 LAB — CMP (CANCER CENTER ONLY)
ALT: 19 U/L (ref 0–44)
AST: 22 U/L (ref 15–41)
Albumin: 4.1 g/dL (ref 3.5–5.0)
Alkaline Phosphatase: 55 U/L (ref 38–126)
Anion gap: 9 (ref 5–15)
BUN: 21 mg/dL (ref 8–23)
CO2: 25 mmol/L (ref 22–32)
Calcium: 9.3 mg/dL (ref 8.9–10.3)
Chloride: 103 mmol/L (ref 98–111)
Creatinine: 0.87 mg/dL (ref 0.44–1.00)
GFR, Estimated: >60 mL/min (ref >60)
Glucose, Bld: 139 mg/dL — ABNORMAL HIGH (ref 70–99)
Potassium: 3.7 mmol/L (ref 3.5–5.1)
Sodium: 137 mmol/L (ref 135–145)
Total Bilirubin: 0.4 mg/dL (ref 0.0–1.2)
Total Protein: 7.1 g/dL (ref 6.5–8.1)

## 2024-01-21 LAB — CBC WITH DIFFERENTIAL (CANCER CENTER ONLY)
Abs Immature Granulocytes: 0.04 K/uL (ref 0.00–0.07)
Basophils Absolute: 0 K/uL (ref 0.0–0.1)
Basophils Relative: 0 %
Eosinophils Absolute: 0.1 K/uL (ref 0.0–0.5)
Eosinophils Relative: 1 %
HCT: 41.8 % (ref 36.0–46.0)
Hemoglobin: 14 g/dL (ref 12.0–15.0)
Immature Granulocytes: 1 %
Lymphocytes Relative: 40 %
Lymphs Abs: 2.7 K/uL (ref 0.7–4.0)
MCH: 29.8 pg (ref 26.0–34.0)
MCHC: 33.5 g/dL (ref 30.0–36.0)
MCV: 88.9 fL (ref 80.0–100.0)
Monocytes Absolute: 0.5 K/uL (ref 0.1–1.0)
Monocytes Relative: 7 %
Neutro Abs: 3.4 K/uL (ref 1.7–7.7)
Neutrophils Relative %: 51 %
Platelet Count: 273 K/uL (ref 150–400)
RBC: 4.7 MIL/uL (ref 3.87–5.11)
RDW: 12.9 % (ref 11.5–15.5)
WBC Count: 6.7 K/uL (ref 4.0–10.5)
nRBC: 0 % (ref 0.0–0.2)

## 2024-01-21 LAB — LACTATE DEHYDROGENASE: LDH: 113 U/L (ref 98–192)

## 2024-01-21 NOTE — Assessment & Plan Note (Addendum)
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  CT scan FEB 2023 [[facility]]-negative for any lymphadenopathy; however noted to have knot on her right under-arm-  s/p  resection [Dr.Dasher]-. awaiting pathology. Monitor for now.   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   # Blood in mouth- ? Etiology- 1 episode- monitor for now.   # Shingles- s/p valacyclovir-. Recommend shingles vaccination/PCP.    # Disposition:  # follow-up 6 m-MD /lab-CBC CMP [facility]- Dr.B

## 2024-01-21 NOTE — Progress Notes (Signed)
 Pt states while brushing teeth had blood on her tongue. Bx rt arm done last week, Dr. Dela did bx. Was placed on prednisone and zyrtec for rash, finished, by pcp. She had a neck lymph node swelling but has gone down.  Can she take or alternate benadryl and Claritin  or zyrtec? She likes to take benadryl at bedtime.

## 2024-01-21 NOTE — Progress Notes (Signed)
 [facility] OFFICE PROGRESS NOTE  Patient Care Team: Marikay Eva POUR, PA as PCP - General (Physician Assistant) Rennie Cindy SAUNDERS, MD as Consulting Physician (Hematology and Oncology)   Cancer Staging  No matching staging information was found for the patient.     Oncology History Overview Note    1.FEB 2013-  MESENTERIC LYMPHADENOPATHY;] Pet positive lymphadenopathy.; . 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 3. Rituxan weekly x4 finished in March of 2013 4. PET scan in July of 2013 complete remission. 5. Maintenance Rituxan started in July of 2013;  x 2 years [finished 2015]  #April 2019-melanoma/left side of the nose [2020]/ Left arm [2021][Dr. Kowalski/Dr.Dasher]   # LFTs- slightly up/ ? faty liver.  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  Ambulating independently.  83 year-old patient above history for follow-up of lymphoma status post Rituxan maintenance was completed in 2015 is here for follow-up.  Patient noted to have skin lesion- resection- awaiting pathology.   Pt states while brushing teeth had blood on her tongue. Bx rt arm done last week, Dr. Dela did bx.   Patient was placed on prednisone and zyrtec for rash, finished, by pcp. She had a neck lymph node swelling but has gone down.  In the interim patient denies any new lymph nodes.  Denies any night sweats.  Denies any fevers or chills.SABRA  REVIEW OF SYSTEMS:  Review of Systems  Constitutional: Negative.  Negative for chills, fever, malaise/fatigue and weight loss.  HENT:  Negative for congestion, ear pain and tinnitus.   Eyes: Negative.  Negative for blurred vision and double vision.   Respiratory: Negative.  Negative for cough, sputum production and shortness of breath.   Cardiovascular: Negative.  Negative for chest pain, palpitations and leg swelling.  Gastrointestinal: Negative.  Negative for abdominal pain, constipation, diarrhea, nausea and vomiting.  Musculoskeletal:  Positive for back pain and joint pain. Negative for falls.  Neurological: Negative.  Negative for weakness and headaches.  Endo/Heme/Allergies: Negative.  Does not bruise/bleed easily.  Psychiatric/Behavioral: Negative.  Negative for depression. The patient is not nervous/anxious and does not have insomnia.    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   Hyperlipidemia    Hypertension    Melanoma (HCC)    Migraine    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   Non Hodgkin's lymphoma (HCC)    Non Hodgkin's lymphoma (HCC)    RAD (reactive airway disease)    Skin cancer     PAST SURGICAL HISTORY :   Past Surgical History:  Procedure Laterality Date   ABDOMINAL HYSTERECTOMY     APPENDECTOMY     BASAL CELL CARCINOMA EXCISION     on both sides of  nose   BREAST BIOPSY Left 12/2013   hyperplasia   CATARACT EXTRACTION W/PHACO Right 01/10/2022   Procedure: CATARACT EXTRACTION PHACO AND INTRAOCULAR LENS PLACEMENT (IOC) RIGHT;  Surgeon: Jaye Fallow, MD;  Location: [facility];  Service: Ophthalmology;  Laterality: Right;  7.85 0:38.1   CATARACT EXTRACTION W/PHACO Left 01/31/2022   Procedure: CATARACT EXTRACTION PHACO AND INTRAOCULAR LENS PLACEMENT (IOC) LEFT 8.45 00:46.1;  Surgeon: Jaye Fallow, MD;  Location: [facility] SUR[facility]ERY [facility];  Service: Ophthalmology;  Laterality: Left;   CHOLECYSTECTOMY     COLONOSCOPY N/A 09/16/2021   Procedure: COLONOSCOPY;  Surgeon: Jinny Carmine, MD;  Location: [facility];  Service: Endoscopy;  Laterality: N/A;   COLONOSCOPY WITH PROPOFOL  N/A 06/05/2017   Procedure: COLONOSCOPY WITH PROPOFOL ;  Surgeon: Gaylyn Gladis PENNER, MD;  Location: [facility] ENDOSCOPY;  Service: Endoscopy;  Laterality: N/A;   COLONOSCOPY WITH PROPOFOL  N/A 05/14/2020   Procedure: COLONOSCOPY WITH PROPOFOL ;  Surgeon: Maryruth Ole DASEN, MD;  Location: [facility] ENDOSCOPY;  Service: Endoscopy;  Laterality: N/A;   ESOPHAGOGASTRODUODENOSCOPY (EGD) WITH PROPOFOL  N/A 03/30/2015   Procedure: ESOPHAGOGASTRODUODENOSCOPY (EGD) WITH PROPOFOL ;  Surgeon: Gladis PENNER Gaylyn, MD;  Location: [facility] ENDOSCOPY;  Service: Endoscopy;  Laterality: N/A;   ESOPHAGOGASTRODUODENOSCOPY (EGD) WITH PROPOFOL  N/A 06/05/2017   Procedure: ESOPHAGOGASTRODUODENOSCOPY (EGD) WITH PROPOFOL ;  Surgeon: Gaylyn Gladis PENNER, MD;  Location[facility] ENDOSCOPY;  Service: Endoscopy;  Laterality: N/A;   ESOPHAGOGASTRODUODENOSCOPY (EGD) WITH PROPOFOL  N/A 05/14/2020   Procedure: ESOPHAGOGASTRODUODENOSCOPY (EGD) WITH PROPOFOL ;  Surgeon: Maryruth Ole DASEN, MD;  Location: [facility] ENDOSCOPY;  Service: Endoscopy;  Laterality: N/A;   FACIAL COSMETIC SURGERY     history of multiple colonic polyps     lymph node removal from left groin     OOPHORECTOMY     VENTRAL HERNIA REPAIR N/A 10/02/2014   Procedure: HERNIA REPAIR VENTRAL ADULT;  Surgeon: Larinda Unknown Sharps, MD;  Location: [facility] ORS;  Service: General;  Laterality: N/A;  with mesh    FAMILY HISTORY :   Family History  Problem Relation Age of Onset   Alcohol abuse Father    Stroke Father    Diabetes Sister    Kidney cancer Sister    Melanoma Brother    Diabetes Brother    Heart attack Brother    Breast cancer Neg Hx    Bladder Cancer Neg Hx    Prostate cancer Neg Hx     SOCIAL  HISTORY:   Social History   Tobacco Use   Smoking status: Never   Smokeless tobacco: Never  Vaping Use   Vaping status: Never Used  Substance Use Topics   Alcohol use: Not Currently    Alcohol/week: 1.0 standard drink of alcohol    Types: 1 Cans of beer per week    Comment: rare social occasions only   Drug use: Never    ALLERGIES:  is allergic to buprenorphine hcl, dilaudid  [hydromorphone  hcl], and morphine  and codeine.  MEDICATIONS:  Current Outpatient Medications  Medication Sig Dispense Refill   lisinopril  (PRINIVIL ,ZESTRIL ) 10 MG tablet Take 10 mg by mouth daily.     omeprazole (PRILOSEC) 20 MG capsule Take 20 mg by mouth daily.     rosuvastatin  (CRESTOR ) 5 MG tablet Take 5 mg by mouth daily.     valACYclovir (VALTREX) 1000 MG tablet Take 1,000 mg by mouth 3 (three) times daily.     No current facility-administered medications for this visit.    PHYSICAL EXAMINATION: ECOG PERFORMANCE STATUS: 0 - Asymptomatic  BP 121/75 (BP Location: Left Arm, Patient  Position: Sitting, Cuff Size: Large)   Pulse 78   Temp (!) 97.4 F (36.3 C) (Tympanic)   Resp 16   Ht 5' 2 (1.575 m)   Wt 169 lb 1.6 oz (76.7 kg)   SpO2 95%   BMI 30.93 kg/m   Filed Weights   01/21/24 1332  Weight: 169 lb 1.6 oz (76.7 kg)   Physical Exam Constitutional:      Comments: Obese.  Walk independently.  HENT:     Head: Normocephalic and atraumatic.     Mouth/Throat:     Pharynx: No oropharyngeal exudate.  Eyes:     Pupils: Pupils are equal, round, and reactive to light.  Cardiovascular:     Rate and Rhythm: Normal rate and regular rhythm.  Pulmonary:     Effort: Pulmonary effort is normal. No respiratory distress.     Breath sounds: Normal breath sounds. No wheezing.  Abdominal:     General: Bowel sounds are normal. There is no distension.     Palpations: Abdomen is soft. There is no mass.     Tenderness: There is no abdominal tenderness. There is no guarding or rebound.  Musculoskeletal:         General: No tenderness. Normal range of motion.     Cervical back: Normal range of motion and neck supple.  Skin:    General: Skin is warm.  Neurological:     Mental Status: She is alert and oriented to person, place, and time.  Psychiatric:        Mood and Affect: Affect normal.    LABORATORY DATA:  I have reviewed the data as listed    Component Value Date/Time   NA 137 01/21/2024 1327   NA 141 03/03/2014 1022   K 3.7 01/21/2024 1327   K 4.1 03/03/2014 1022   CL 103 01/21/2024 1327   CL 105 03/03/2014 1022   CO2 25 01/21/2024 1327   CO2 28 03/03/2014 1022   GLUCOSE 139 (H) 01/21/2024 1327   GLUCOSE 112 (H) 03/03/2014 1022   BUN 21 01/21/2024 1327   BUN 18 03/03/2014 1022   CREATININE 0.87 01/21/2024 1327   CREATININE 0.94 03/03/2014 1022   CALCIUM  9.3 01/21/2024 1327   CALCIUM  9.4 03/03/2014 1022   PROT 7.1 01/21/2024 1327   PROT 7.4 03/03/2014 1022   ALBUMIN 4.1 01/21/2024 1327   ALBUMIN 4.1 03/03/2014 1022   AST 22 01/21/2024 1327   ALT 19 01/21/2024 1327   ALT 53 03/03/2014 1022   ALKPHOS 55 01/21/2024 1327   ALKPHOS 81 03/03/2014 1022   BILITOT 0.4 01/21/2024 1327   GFRNONAA >60 01/21/2024 1327   GFRNONAA >60 03/03/2014 1022   GFRNONAA >60 09/02/2013 0939   GFRAA >60 08/21/2019 1031   GFRAA >60 03/03/2014 1022   GFRAA >60 09/02/2013 0939    No results found for: SPEP, UPEP  Lab Results  Component Value Date   WBC 6.7 01/21/2024   NEUTROABS 3.4 01/21/2024   HGB 14.0 01/21/2024   HCT 41.8 01/21/2024   MCV 88.9 01/21/2024   PLT 273 01/21/2024      Chemistry      Component Value Date/Time   NA 137 01/21/2024 1327   NA 141 03/03/2014 1022   K 3.7 01/21/2024 1327   K 4.1 03/03/2014 1022   CL 103 01/21/2024 1327   CL 105 03/03/2014 1022   CO2 25 01/21/2024 1327   CO2 28 03/03/2014 1022   BUN 21 01/21/2024 1327   BUN 18  03/03/2014 1022   CREATININE 0.87 01/21/2024 1327   CREATININE 0.94 03/03/2014 1022      Component Value Date/Time    CALCIUM  9.3 01/21/2024 1327   CALCIUM  9.4 03/03/2014 1022   ALKPHOS 55 01/21/2024 1327   ALKPHOS 81 03/03/2014 1022   AST 22 01/21/2024 1327   ALT 19 01/21/2024 1327   ALT 53 03/03/2014 1022   BILITOT 0.4 01/21/2024 1327       RADIOGRAPHIC STUDIES: I have personally reviewed the radiological images as listed and agreed with the findings in the report. No results found.   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  CT scan FEB 2023 [[facility]]-negative for any lymphadenopathy; however noted to have knot on her right under-arm-  s/p  resection [Dr.Dasher]- awaiting pathology. Monitor for now.   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   # Blood in mouth- ? Etiology- 1 episode- monitor for now.   # Shingles- s/p valacyclovir-. Recommend shingles vaccination/PCP.    # Disposition:  # follow-up 6 m-MD /lab-CBC CMP [facility]- Dr.B   Orders Placed This Encounter  Procedures   CBC with Differential (Cancer Center Only)    Standing Status:   Future    Expected Date:   01/21/2024    Expiration Date:   01/20/2025   CMP (Cancer Center only)    Standing Status:   Future    Expiration Date:   01/20/2025   Lactate dehydrogenase    Standing Status:   Future    Expiration Date:   01/20/2025     Cindy JONELLE Joe, MD 01/21/2024 2:31 PM

## 2024-02-29 ENCOUNTER — Other Ambulatory Visit

## 2024-02-29 ENCOUNTER — Ambulatory Visit: Admitting: Internal Medicine

## 2024-07-22 ENCOUNTER — Ambulatory Visit: Admitting: Internal Medicine

## 2024-07-22 ENCOUNTER — Other Ambulatory Visit
